# Patient Record
Sex: Male | Born: 1935 | Race: White | Hispanic: No | Marital: Married | Smoking: Former smoker
Health system: Southern US, Community
[De-identification: ages and names within clinical notes are randomized; demographics above are authoritative.]

## PROBLEM LIST (undated history)

## (undated) DIAGNOSIS — L930 Discoid lupus erythematosus: Secondary | ICD-10-CM

## (undated) DIAGNOSIS — E663 Overweight: Secondary | ICD-10-CM

## (undated) DIAGNOSIS — G5710 Meralgia paresthetica, unspecified lower limb: Secondary | ICD-10-CM

## (undated) DIAGNOSIS — N200 Calculus of kidney: Secondary | ICD-10-CM

## (undated) DIAGNOSIS — H9190 Unspecified hearing loss, unspecified ear: Secondary | ICD-10-CM

## (undated) DIAGNOSIS — I2510 Atherosclerotic heart disease of native coronary artery without angina pectoris: Secondary | ICD-10-CM

## (undated) DIAGNOSIS — R2689 Other abnormalities of gait and mobility: Secondary | ICD-10-CM

## (undated) DIAGNOSIS — I10 Essential (primary) hypertension: Secondary | ICD-10-CM

## (undated) DIAGNOSIS — E782 Mixed hyperlipidemia: Secondary | ICD-10-CM

## (undated) DIAGNOSIS — N400 Enlarged prostate without lower urinary tract symptoms: Secondary | ICD-10-CM

## (undated) DIAGNOSIS — H269 Unspecified cataract: Secondary | ICD-10-CM

## (undated) DIAGNOSIS — I350 Nonrheumatic aortic (valve) stenosis: Secondary | ICD-10-CM

## (undated) DIAGNOSIS — R42 Dizziness and giddiness: Secondary | ICD-10-CM

## (undated) DIAGNOSIS — E119 Type 2 diabetes mellitus without complications: Secondary | ICD-10-CM

## (undated) DIAGNOSIS — I639 Cerebral infarction, unspecified: Secondary | ICD-10-CM

## (undated) DIAGNOSIS — E1142 Type 2 diabetes mellitus with diabetic polyneuropathy: Secondary | ICD-10-CM

## (undated) HISTORY — PX: TONSILLECTOMY: SUR1361

## (undated) HISTORY — PX: BACK SURGERY: SHX140

## (undated) HISTORY — PX: TOTAL KNEE ARTHROPLASTY: SHX125

## (undated) HISTORY — DX: Overweight: E66.3

## (undated) HISTORY — DX: Benign prostatic hyperplasia without lower urinary tract symptoms: N40.0

## (undated) HISTORY — DX: Mixed hyperlipidemia: E78.2

## (undated) HISTORY — DX: Meralgia paresthetica, unspecified lower limb: G57.10

## (undated) HISTORY — DX: Other abnormalities of gait and mobility: R26.89

## (undated) HISTORY — DX: Unspecified hearing loss, unspecified ear: H91.90

## (undated) HISTORY — DX: Unspecified cataract: H26.9

## (undated) HISTORY — PX: LESION REMOVAL: SHX5196

## (undated) HISTORY — DX: Type 2 diabetes mellitus with diabetic polyneuropathy: E11.42

## (undated) HISTORY — DX: Cerebral infarction, unspecified: I63.9

## (undated) HISTORY — PX: JOINT REPLACEMENT: SHX530

## (undated) HISTORY — PX: LITHOTRIPSY: SUR834

## (undated) HISTORY — DX: Discoid lupus erythematosus: L93.0

## (undated) HISTORY — DX: Essential (primary) hypertension: I10

## (undated) HISTORY — DX: Dizziness and giddiness: R42

## (undated) HISTORY — PX: CORONARY ANGIOPLASTY WITH STENT PLACEMENT: SHX49

## (undated) HISTORY — DX: Nonrheumatic aortic (valve) stenosis: I35.0

## (undated) HISTORY — DX: Atherosclerotic heart disease of native coronary artery without angina pectoris: I25.10

## (undated) HISTORY — DX: Calculus of kidney: N20.0

---

## 1995-10-18 DIAGNOSIS — G5710 Meralgia paresthetica, unspecified lower limb: Secondary | ICD-10-CM

## 1995-10-18 HISTORY — DX: Meralgia paresthetica, unspecified lower limb: G57.10

## 1995-10-18 HISTORY — PX: CORONARY ARTERY BYPASS GRAFT: SHX141

## 1995-10-18 HISTORY — PX: INGUINAL HERNIA REPAIR: SUR1180

## 2003-07-04 ENCOUNTER — Ambulatory Visit (HOSPITAL_COMMUNITY)
Admission: RE | Admit: 2003-07-04 | Discharge: 2003-07-04 | Payer: Self-pay | Admitting: Physical Medicine and Rehabilitation

## 2004-07-09 ENCOUNTER — Encounter: Admission: RE | Admit: 2004-07-09 | Discharge: 2004-07-09 | Payer: Self-pay | Admitting: Rheumatology

## 2004-08-31 ENCOUNTER — Inpatient Hospital Stay (HOSPITAL_BASED_OUTPATIENT_CLINIC_OR_DEPARTMENT_OTHER): Admission: RE | Admit: 2004-08-31 | Discharge: 2004-08-31 | Payer: Self-pay | Admitting: Interventional Cardiology

## 2004-10-17 HISTORY — PX: LUMBAR LAMINECTOMY: SHX95

## 2004-11-15 ENCOUNTER — Encounter: Admission: RE | Admit: 2004-11-15 | Discharge: 2004-11-15 | Payer: Self-pay | Admitting: Neurological Surgery

## 2004-12-15 ENCOUNTER — Inpatient Hospital Stay (HOSPITAL_COMMUNITY): Admission: RE | Admit: 2004-12-15 | Discharge: 2004-12-16 | Payer: Self-pay | Admitting: Neurological Surgery

## 2005-03-07 ENCOUNTER — Encounter: Admission: RE | Admit: 2005-03-07 | Discharge: 2005-03-07 | Payer: Self-pay | Admitting: Neurological Surgery

## 2005-06-30 ENCOUNTER — Encounter: Admission: RE | Admit: 2005-06-30 | Discharge: 2005-06-30 | Payer: Self-pay | Admitting: Internal Medicine

## 2005-11-02 ENCOUNTER — Encounter: Admission: RE | Admit: 2005-11-02 | Discharge: 2005-11-02 | Payer: Self-pay | Admitting: Orthopedic Surgery

## 2005-11-04 ENCOUNTER — Inpatient Hospital Stay (HOSPITAL_COMMUNITY): Admission: RE | Admit: 2005-11-04 | Discharge: 2005-11-08 | Payer: Self-pay | Admitting: Orthopedic Surgery

## 2005-12-08 ENCOUNTER — Ambulatory Visit (HOSPITAL_COMMUNITY): Admission: RE | Admit: 2005-12-08 | Discharge: 2005-12-08 | Payer: Self-pay | Admitting: Rheumatology

## 2007-10-30 ENCOUNTER — Ambulatory Visit: Payer: Self-pay | Admitting: Hematology and Oncology

## 2007-11-09 LAB — CBC WITH DIFFERENTIAL/PLATELET
BASO%: 0.4 % (ref 0.0–2.0)
Basophils Absolute: 0 10*3/uL (ref 0.0–0.1)
EOS%: 3.4 % (ref 0.0–7.0)
HCT: 36.5 % — ABNORMAL LOW (ref 38.7–49.9)
HGB: 12.6 g/dL — ABNORMAL LOW (ref 13.0–17.1)
LYMPH%: 18.8 % (ref 14.0–48.0)
MCH: 34.4 pg — ABNORMAL HIGH (ref 28.0–33.4)
MCHC: 34.5 g/dL (ref 32.0–35.9)
MCV: 99.7 fL — ABNORMAL HIGH (ref 81.6–98.0)
MONO%: 8 % (ref 0.0–13.0)
NEUT%: 69.4 % (ref 40.0–75.0)

## 2007-11-14 LAB — COMPREHENSIVE METABOLIC PANEL
ALT: 12 U/L (ref 0–53)
AST: 15 U/L (ref 0–37)
Albumin: 4.2 g/dL (ref 3.5–5.2)
Alkaline Phosphatase: 36 U/L — ABNORMAL LOW (ref 39–117)
Calcium: 9.1 mg/dL (ref 8.4–10.5)
Chloride: 102 mEq/L (ref 96–112)
Creatinine, Ser: 0.93 mg/dL (ref 0.40–1.50)
Potassium: 4.6 mEq/L (ref 3.5–5.3)

## 2007-11-14 LAB — PROTEIN ELECTROPHORESIS, SERUM
Albumin ELP: 63.6 % (ref 55.8–66.1)
Alpha-2-Globulin: 9.3 % (ref 7.1–11.8)
Beta 2: 4.7 % (ref 3.2–6.5)
Beta Globulin: 5.7 % (ref 4.7–7.2)

## 2007-11-14 LAB — D-DIMER, QUANTITATIVE: D-Dimer, Quant: 0.5 ug/mL-FEU — ABNORMAL HIGH (ref 0.00–0.48)

## 2007-11-26 ENCOUNTER — Ambulatory Visit (HOSPITAL_COMMUNITY): Admission: RE | Admit: 2007-11-26 | Discharge: 2007-11-26 | Payer: Self-pay | Admitting: Hematology and Oncology

## 2007-11-30 LAB — CBC WITH DIFFERENTIAL/PLATELET
BASO%: 0.3 % (ref 0.0–2.0)
Basophils Absolute: 0 10*3/uL (ref 0.0–0.1)
HCT: 37.3 % — ABNORMAL LOW (ref 38.7–49.9)
HGB: 12.9 g/dL — ABNORMAL LOW (ref 13.0–17.1)
LYMPH%: 23.7 % (ref 14.0–48.0)
MCH: 34.5 pg — ABNORMAL HIGH (ref 28.0–33.4)
MCHC: 34.5 g/dL (ref 32.0–35.9)
MONO#: 0.5 10*3/uL (ref 0.1–0.9)
NEUT%: 63.2 % (ref 40.0–75.0)
Platelets: 129 10*3/uL — ABNORMAL LOW (ref 145–400)
WBC: 5.7 10*3/uL (ref 4.0–10.0)
lymph#: 1.4 10*3/uL (ref 0.9–3.3)

## 2008-03-26 ENCOUNTER — Encounter: Admission: RE | Admit: 2008-03-26 | Discharge: 2008-03-26 | Payer: Self-pay | Admitting: Rheumatology

## 2008-04-22 ENCOUNTER — Ambulatory Visit: Payer: Self-pay | Admitting: Hematology and Oncology

## 2008-04-25 LAB — CBC WITH DIFFERENTIAL/PLATELET
BASO%: 0.4 % (ref 0.0–2.0)
Eosinophils Absolute: 0.2 10*3/uL (ref 0.0–0.5)
HCT: 35.7 % — ABNORMAL LOW (ref 38.7–49.9)
LYMPH%: 23.1 % (ref 14.0–48.0)
MONO#: 0.5 10*3/uL (ref 0.1–0.9)
NEUT#: 3.1 10*3/uL (ref 1.5–6.5)
NEUT%: 62.5 % (ref 40.0–75.0)
Platelets: 122 10*3/uL — ABNORMAL LOW (ref 145–400)
WBC: 5 10*3/uL (ref 4.0–10.0)
lymph#: 1.2 10*3/uL (ref 0.9–3.3)

## 2008-10-22 ENCOUNTER — Ambulatory Visit: Payer: Self-pay | Admitting: Hematology and Oncology

## 2008-10-24 LAB — CBC WITH DIFFERENTIAL/PLATELET
BASO%: 0.3 % (ref 0.0–2.0)
EOS%: 3.6 % (ref 0.0–7.0)
Eosinophils Absolute: 0.2 10*3/uL (ref 0.0–0.5)
MCHC: 34.3 g/dL (ref 32.0–35.9)
MCV: 100.7 fL — ABNORMAL HIGH (ref 81.6–98.0)
MONO%: 9.1 % (ref 0.0–13.0)
NEUT#: 3.1 10*3/uL (ref 1.5–6.5)
RBC: 3.59 10*6/uL — ABNORMAL LOW (ref 4.20–5.71)
RDW: 12.6 % (ref 11.2–14.6)

## 2008-10-24 LAB — PROTHROMBIN TIME: INR: 1 (ref 0.0–1.5)

## 2008-10-24 LAB — COMPREHENSIVE METABOLIC PANEL
AST: 15 U/L (ref 0–37)
Alkaline Phosphatase: 35 U/L — ABNORMAL LOW (ref 39–117)
BUN: 20 mg/dL (ref 6–23)
Creatinine, Ser: 0.96 mg/dL (ref 0.40–1.50)
Glucose, Bld: 96 mg/dL (ref 70–99)
Total Bilirubin: 0.7 mg/dL (ref 0.3–1.2)

## 2008-10-24 LAB — IVY BLEEDING TIME: Bleeding Time: 5.5 Minutes (ref 2.0–8.0)

## 2009-01-05 ENCOUNTER — Ambulatory Visit (HOSPITAL_COMMUNITY): Admission: RE | Admit: 2009-01-05 | Discharge: 2009-01-05 | Payer: Self-pay | Admitting: Urology

## 2009-04-22 ENCOUNTER — Ambulatory Visit: Payer: Self-pay | Admitting: Hematology and Oncology

## 2009-04-24 LAB — COMPREHENSIVE METABOLIC PANEL
ALT: 10 U/L (ref 0–53)
Albumin: 4.1 g/dL (ref 3.5–5.2)
Alkaline Phosphatase: 33 U/L — ABNORMAL LOW (ref 39–117)
CO2: 28 mEq/L (ref 19–32)
Glucose, Bld: 142 mg/dL — ABNORMAL HIGH (ref 70–99)
Potassium: 5 mEq/L (ref 3.5–5.3)
Sodium: 142 mEq/L (ref 135–145)
Total Protein: 6.4 g/dL (ref 6.0–8.3)

## 2009-04-24 LAB — CBC WITH DIFFERENTIAL/PLATELET
Eosinophils Absolute: 0.2 10*3/uL (ref 0.0–0.5)
MONO#: 0.5 10*3/uL (ref 0.1–0.9)
MONO%: 8.6 % (ref 0.0–14.0)
NEUT#: 3.7 10*3/uL (ref 1.5–6.5)
RBC: 3.62 10*6/uL — ABNORMAL LOW (ref 4.20–5.82)
RDW: 13.2 % (ref 11.0–14.6)
WBC: 5.4 10*3/uL (ref 4.0–10.3)

## 2009-11-23 ENCOUNTER — Ambulatory Visit: Payer: Self-pay | Admitting: Hematology and Oncology

## 2009-11-25 LAB — BASIC METABOLIC PANEL
BUN: 23 mg/dL (ref 6–23)
Chloride: 105 mEq/L (ref 96–112)
Glucose, Bld: 81 mg/dL (ref 70–99)
Potassium: 4.5 mEq/L (ref 3.5–5.3)

## 2009-11-25 LAB — CBC WITH DIFFERENTIAL/PLATELET
Basophils Absolute: 0 10*3/uL (ref 0.0–0.1)
Eosinophils Absolute: 0.3 10*3/uL (ref 0.0–0.5)
HCT: 36.6 % — ABNORMAL LOW (ref 38.4–49.9)
HGB: 12.6 g/dL — ABNORMAL LOW (ref 13.0–17.1)
MCV: 102.1 fL — ABNORMAL HIGH (ref 79.3–98.0)
MONO%: 8.4 % (ref 0.0–14.0)
NEUT#: 4.5 10*3/uL (ref 1.5–6.5)
Platelets: 125 10*3/uL — ABNORMAL LOW (ref 140–400)
RDW: 13.2 % (ref 11.0–14.6)

## 2010-08-16 ENCOUNTER — Ambulatory Visit: Payer: Self-pay | Admitting: Hematology and Oncology

## 2010-08-18 LAB — CBC WITH DIFFERENTIAL/PLATELET
BASO%: 0.1 % (ref 0.0–2.0)
EOS%: 3.6 % (ref 0.0–7.0)
LYMPH%: 16.1 % (ref 14.0–49.0)
MCHC: 34.7 g/dL (ref 32.0–36.0)
MCV: 101.1 fL — ABNORMAL HIGH (ref 79.3–98.0)
MONO%: 8.3 % (ref 0.0–14.0)
Platelets: 149 10*3/uL (ref 140–400)
RBC: 3.65 10*6/uL — ABNORMAL LOW (ref 4.20–5.82)
WBC: 7 10*3/uL (ref 4.0–10.3)

## 2010-08-18 LAB — BASIC METABOLIC PANEL
Calcium: 9.4 mg/dL (ref 8.4–10.5)
Sodium: 140 mEq/L (ref 135–145)

## 2011-01-16 DIAGNOSIS — I639 Cerebral infarction, unspecified: Secondary | ICD-10-CM

## 2011-01-16 HISTORY — DX: Cerebral infarction, unspecified: I63.9

## 2011-01-27 LAB — GLUCOSE, CAPILLARY: Glucose-Capillary: 129 mg/dL — ABNORMAL HIGH (ref 70–99)

## 2011-03-04 NOTE — Cardiovascular Report (Signed)
NAMEEMAAD, NANNA NO.:  [id number]   MEDICAL RECORD NO.:  [id number]          PATIENT TYPE:  OIB   LOCATION:  6501                         FACILITY:  [facility]   PHYSICIAN:  Lyn Records III, M.D.DATE OF BIRTH:  1921/01/18   DATE OF PROCEDURE:  08/31/2004  DATE OF DISCHARGE:                              CARDIAC CATHETERIZATION   INDICATIONS FOR PROCEDURE:  The patient has history of coronary artery  bypass grafting in 1997 where he received a sequential saphenous vein graft  to the first and second diagonal, sequential saphenous vein graft to the  first and second obtuse marginals, saphenous vein graft to the PDA, and LIMA  to the LAD.  The LIMA graft was placed distally.  Since September of 2004,  the patient has had recurring episodes of angina.  A stress last September  was negative for evidence of ischemia.  This past September on several  occasions while walking between destinations of [place], [place] he  developed tightness in his chest that was relieved with rest.  He has had no  rest pain.  This study is being done to define graft status and to rule out  progression of native disease.   OPERATION/PROCEDURE:  1.  Left heart catheterization.  2.  Selective coronary angiography.  3.  Left ventriculography.  4.  Saphenous vein graft angiography.  5.  Left internal mammary artery graft angiography.   DESCRIPTION OF PROCEDURE:  After informed consent, a 4-French sheath was  placed in the right femoral artery using to modified Seldinger technique.  A  4-French B2 multipurpose catheter was used to hemodynamic recordings, left  ventriculography by hand injection, and selective native right coronary  angiography as well as saphenous vein graft angiography.  We used a 4-French  left coronary catheter for left coronary angiography and a #4 internal  mammary artery catheter for left internal mammary artery angiography as well  as saphenous vein graft  angiography.  The patient tolerated the procedure  without complications.   RESULTS:  1.  Hemodynamic data.      1.  Aortic pressure 132/68.      2.  Left ventricular pressure 132/13 mmHg.   1.  Left ventriculography:  Left ventricular is normal in size and      demonstrates normal overall contractility.  Ejection fraction is greater      than 60%.   1.  Coronary angiography.      1.  Left main coronary:  The left main is widely patent.  There is no          evidence of stenosis.      2.  Left anterior descending coronary artery:  The LAD is occluded in          the mid vessel beyond the first diagonal and the second septal          perforator.  The first diagonal is also totally occluded.  The          diagonals fill by saphenous vein graft and LAD fills distally by  LIMA graft.      3.  Circumflex artery:  Circumflex is basically occluded in the mid          vessel.  No significant obtuse marginal branches seen to arise          distally.      4.  Right coronary artery:  The right coronary contains a 50-70% mid          vessel stenosis, 80-85% distal narrowing before the PDA.          Comparative flow was noted with the saphenous vein graft.   1.  Bypass graft angiography.      1.  Saphenous vein graft sequential to the first and second diagonals          widely patent.      2.  Saphenous vein graft to the first and second obtuse marginals widely          patent.      3.  Saphenous vein graft to the PDA widely patent.   1.  LIMA to the LAD.  Described as widely patent to the mid to distal LAD.   CONCLUSIONS:  1.  The patient has significant native vessel coronary disease with total      occlusion on the mid circumflex, left to left collaterals to a small      obtuse marginal branch that is occluded.  The LAD is totally occluded in      the mid vessel.  First diagonal vessel is totally occluded and the      distal right coronary contains a 70-90% stenosis before the  PDA.  There      is also moderate mid RCA disease.   1.  Normal left ventricular function.   1.  Widely patent saphenous vein graft as outlined above.   1.  Widely patent left internal mammary artery.   PLAN/COMMENTS:  The patient may be having angina perhaps from the small  obtuse marginal supplied by collaterals.  There is certainly no evidence of  bypass graft occlusive disease.  Have had no sizable regions of myocardium  at risk.  Continue risk factor modification and medical therapy,  nitroglycerin if needed for chest discomfort.  The patient is to call with  clinical problems.  Follow up to check groin in one to two weeks.       HWS/MEDQ  D:  08/31/2004  T:  08/31/2004  Job:  [id number]

## 2011-03-04 NOTE — Discharge Summary (Signed)
[name], Barry NO.:  [id number]   MEDICAL RECORD NO.:  [id number]          PATIENT TYPE:  INP   LOCATION:  5015                         FACILITY:  [facility]   PHYSICIAN:  Dyke Brackett, M.D.    DATE OF BIRTH:  1921-02-28   DATE OF ADMISSION:  11/04/2005  DATE OF DISCHARGE:  11/08/2005                                 DISCHARGE SUMMARY   ADMISSION DIAGNOSES:  1.  Bilateral knee osteoarthritis, left knee pain greater than right.  2.  Hypertension.  3.  Coronary artery disease status post coronary artery bypass grafting.  4.  Hyperlipidemia.  5.  Diabetes mellitus, type 2.  6.  Benign prostatic hypertrophy.  7.  Degenerative disk disease.   DISCHARGE DIAGNOSES:  1.  Status post left total knee angioplasty.  2.  Right knee osteoarthritis.  3.  Hypertension.  4.  Coronary artery disease status post coronary artery bypass grafting.  5.  Hyperlipidemia.  6.  Diabetes mellitus, type 2.  7.  Benign prostatic hypertrophy.  8.  Degenerative disk disease.  9.  Acute blood loss anemia secondary to surgery requiring 2 units packed      red blood cells.  10. Postoperative nausea, resolved.  11. Erythema about the left knee incision, placed on empiric antibiotics.   HISTORY OF PRESENT ILLNESS:  Gregory Barry is a pleasant 77 year old white male  with bilateral knee pain, currently left knee pain worse than right.  The  patient states he has had left knee pain for the past 3 to 5 years.  No  known injury.  The patient only endures pain when walking or standing.  The  pain is described as a dull pain with occasional sharp pain that radiates up  and down the leg.  No assistive devices being used.  The patient does have  latent pain.  Mechanical symptoms positive for catching and giving away.  The patient has failed conservative treatment which includes injections.  The patient was admitted on November 04, 2005 and underwent a left total knee  angioplasty.   ALLERGIES:   SPORNAX causes rash.   SURGICAL PROCEDURE:  The patient was taken to the operating room on November 04, 2005, by Dr. Lacretia Nicks. Dava Najjar and Legrand Pitts. Duffy, P.A.-C.  The patient was  placed under general anesthesia and received supplemental femoral nerve  block and then underwent a left total knee angioplasty.  The following  components were used, a large femoral component, large patella, three-peg,  size 4 tibia with a 12.5 mm bearing.  The patient tolerated the procedure  well and returned to recovery in good and stable condition.   CONSULTATIONS:  The following consults were obtained while patient was  hospitalized, PT, OT, case management.   HOSPITAL COURSE:  On postop day #1, the patient's T-max of 100.2, otherwise  vital signs stable.  Hemoglobin and hematocrit 9.4, and 27.3, respectively.  Patient asymptomatic in regards to knee.  No shortness of breath, chest  pain, calf pain.  Tolerating diet well.  Pain under control.  Later that day  after morning rounds, the patient became hypotensive and very dizzy with  standing, sitting in chair with physical therapy.  Later that evening, the  patient was able to ambulate 15 feet with rolling walker, still with some  dizziness.   On postop day #2, patient with no dizziness, no chest pain, no shortness of  breath, calf pain.  Tolerating diet. Some nausea that seemed to be  resolving. T-max was 99.3.  The patient was tachycardic at 111, respiratory  rate 20, blood pressure 118/58.  O2 saturation 96 to 98% on room air.  Hemoglobin 8.2, hematocrit 22.9.  The patient was transfused with 2 units of  packed red blood cells as seemed to be symptomatic from acute blood loss  anemia secondary to surgery.   On postop day #3, patient still with some nausea, no vomiting.  Denies chest  pain, shortness of breath, dizziness. No bowel movement.  Pain well  controlled.  Patient voiced concerns over being discharged home due to his  nausea and recent  dizziness.  He also had concerns about equipment needs  being met.  T-max was 100.8.  Blood pressure 112/58, heart rate 108,  respiratory rate 20, 94% on room air.  Hemoglobin 9.9, hematocrit 28.6.  The  patient was started on iron due to acute blood loss anemia.  The patient  later that afternoon progressed well with physical therapy, walked 200 feet  with rolling walker self, able to take 3 steps with minimal assistance.   On postop 3#4, the patient was with no complains. Pain under really good  control.  Positive bowel movement. Nausea resolved.  T-max 100.7, blood  pressure 123/64, heart rate 91, respiratory rate 20.  No new labs.  The  patient with some erythema about the incision; therefore, empirically was  placed on Keflex 500 mg 1 p.o. 4 times a day x7 days.  Otherwise, patient  doing well, progressed well with physical therapy, and was ready for  discharge to home.   LABORATORY DATA:  Routine labs on admission: CBC dated October 31, 2005,  white count 5600, hemoglobin 13.3, hematocrit 38.2, platelets 135.  Coags on  admission: All values within normal limits. Routine chemistries on  admission: All values within normal limits.  Hepatic enzymes on admission:  All values within normal limits. Hemoglobin A1c on November 05, 2005, was  5.9.  Urinalysis was negative.  Urine culture negative on admission.   EKG dated November 04, 2005, showed sinus bradycardia with heart rate 57  beats per minute, PR interval 174 msec, __________ 674.  Unconfirmed  inferior infarct, age indeterminate.   X-rays: Two-view portable knee dated November 04, 2005, showed no evidence of  perioperative complications involving left total knee replacement.   Head CT dated November 02, 2005, showed the area of question on chest x-ray  corresponds to somewhat elongated left ventricle with no significant  abnormality. No mass or adenopathy seen.  Single 4 mm non-calcified nodule in the right middle lobe with  calcified granuloma in the left lobe.  The  nodule on the right middle lobe is most likely benign, but if the patient is  high risk, followup CT in one year may be warranted.  Prior CABG.   Two-view chest x-ray dated October 31, 2005, showed double density noted in  the region of the left heart border, question scar related to patient's  prior CABG.  Would recommend chest CT to completely exclude mass.   DISCHARGE INSTRUCTIONS:   DISCHARGE MEDICATIONS:  The  patient may resume preop medications except for  no aspirin while on Arixtra.  The patient is not to take hydrocodone while  taking Percocet.  The following medications were added.  1.  Arixtra 2.5 mg subcutaneously at 8 p.m., last dose on November 10, 2005.      The patient is to resume aspirin on November 11, 2005.  2.  Percocet 5/325 one to two tablets every 4 to 6 hours for pain, #50 with      no refills.  3.  Robaxin 500 mg 1 tablet every 6 to 8 hours for spasm, #30 with no      refills.  4.  Iron 325 mg 1 tablet daily x28 days.  5.  Keflex 500 mg 1 p.o. 4 times a day x7 days.   DIET:  No restrictions.   ACTIVITY:  The patient is 50% weightbearing on the left leg with crutches.   WOUND CARE:  The patient is to keep wound clean and dry.  Change dressing  daily.  Call office if there is a temperature greater than 101.5, foul-  smelling drainage, or pain not well controlled.   FOLLOW UP:  The patient needs followup with Dr. Madelon Lips in the office  approximately 10 days from discharge. The patient is to call the office at  [phone] to make appointment.   SPECIAL INSTRUCTIONS:  CPM 0 to 98 degrees 6 to 8 hours a day; increase by  10 degrees daily.  Home health PT per [facility].   CONDITION ON DISCHARGE:  The patient was discharged to home in good and  stable condition.      Richardean Canal, Arnetha Courser, M.D.  Electronically Signed    GC/MEDQ  D:  11/08/2005  T:  11/08/2005  Job:  [id number]   cc:   Demetria Pore.  Coral Spikes, M.D.  Fax: [phone]

## 2011-03-04 NOTE — Op Note (Signed)
NAMETRAY, KLAYMAN NO.:  [id number]   MEDICAL RECORD NO.:  [id number]          PATIENT TYPE:  INP   LOCATION:  2899                         FACILITY:  [facility]   PHYSICIAN:  Dyke Brackett, M.D.    DATE OF BIRTH:  [birth year]   DATE OF PROCEDURE:  11/04/2005  DATE OF DISCHARGE:                                 OPERATIVE REPORT   PREOPERATIVE DIAGNOSIS:  Osteoarthritis with varus deformity and flexion  contracture of the left knee.   POSTOPERATIVE DIAGNOSIS:  Osteoarthritis with varus deformity and flexion  contracture of the left knee.   OPERATION:  Left LCS total knee replacement (cemented large femur, large  patella, size 4 tibia with 12.5 mm bearing).   SURGEON:  Dyke Brackett, M.D.   ASSISTANT:  Legrand Pitts. Duffy, P.A.-C.   TOURNIQUET TIME:  1 hour 40 minutes.   DESCRIPTION OF PROCEDURE:  Straight skin incision after exsanguination of  the leg, inflation of the tourniquet to 350.  Medial parapatellar approach  to the knee.  Cutting of the distal femur with the tibial cutting jig about  2 mm below the most diseased medial compartment.  Anterior posterior femoral  cut was made for the flexion gap to eventually equal the extension gap at  12.5 mm with a 4 degree valgus cut.  The distal femoral cut on 2-3 occasions  to get rid of the flexion contracture which was severe as well as complete  release of the PCL and the soft tissues in the posterior aspect of the knee.  Anterior, posterior femoral chamfer cuts were made followed by the keel  holes for the femoral prosthesis.  Attention was turned to the femur, excess  meniscus were removed from the knee.  The size 4 tibia seen to fill the  tibial surface well.  The keel hole was cut or the tibia followed by a  trial.  Initially, there was a mismatch somewhat with a lack of full  extension but with good stability of 12.5 mm.  For this reason, revision  cuts were made on the femur to make the flexion extension gap  equal at 12.5  mm with excellent stability obtained in flexion and extension.  With the  trial the patella is cut leaving about 14-15 mm patella for a three peg  patella.  The final components were inserted with the cement in the doughy  state.  Tibia followed by femur and patella.  Excess cement was allowed to  harden and was removed.  Final stability was checked, range of motion was  excellent.  No tendency for bearing spin out and range of motion 0  to about  120 degrees.  Closure was effected with interrupted Ethibond, 2-0 Vicryl,  and skin clips.  Marcaine with epinephrine in the skin.  Lightly compressive  sterile dressing applied.      Dyke Brackett, M.D.  Electronically Signed     WDC/MEDQ  D:  11/04/2005  T:  11/04/2005  Job:  [id number]

## 2011-03-04 NOTE — H&P (Signed)
NAMEFREMON, ZACHARIA NO.:  [id number]   MEDICAL RECORD NO.:  [id number]          PATIENT TYPE:  INP   LOCATION:  NA                           FACILITY:  [facility]   PHYSICIAN:  Dyke Brackett, M.D.    DATE OF BIRTH:  1920-12-04   DATE OF ADMISSION:  DATE OF DISCHARGE:                                HISTORY & PHYSICAL   CHIEF COMPLAINT:  Left knee pain.   HISTORY OF PRESENT ILLNESS:  Mr. Raygoza is a pleasant 77 year old male with  bilateral knee pain, currently left knee pain worse than right.  He has had  left knee pain for the past three to five years.  No known injury.  Patient  only endures pain with walking or standing.  Pain is described as a dull  pain with occasional sharp pains that radiate up and down the left leg.  He  uses no assistive devices to ambulate.  He does have waking pain.  Mechanical symptoms positive for catching and giving way.  Patient has  failed conservative treatment which include Hyalgan injections.  Patient is  scheduled for a left total knee arthroplasty on November 04, 2005 by Dr.  Madelon Lips at [facility].   ALLERGIES:  [facility] causes rash.   MEDICATIONS:  1.  Altace 10 mg one p.o. q.a.m.  2.  Glimepiride 4 mg one p.o. q.a.m.  3.  Omega 3 1200 one q.a.m.  4.  Multivitamin one p.o. q.a.m.  5.  Glucosamine chondroitin 1000/1200 two tablets p.o. at lunch.  6.  Toprol XL 50 mg one p.o. at supper.  7.  Glimepiride 4 mg one p.o. at supper.  8.  Actoplus 15/500 one p.o. at supper.  9.  Omega 3 1200 one p.o. at supper.  10. Doxazosin 4 mg one p.o. q.h.s.  11. Zocor 20 mg one p.o. q.h.s.  12. Enteric-coated aspirin 500 mg one p.o. q.h.s.  Patient is to stop on      October 30, 2005.  13. Colace q.h.s.  14. Citrucel q.h.s.  15. Nicotine mist one in the a.m. so it should go up in the a.m.  16. Januvia 100 mg one p.o. q.a.m.   PAST MEDICAL HISTORY:  1.  Hypertension.  2.  Coronary artery disease status post CABG.  3.   Hyperlipidemia.  4.  Diabetes mellitus type 2.  5.  BPH.  6.  Degenerative disk disease of the lumbar spine.   PAST SURGICAL HISTORY:  1.  Cardiac bypass, Dr. Tyrone Sage 1997.  2.  Inguinal hernia repair, Dr. Derrell Lolling 1997.  3.  Low back surgery, Dr. Marikay Alar in 2006.   Patient denies any complications with the above procedures and has had no  blood transfusions to his knowledge.   SOCIAL HISTORY:  Patient denies any tobacco use.  He uses alcohol  occasionally.  He is married.  Has two grown children.  Lives in a one-story  home with three steps to the usual entrance.  He is a retired Doctor, general practice.  Primary care physician is Dr. Lennox Pippins (phone number  [phone]  [phone]).   FAMILY HISTORY:  Patient's mother deceased age 76 due to stroke.  She had a  history of hypertension.  Father deceased age 12 with heart failure.  Has  one deceased sister who died as an infant and a brother who died of  pneumonia at age 31.   REVIEW OF SYSTEMS:  Patient denies any recent cold, cough, fever, flu-like  symptoms.  Denies any chest pain.  He does have shortness of breath with  exertion after three to four blocks, but no PND or orthopnea.  Wears glasses  at all times.  Has decreased hearing right ear.  Suffers from chronic  constipation, otherwise no GI symptoms.  Has BPH and nocturia x1.  Has a  living will and a power of attorney, Earle Gell Polimeni.  Otherwise, review of  systems negative or noncontributory.   PHYSICAL EXAMINATION:  GENERAL:  Patient is a well-developed, well-nourished  male in no acute distress.  Patient does have obvious varus deformity of  both knees.  He walks without any assistance of a cane or walker.  Patient's  mood and affect appropriate.  Talks easily with examiner.  VITAL SIGNS:  Patient's temperature is 96.8 degrees Fahrenheit, blood  pressure 110/58, pulse is 66, respiratory rate 16.  CARDIAC:  Regular rate and rhythm.  No murmurs, rubs, or gallops noted.   CHEST:  No wheezing, rhonchi, or rales noted on auscultation.  Patient does  have a well healed surgical incision midline from previous CABG.  ABDOMEN:  Soft, nontender.  Bowel sounds throughout.  No hepatomegaly or  splenomegaly noted.  BREASTS:  Deferred at this time.  GENITOURINARY:  Deferred at this time.  RECTAL:  Deferred at this time.  BACK:  Patient has no tenderness with palpation over thoracic and lumbar  spine.  NECK:  Trachea is midline.  No lymphadenopathy.  Carotids are 2+ without  bruits.  Patient has no tenderness with palpation along the cervical spine  and has full range of motion of cervical spine.  NEUROLOGIC:  Patient is alert and oriented x3.  Cranial nerves II-XII  grossly intact.  Deep tendon reflexes 1+ at the knees and ankles  bilaterally.  Patient has 5/5 strength throughout the lower extremities  except with the left leg extension against resistance reveals weakness 4/5  strength.  HEENT:  Head is normocephalic, atraumatic without frontal or maxillary sinus  tenderness to palpation.  Conjunctiva is pink.  Sclera is non-icteric.  PERRLA.  EOMs are intact.  No visible external ear deformities.  TM on the  left is obscured due to heavy cerumen.  The right TM shows some scarring.  Nose and nasal septum midline.  Nasal mucosa is pink, moist, without polyps.  Buccal mucosa is pink and moist.  Patient has good dentition.  Pharynx  without exudate or erythema.  MUSCULOSKELETAL:  Upper extremities:  Patient has full range of motion of  the upper extremities without pain.  Upper extremities are equal and  symmetric in size and shape.  Radial pulses are 2+ bilaterally.  Lower  extremities:  Patient has full range of motion of both hips without pain,  flexion of both hips to 90 degrees causes no discomfort.  Right knee 0-105  degrees of flexion, approximately 5-6 degree varus deformity.  No effusion. no edema is noted.  He has no joint line tenderness.  Anterior drawer  is  negative.  Valgus and varus stressing reveals no laxity.  Left knee 0-105  degrees of  flex, approximately 5 degree varus deformity.  No effusion.  No  edema.  He has no tenderness with palpation along the medial and lateral  joint line.  Valgus and varus stressing reveals no laxity.  Anterior drawer  is negative.  Lower extremities are non-edematous.  Dorsal and pedal pulses  are 2+ bilaterally and patient has good sensation to light touch in the toes  throughout.   X-RAYS:  X-rays of the right knee dated August 09, 2005 shows near bone-on-  bone in medial compartment with tricompartmental changes, calcification of  the popliteal artery noted.  Left knee also dated August 09, 2005:  Bone-on-  bone medial compartment, tricompartmental changes, calcification of the  popliteal artery.   IMPRESSION:  1.  Bilateral knee osteoarthritis, left knee pain greater than right.  2.  Hypertension.  3.  Coronary artery disease status post coronary artery bypass graft.  4.  Hyperlipidemia.  5.  Diabetes mellitus type 2.  6.  Benign prostatic hypertrophy.  7.  Degenerative disk disease lumbar spine.   PLAN:  Patient is to undergo all preoperative laboratories and testing prior  to undergoing a left total knee arthroplasty by Dr. Madelon Lips on November 04, 2005.  Patient did get preoperative clearance from Dr. Coral Spikes prior to  surgery.  From Dr. Katina Degree note Dr. [name] saw the patient and no further  cardiac evaluation prior to his upcoming surgery was deemed necessary.  Patient did undergo a stress test in September 2006 which was negative for  ischemia.  Good functional evaluation.  If patient had not developed any  chest pain prior to surgery then it was deemed he was clear from a cardiac  standpoint.  Patient will speak with Dr. Talmage Nap prior to surgery to see if  there are any recommendations on his diabetes mellitus prior to surgery.      Richardean Canal, Arnetha Courser,  M.D.  Electronically Signed    GC/MEDQ  D:  10/26/2005  T:  10/26/2005  Job:  [id number]

## 2011-03-08 NOTE — Op Note (Signed)
Gregory, Barry NO.:  [id number]   MEDICAL RECORD NO.:  [id number]          PATIENT TYPE:  INP   LOCATION:                               FACILITY:  [facility]   PHYSICIAN:  Tia Alert, MD     DATE OF BIRTH:  May 14, 1921   DATE OF PROCEDURE:  12/15/2004  DATE OF DISCHARGE:                                 OPERATIVE REPORT   PREOPERATIVE DIAGNOSIS:  Severe spinal stenosis L3-4 and L4-5 with grade 1  spondylolisthesis L3-4 and L4-5 with degenerative disease and neurogenic  claudication.   PREOPERATIVE DIAGNOSIS:  Severe spinal stenosis L3-4 and L4-5 with grade 1  spondylolisthesis L3-4 and L4-5 with degenerative disease and neurogenic  claudication.   PROCEDURE:  1.  Decompressive lumbar hemilaminectomy, medial facetectomy, and      foraminotomy L3-4 and L4-5 on the left with sublaminar decompression.  2.  Posterolateral fusion L3-L5 utilizing locally harvested morselized      autologous bone graft mixed with Actifuse.   SURGEON:  Dr. Marikay Alar.   ASSISTANT:  Dr. Karene Fry.   ANESTHESIA:  General endotracheal.   COMPLICATIONS:  None apparent.   INDICATIONS FOR PROCEDURE:  [name] is an 77 year old white male with  multiple medical problems who is referred with neurogenic claudication. He  had done well with an epidural steroid injection, but had progressive pain  in his legs with ambulation. He had an MRI which showed severe spinal  stenosis at L3-4 and L4-5 with a grade 1 spondylolisthesis at each level. He  had very minimal back pain, but did have neurogenic claudication. Because of  his advanced age, multiple medical problems and lack of back pain. I did not  want to perform an instrumented fusion on him, but I was worried that a  decompression may worsen his spondylolisthesis; therefore, I recommended a  lumbar decompression from one side with a sublaminar decompression followed  by noninstrumented fusion. He understood the risks, benefits, and  expected  outcome and wished to proceed with the procedure.   DESCRIPTION OF PROCEDURE:  The patient was taken to the operating room and  after induction of adequate generalized endotracheal anesthesia he was  rolled into prone position on the Wilson frame and all pressure points were  padded. His lumbar region was prepped with DuraPrep and then draped in the  usual sterile fashion. 8 cc of local anesthesia was injected and then a  dorsal midline incision was made and carried down to the lumbosacral fascia.  The fascia was opened in the paraspinous musculature was taken down in a  subperiosteal fashion to expose the L3-4, L4-5 interspaces bilaterally. He  had quite a bit of facet overgrowth.  Intraoperative x-ray confirmed my  level; and then used the Kerrison punch and high speed drill, a  hemilaminectomy, medial facetectomy, and foraminotomy was performed at L4-5  on the left side. The yellow ligament was identified, opened, and removed to  expose the underlying dura. The L5 nerve root was identified and I dissected  out to the medial pedicle wall and performed  a generous foraminotomy.   Once this done, I used the drill to drill up under the spinous process and  then performed a sublaminar decompression using the Kerrison punch to  decompress into the right lateral recess and identify the right pedicle and  L5 nerve root.  Once that decompression was completed, I irrigated with  saline solution, lined the dura with Gelfoam and then turned my attention to  the L3-4 level.  I used the Kerrison punch; again, and the drill to perform  a hemilaminectomy, medial facetectomy, and foraminotomy at L3-4 on the left  side; and then followed by sublaminar decompression at L3-4.  Both lateral  recesses were decompressed, both pedicles were identified as well as the L4  nerve root.   Once the decompression was complete I irrigated with saline solution,  allowed the dura with Gelfoam.  I then used a  high-speed drill to drill the  lamina complex of L3, L4, and L5 and placed a mixture of local autograft and  Actifuse out over this for a noninstrumented posterolateral fusion.  I then  removed the retractor, closed the fascia with interrupted #1 Vicryl, closed  the subcutaneous and subcuticular tissues with 2-0 and 3-0 Vicryl, and  closed the skin with Benzoin Steri-Strips. The drapes were removed. A  sterile dressing was applied. The patient was awakened from general  anesthesia and transported to the recovery room in stable condition.  At the  end of the procedure all sponge, needle, and sponge counts were correct.      DSJ/MEDQ  D:  12/15/2004  T:  12/15/2004  Job:  [id number]

## 2011-07-25 ENCOUNTER — Encounter (HOSPITAL_COMMUNITY)
Admission: RE | Admit: 2011-07-25 | Discharge: 2011-07-25 | Disposition: A | Discharge status: Home / Self Care | Payer: Medicare Other | Source: Ambulatory Visit | Attending: Orthopedic Surgery | Admitting: Orthopedic Surgery

## 2011-07-25 ENCOUNTER — Other Ambulatory Visit (HOSPITAL_COMMUNITY): Payer: Self-pay | Admitting: Orthopedic Surgery

## 2011-07-25 DIAGNOSIS — I10 Essential (primary) hypertension: Secondary | ICD-10-CM

## 2011-07-25 LAB — URINALYSIS, ROUTINE W REFLEX MICROSCOPIC
Glucose, UA: NEGATIVE mg/dL
Ketones, ur: NEGATIVE mg/dL
Leukocytes, UA: NEGATIVE
Protein, ur: NEGATIVE mg/dL

## 2011-07-25 LAB — CBC
Hemoglobin: 12.5 g/dL — ABNORMAL LOW (ref 13.0–17.0)
MCH: 33.6 pg (ref 26.0–34.0)
MCHC: 33.9 g/dL (ref 30.0–36.0)
Platelets: 128 10*3/uL — ABNORMAL LOW (ref 150–400)
RDW: 12.5 % (ref 11.5–15.5)

## 2011-07-25 LAB — DIFFERENTIAL
Basophils Relative: 0 % (ref 0–1)
Eosinophils Absolute: 0.2 10*3/uL (ref 0.0–0.7)
Eosinophils Relative: 3 % (ref 0–5)
Monocytes Absolute: 0.7 10*3/uL (ref 0.1–1.0)
Monocytes Relative: 10 % (ref 3–12)

## 2011-07-25 LAB — COMPREHENSIVE METABOLIC PANEL
BUN: 25 mg/dL — ABNORMAL HIGH (ref 6–23)
CO2: 32 mEq/L (ref 19–32)
Calcium: 10.4 mg/dL (ref 8.4–10.5)
Chloride: 103 mEq/L (ref 96–112)
Creatinine, Ser: 0.93 mg/dL (ref 0.50–1.35)
GFR calc Af Amer: 84 mL/min — ABNORMAL LOW (ref >90)
GFR calc non Af Amer: 73 mL/min — ABNORMAL LOW (ref >90)
Total Bilirubin: 0.6 mg/dL (ref 0.3–1.2)

## 2011-07-25 LAB — SURGICAL PCR SCREEN
MRSA, PCR: NEGATIVE
Staphylococcus aureus: POSITIVE — AB

## 2011-07-25 LAB — TYPE AND SCREEN: Antibody Screen: NEGATIVE

## 2011-07-25 LAB — PROTIME-INR
INR: 1.02 (ref 0.00–1.49)
Prothrombin Time: 13.6 seconds (ref 11.6–15.2)

## 2011-07-26 LAB — URINE CULTURE

## 2011-07-29 ENCOUNTER — Inpatient Hospital Stay (HOSPITAL_COMMUNITY): Payer: Medicare Other

## 2011-07-29 ENCOUNTER — Inpatient Hospital Stay (HOSPITAL_COMMUNITY)
Admission: RE | Admit: 2011-07-29 | Discharge: 2011-08-01 | DRG: 470 | Disposition: A | Discharge status: Home Health | Payer: Medicare Other | Source: Ambulatory Visit | Attending: Orthopedic Surgery | Admitting: Orthopedic Surgery

## 2011-07-29 DIAGNOSIS — I2510 Atherosclerotic heart disease of native coronary artery without angina pectoris: Secondary | ICD-10-CM | POA: Diagnosis present

## 2011-07-29 DIAGNOSIS — Z951 Presence of aortocoronary bypass graft: Secondary | ICD-10-CM

## 2011-07-29 DIAGNOSIS — E119 Type 2 diabetes mellitus without complications: Secondary | ICD-10-CM | POA: Diagnosis present

## 2011-07-29 DIAGNOSIS — D62 Acute posthemorrhagic anemia: Secondary | ICD-10-CM | POA: Diagnosis not present

## 2011-07-29 DIAGNOSIS — E785 Hyperlipidemia, unspecified: Secondary | ICD-10-CM | POA: Diagnosis present

## 2011-07-29 DIAGNOSIS — Z7982 Long term (current) use of aspirin: Secondary | ICD-10-CM

## 2011-07-29 DIAGNOSIS — Z01812 Encounter for preprocedural laboratory examination: Secondary | ICD-10-CM

## 2011-07-29 DIAGNOSIS — N400 Enlarged prostate without lower urinary tract symptoms: Secondary | ICD-10-CM | POA: Diagnosis present

## 2011-07-29 DIAGNOSIS — Z79899 Other long term (current) drug therapy: Secondary | ICD-10-CM

## 2011-07-29 DIAGNOSIS — Z96659 Presence of unspecified artificial knee joint: Secondary | ICD-10-CM

## 2011-07-29 DIAGNOSIS — Z87891 Personal history of nicotine dependence: Secondary | ICD-10-CM

## 2011-07-29 DIAGNOSIS — Z01818 Encounter for other preprocedural examination: Secondary | ICD-10-CM

## 2011-07-29 DIAGNOSIS — I10 Essential (primary) hypertension: Secondary | ICD-10-CM | POA: Diagnosis present

## 2011-07-29 DIAGNOSIS — M1710 Unilateral primary osteoarthritis, unspecified knee: Principal | ICD-10-CM | POA: Diagnosis present

## 2011-07-29 HISTORY — PX: TOTAL KNEE ARTHROPLASTY: SHX125

## 2011-07-29 LAB — GLUCOSE, CAPILLARY
Glucose-Capillary: 146 mg/dL — ABNORMAL HIGH (ref 70–99)
Glucose-Capillary: 148 mg/dL — ABNORMAL HIGH (ref 70–99)

## 2011-07-30 LAB — BASIC METABOLIC PANEL
Chloride: 105 mEq/L (ref 96–112)
Creatinine, Ser: 0.8 mg/dL (ref 0.50–1.35)
GFR calc Af Amer: 90 mL/min — ABNORMAL LOW (ref >90)
GFR calc non Af Amer: 77 mL/min — ABNORMAL LOW (ref >90)
Potassium: 4 mEq/L (ref 3.5–5.1)

## 2011-07-30 LAB — GLUCOSE, CAPILLARY
Glucose-Capillary: 163 mg/dL — ABNORMAL HIGH (ref 70–99)
Glucose-Capillary: 180 mg/dL — ABNORMAL HIGH (ref 70–99)
Glucose-Capillary: 146 mg/dL — ABNORMAL HIGH (ref 70–99)

## 2011-07-30 LAB — CBC
MCHC: 33.7 g/dL (ref 30.0–36.0)
Platelets: 107 10*3/uL — ABNORMAL LOW (ref 150–400)
RDW: 12.6 % (ref 11.5–15.5)
WBC: 7.8 10*3/uL (ref 4.0–10.5)

## 2011-07-31 LAB — BASIC METABOLIC PANEL
BUN: 11 mg/dL (ref 6–23)
Chloride: 105 mEq/L (ref 96–112)
Creatinine, Ser: 0.76 mg/dL (ref 0.50–1.35)
GFR calc Af Amer: >90 mL/min (ref >90)
Glucose, Bld: 167 mg/dL — ABNORMAL HIGH (ref 70–99)
Potassium: 4.3 mEq/L (ref 3.5–5.1)

## 2011-07-31 LAB — GLUCOSE, CAPILLARY
Glucose-Capillary: 159 mg/dL — ABNORMAL HIGH (ref 70–99)
Glucose-Capillary: 176 mg/dL — ABNORMAL HIGH (ref 70–99)

## 2011-07-31 LAB — CBC
HCT: 24.9 % — ABNORMAL LOW (ref 39.0–52.0)
Hemoglobin: 8.5 g/dL — ABNORMAL LOW (ref 13.0–17.0)
MCV: 100 fL (ref 78.0–100.0)
RDW: 12.7 % (ref 11.5–15.5)
WBC: 9.1 10*3/uL (ref 4.0–10.5)

## 2011-08-01 LAB — CBC
MCH: 33.7 pg (ref 26.0–34.0)
MCHC: 34 g/dL (ref 30.0–36.0)
MCV: 99.2 fL (ref 78.0–100.0)
Platelets: 112 10*3/uL — ABNORMAL LOW (ref 150–400)
RBC: 2.43 MIL/uL — ABNORMAL LOW (ref 4.22–5.81)
RDW: 12.6 % (ref 11.5–15.5)

## 2011-08-01 LAB — GLUCOSE, CAPILLARY
Glucose-Capillary: 168 mg/dL — ABNORMAL HIGH (ref 70–99)
Glucose-Capillary: 129 mg/dL — ABNORMAL HIGH (ref 70–99)

## 2011-08-01 LAB — BASIC METABOLIC PANEL
CO2: 28 mEq/L (ref 19–32)
Calcium: 8.7 mg/dL (ref 8.4–10.5)
Creatinine, Ser: 0.81 mg/dL (ref 0.50–1.35)
GFR calc non Af Amer: 77 mL/min — ABNORMAL LOW (ref >90)
Sodium: 139 mEq/L (ref 135–145)

## 2011-08-02 NOTE — Op Note (Signed)
Gregory Barry, Gregory Barry NO.:  [id number]  MEDICAL RECORD NO.:  [id number]  LOCATION:  2550                         FACILITY:  [facility]  PHYSICIAN:  Dyke Brackett, M.D.    DATE OF BIRTH:  05-29-1921  DATE OF PROCEDURE:  07/29/2011 DATE OF DISCHARGE:                              OPERATIVE REPORT   INDICATIONS:  This is an 77 year old male with severe knee arthritis, incapacitating pain, not responding to conservative, felt to be amenable to inpatient hospitalization, right total knee replacement.  PREOPERATIVE DIAGNOSIS:  Osteoarthritis, right knee with varus deformity and flexion contracture.  POSTOPERATIVE DIAGNOSIS:  Osteoarthritis, right knee with varus deformity and flexion contracture.  OPERATION:  Right total knee replacement (sigma size 4 femur, tibia 10- mm bearing, 38-mm patella).  SURGEON:  Dyke Brackett, MD  FIRST ASSISTANT:  Jones Broom, PA  SECOND ASSISTANT:  Laural Benes. Su Hilt, PA  TOURNIQUET TIME:  1 hour and 15 minutes.  ANESTHESIA:  General anesthetic with local supplementation.  DESCRIPTION OF PROCEDURE:  She was prepped in the supine position with a tourniquet.  Straight skin incision made.  Parapatellar approach to the knee made.  We noted a varus deformity in flexion contracture, stripped the medial side aggressively to relax the collateral ligament medially. I resected 11-mm of bone on the distal femur due to flexion contracture. I then cut 10 mm below the most diseased lateral compartment on the tibial side.  We balanced the extension gap at 10 mm with symmetry between the medial and lateral sides.  Attention was next directed at the 5 and 1 cutting block.  We placed the guide after sizing the femur to be a size 4.  We then placed the guide with the appropriate jig, set the rotation using anterior referencing of the femur and then cut this, flushed with the metaphyseal bone of the tibia.  External rotation was set by the jig and  a 10-mm spacer was used.  The 5 and 1cutting block was next inserted over these pins with anterior and posterior chamfer cuts utilized, we stripped off soft tissue and some bone from the posterior aspect of the knee and measured the flexion gap symmetric to the extension at 10 mm.  Tibia was prepared with a tibial keel punch and reamer, placed a trial tibial block all over the keel mechanism and then we placed the finishing guide for the box cut on the femur.  The box cut was made for the femur followed by trial being placed on the femur tibia with a 10-mm bearing.  Patella was cut leaving 17 cm of native patella with replacement of 38-mm 3-peg all- poly patellar trial.  The trial components all were inserted with range of motion being in acceptable 0 to about 120.  Full extension, no tendency for varus and valgus instability, no flexion instability, and no tendency for bearing spin out was noted.  The trials were removed.  The bony surfaces were irrigated.  Then we inserted the final prosthesis with the exception of the bearing, Cement was allowed to harden.  Before releasing the tourniquet, we checked for any excess cement in the posterior aspect  of the knee and found none. Tourniquet was released, no excessive bleeding was noted in the posterior aspect of the knee.  The final bearing was placed and again parameters checked.  As mentioned above, everything was acceptable. Hemovac drain was placed exiting superolaterally and we closed the capsule after infiltrating with Marcaine with interrupted Ethibond and 2- 0 Vicryl and skin clips.  Taken to the recovery room in stable condition.     Dyke Brackett, M.D.     WDC/MEDQ  D:  07/29/2011  T:  07/29/2011  Job:  [id number]  Electronically Signed by W. Jhamal Plucinski M.D. on 08/02/2011 11:05:20 AM

## 2011-09-01 NOTE — Discharge Summary (Signed)
[name], [name] NO.:  [id number]  MEDICAL RECORD NO.:  [id number]  LOCATION:  5030                         FACILITY:  [facility]  PHYSICIAN:  Dyke Brackett, M.D.    DATE OF BIRTH:  [birth year]  DATE OF ADMISSION:  07/29/2011 DATE OF DISCHARGE:  08/01/2011                              DISCHARGE SUMMARY   DIAGNOSIS:  End-stage osteoarthritis, right knee.  SECONDARY DIAGNOSES UPON ADMISSION: 1. Diabetes. 2. Coronary artery disease. 3. Hypertension. 4. Hyperlipidemia. 5. Benign prostatic hypertrophy.  HISTORY OF PRESENT ILLNESS:  Gregory Barry is an 77 year old male with a several-year history of bilateral knee pain secondary to osteoarthritis. He underwent successful left total knee arthroplasty in 2007.  Pain in the right knee is now increasing, preventing him from sleeping, trouble with ADLs, trouble with safe ambulation, and transfers with assist.  He failed conservative treatment with viscosupplementation, NSAIDs, pain medications, rest, PT, OA unloader brace, gait aid.  Risks and benefits of right knee total knee replacement were discussed and the patient wished to proceed with all preoperative labs and medical clearance within acceptable limits.  On the date of hospitalization, the patient was taken to the operating room and he underwent a right total knee replacement using DePuy knee.  The patient was placed on perioperative antibiotics, postoperative Lovenox for DVT prophylaxis.  He tolerated the procedure well, was taken to the recovery room in stable condition, then later transferred up to the nursing unit.  On postoperative day #1, he was up with physical therapy, also worked with occupational therapy, allowed weightbearing as tolerated with the right lower extremity.  His Foley catheter was discharged.  Postop day #2, mild-to-moderate pain was controlled with p.o. pain medication.  His dressing was changed.  His Hemovac drain was pulled.  Had some mild  blood loss anemia, mobility, and strength as well as range of motion, doing well with physical therapy.  Postoperative day #3, the patient's pain was still controlled with p.o. pain medications.  His dressing was changed again.  Wound was clean, dry, and intact.  Staples in place.  He was ambulating with the walker with physical therapy.  He was using a CPM machine up to 6 hours a day.  He is urinating without difficulty and had positive flatus.  He was discharged home with home health physical therapy.  He is to continue to use a CPM machine at home.  He will follow up with Dr. Candise Bowens office in 10-14 days.  Shower on postoperative day #5, using soap and water over the incision, and then covered with the clean, dry dressing.  He will continue his Lovenox injections b.i.d. for DVT prophylaxis for 2 weeks postop.  He was told of signs of infection or DVT to watch for and to contact to our office with any problem.  DISCHARGE MEDICATIONS:  He was instructed to continue home meds, also gave him a prescriptions for Percocet and Robaxin with his Lovenox.  DIAGNOSES AT DISCHARGE:  End-stage osteoarthritis, right knee.  SECONDARY DIAGNOSES: 1. Diabetes. 2. Coronary artery disease. 3. Hypertension. 4. Hyperlipidemia. 5. Benign prostatic hypertrophy. 6. Acute blood loss anemia.    ______________________________ Ivin Booty  Xiadani Damman, PA-C   ______________________________ Dyke Brackett, M.D.    JC/MEDQ  D:  08/31/2011  T:  08/31/2011  Job:  [id number]

## 2012-01-11 ENCOUNTER — Ambulatory Visit
Admission: RE | Admit: 2012-01-11 | Discharge: 2012-01-11 | Disposition: A | Discharge status: Home / Self Care | Payer: Medicare Other | Source: Ambulatory Visit | Attending: Internal Medicine | Admitting: Internal Medicine

## 2012-01-11 ENCOUNTER — Other Ambulatory Visit: Payer: Self-pay | Admitting: Internal Medicine

## 2012-01-11 DIAGNOSIS — R269 Unspecified abnormalities of gait and mobility: Secondary | ICD-10-CM

## 2012-09-27 IMAGING — CR DG CHEST 2V
2 series · 2 of 2 positions shown · non-contrast
Comparison: 08/31/2006

CLINICAL DATA: Preop for right knee replacement.  Hypertensive.

CHEST - 2 VIEW

[view not recorded (1 of 2)]
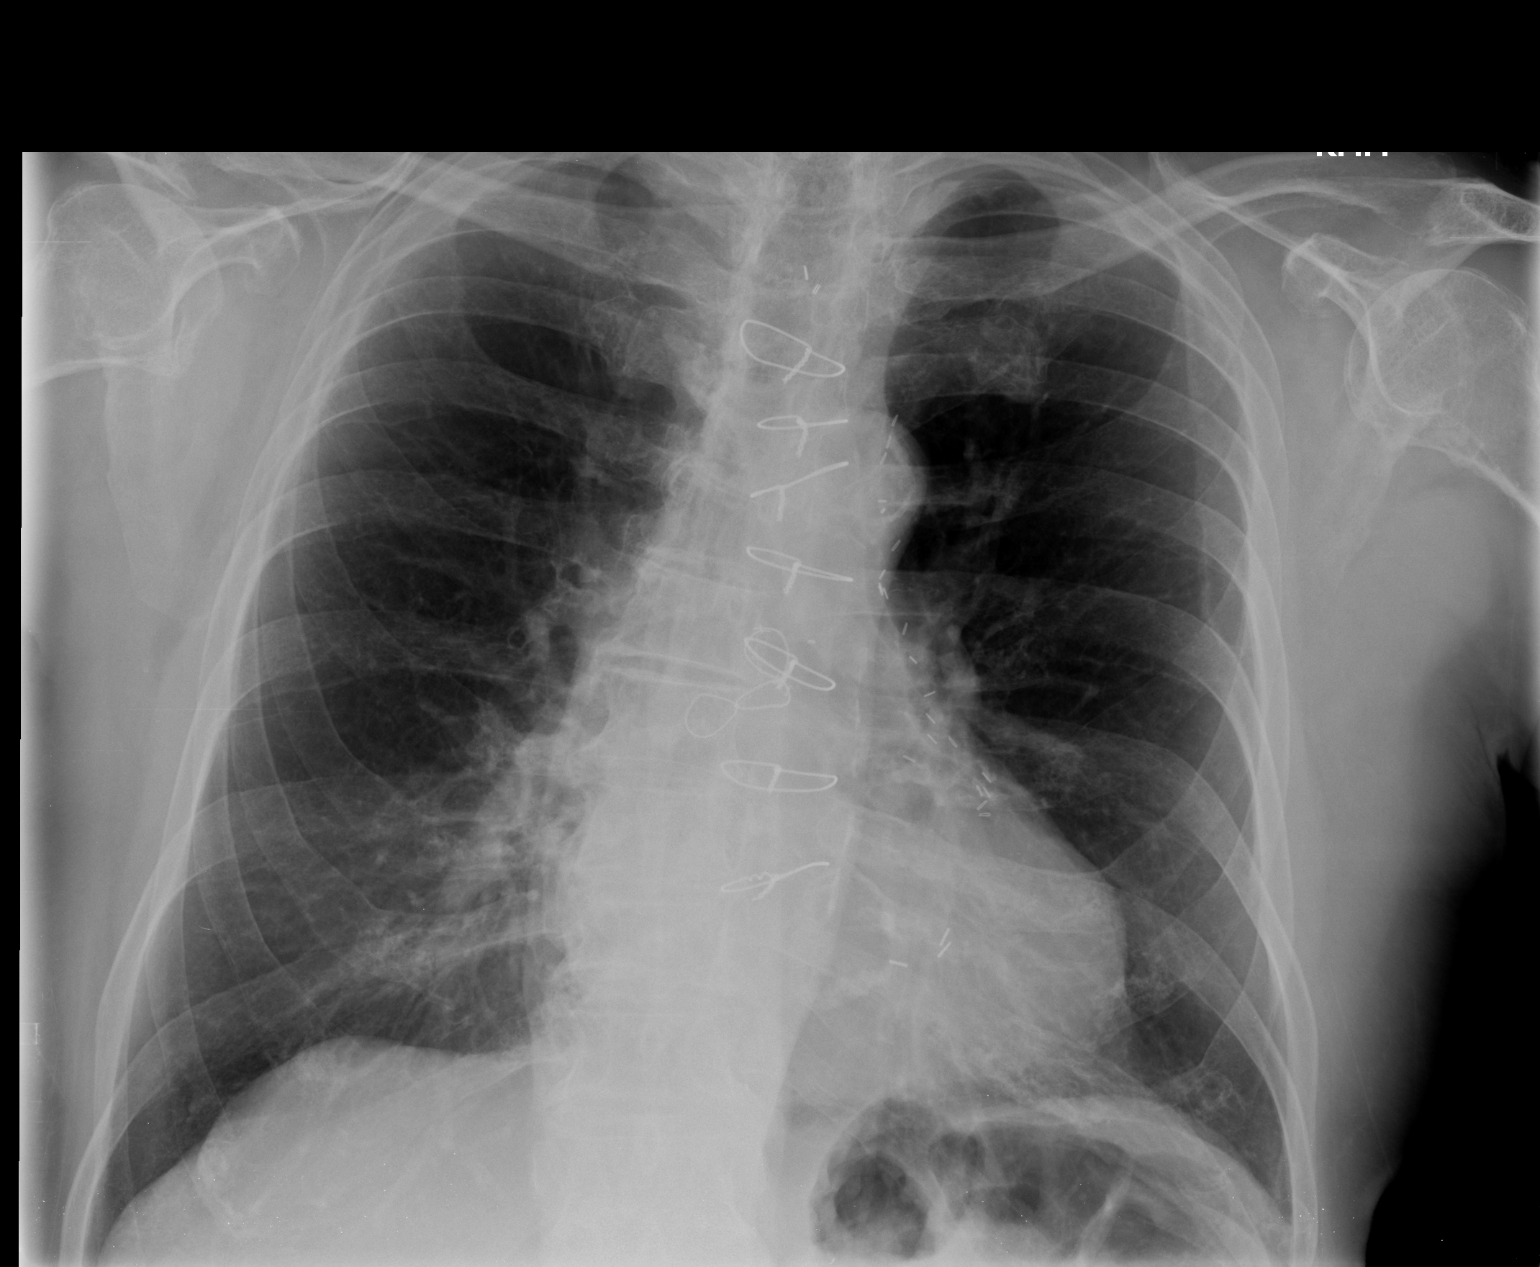

[view not recorded (2 of 2)]
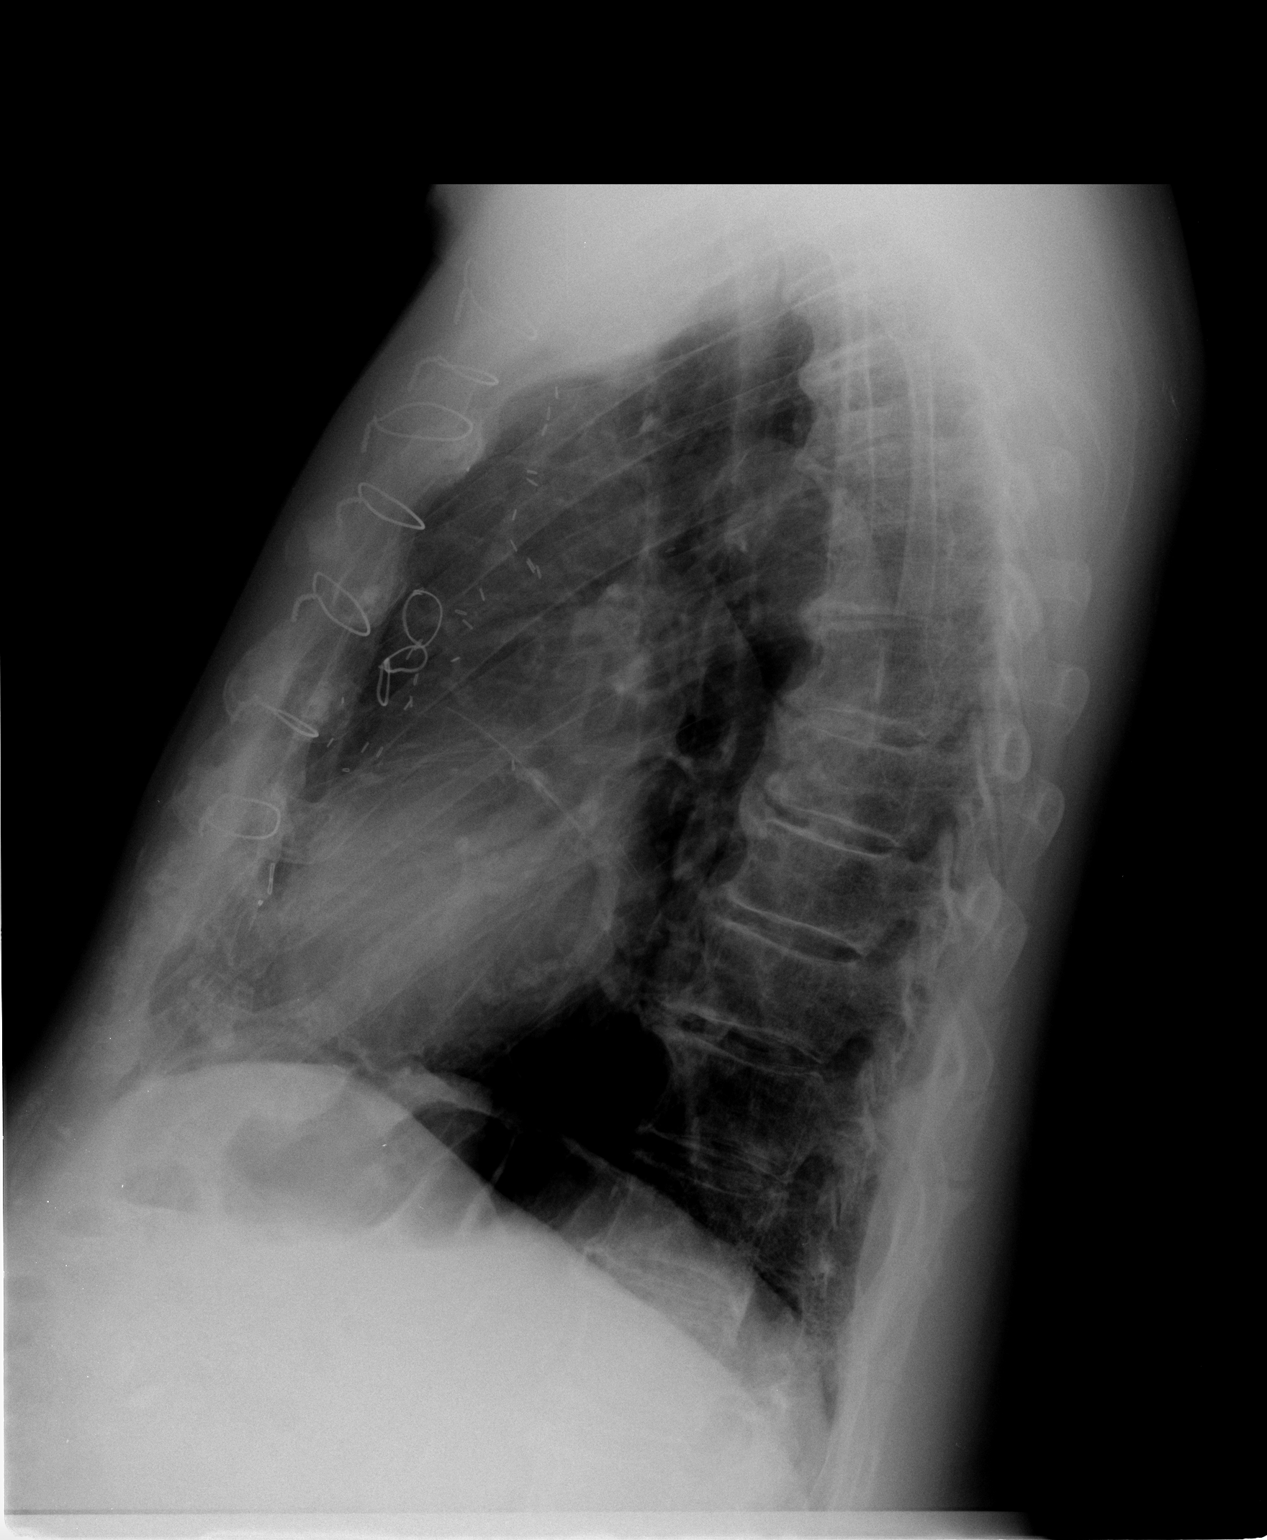

[2 of 2 positions shown; findings below may reference images not displayed]

FINDINGS: Moderate thoracic spondylosis. Prior median sternotomy.
Midline trachea.  Borderline cardiomegaly.  Aortic atherosclerosis.
No pleural effusion or pneumothorax.  No congestive failure.

Clear lungs.
IMPRESSION: No acute cardiopulmonary disease.

## 2012-10-01 IMAGING — CR DG KNEE 1-2V PORT*R*
2 series · 2 of 2 positions shown · non-contrast
Comparison: None.

CLINICAL DATA: Postop right knee replacement.

PORTABLE RIGHT KNEE - 1-2 VIEW

[view not recorded (1 of 2)]
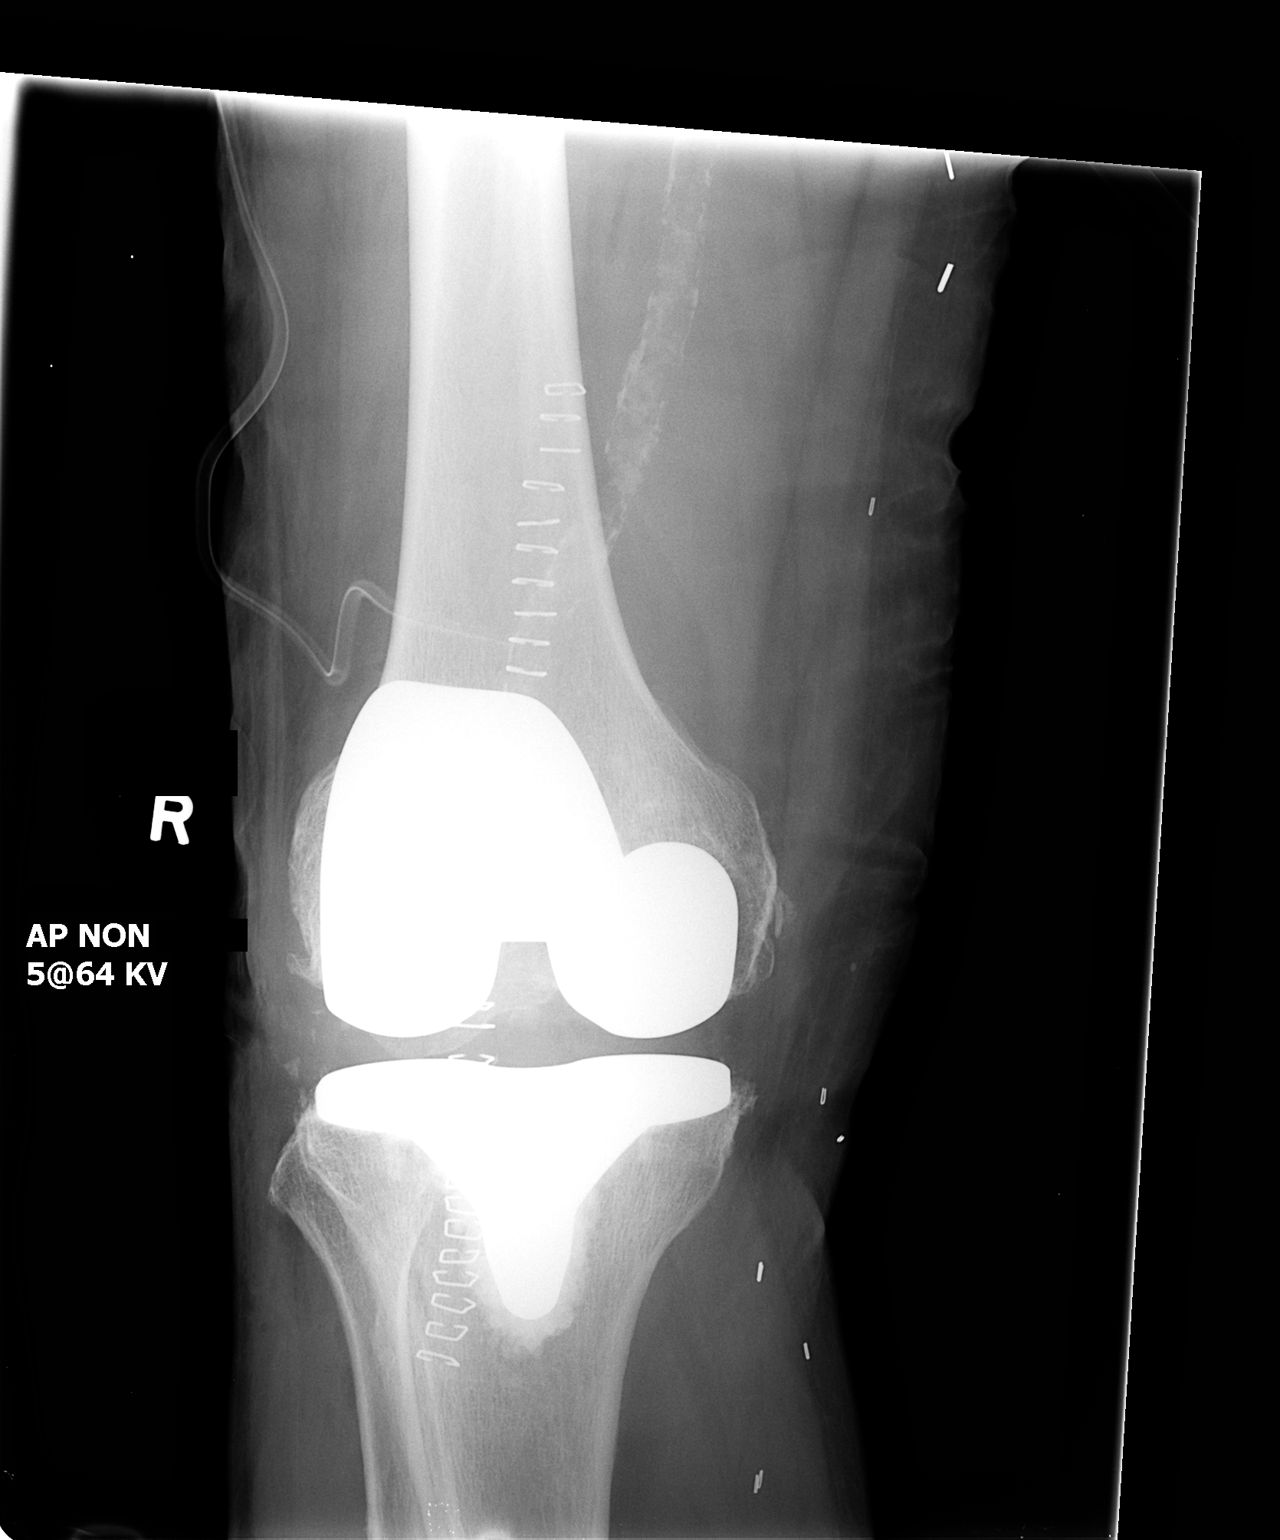

[view not recorded (2 of 2)]
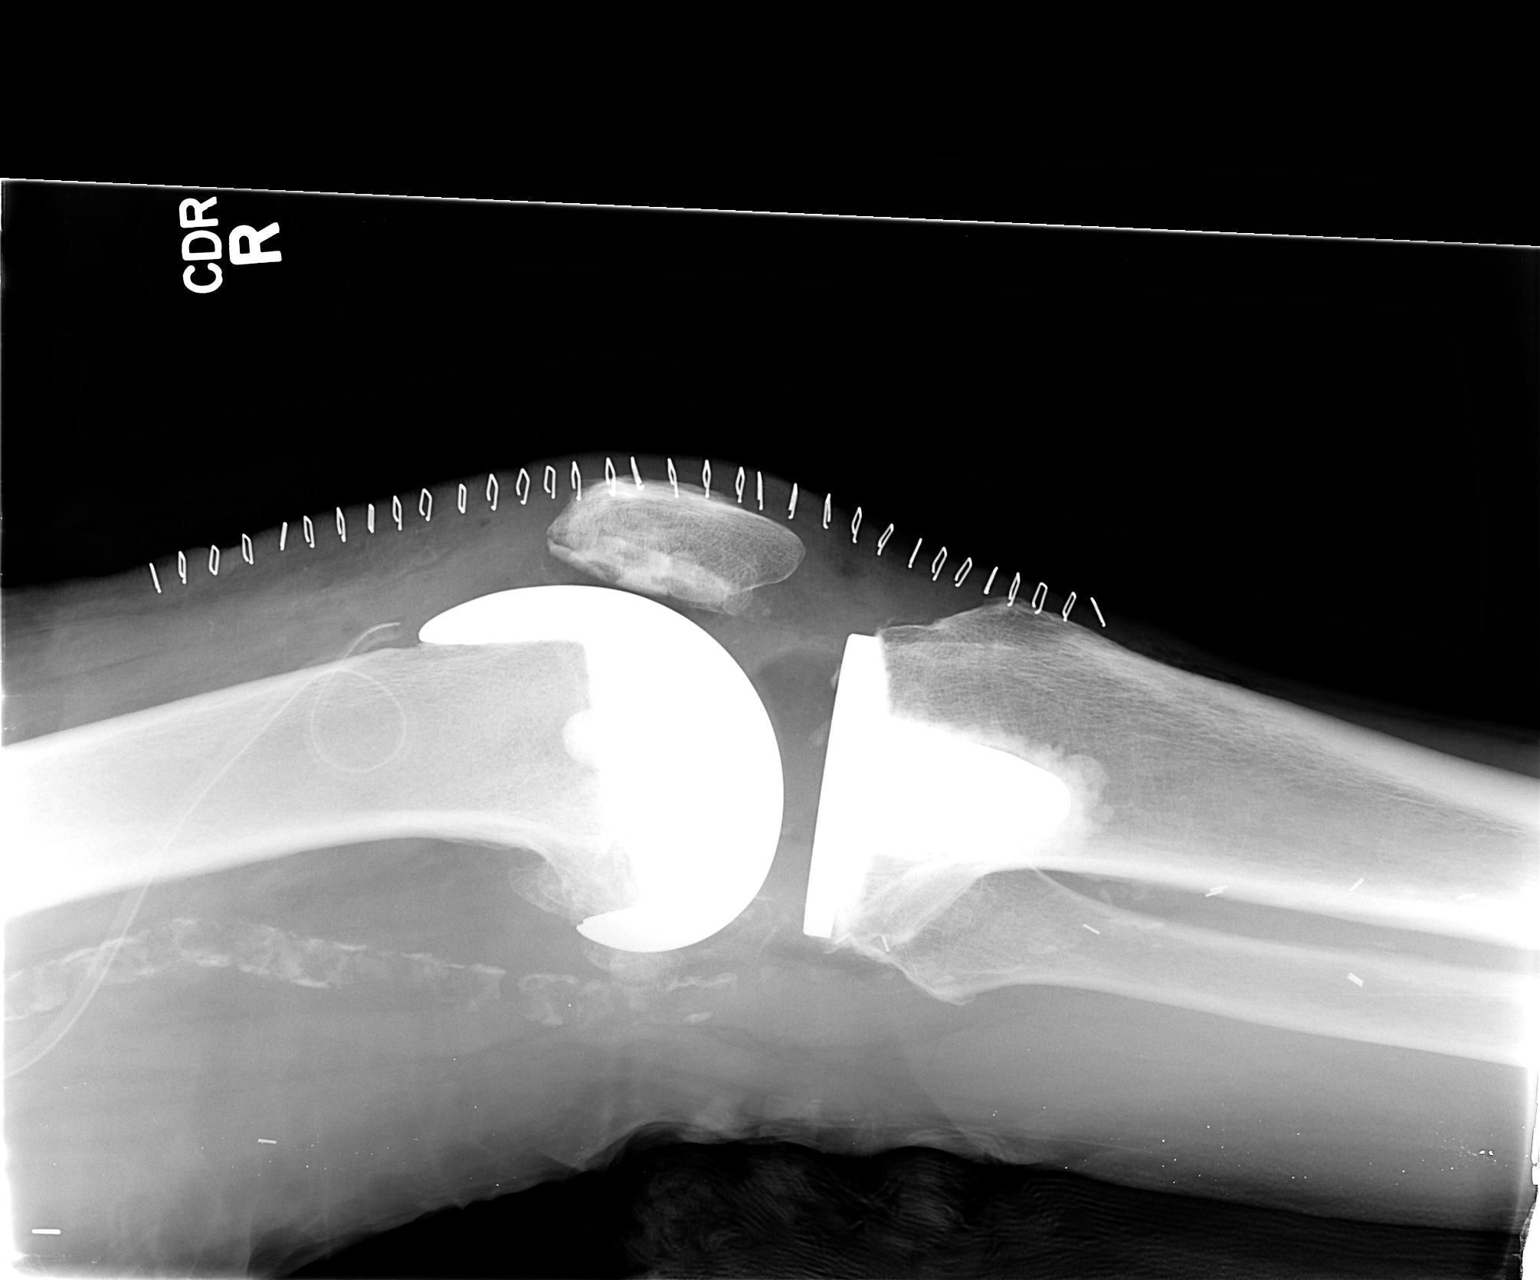

[2 of 2 positions shown; findings below may reference images not displayed]

FINDINGS: The patient is status post right knee replacement.  Soft
tissue drain in place.  No hardware or bony complicating feature.
Dense vascular calcifications posterior to the distal femur and
knee.
IMPRESSION: Right and placement.  No complicating feature.

## 2013-12-10 ENCOUNTER — Ambulatory Visit: Payer: Medicare Other | Admitting: Interventional Cardiology

## 2014-01-02 ENCOUNTER — Ambulatory Visit (INDEPENDENT_AMBULATORY_CARE_PROVIDER_SITE_OTHER): Payer: Medicare Other | Admitting: Interventional Cardiology

## 2014-01-02 ENCOUNTER — Encounter: Payer: Self-pay | Admitting: Interventional Cardiology

## 2014-01-02 ENCOUNTER — Other Ambulatory Visit: Payer: Self-pay | Admitting: Interventional Cardiology

## 2014-01-02 VITALS — BP 155/67 | HR 71 | Ht 67.25 in | Wt 184.4 lb

## 2014-01-02 DIAGNOSIS — I10 Essential (primary) hypertension: Secondary | ICD-10-CM | POA: Insufficient documentation

## 2014-01-02 DIAGNOSIS — I2510 Atherosclerotic heart disease of native coronary artery without angina pectoris: Secondary | ICD-10-CM

## 2014-01-02 DIAGNOSIS — I5032 Chronic diastolic (congestive) heart failure: Secondary | ICD-10-CM | POA: Insufficient documentation

## 2014-01-02 DIAGNOSIS — I25810 Atherosclerosis of coronary artery bypass graft(s) without angina pectoris: Secondary | ICD-10-CM | POA: Insufficient documentation

## 2014-01-02 DIAGNOSIS — E785 Hyperlipidemia, unspecified: Secondary | ICD-10-CM | POA: Insufficient documentation

## 2014-01-02 DIAGNOSIS — E119 Type 2 diabetes mellitus without complications: Secondary | ICD-10-CM | POA: Insufficient documentation

## 2014-01-02 MED ORDER — HYDROCHLOROTHIAZIDE 12.5 MG PO CAPS: ORAL_CAPSULE | ORAL | Status: DC

## 2014-01-02 NOTE — Patient Instructions (Addendum)
Your physician has recommended you make the following change in your medication:  1) START HCTZ 12.5mg  three times a week. Mon, Wed, Fri. An Rx has been sent to your pharmacy  Take all other medication as prescribed   Your physician wants you to follow-up in: 1 year You will receive a reminder letter in the mail two months in advance. If you don't receive a letter, please call our office to schedule the follow-up appointment.

## 2014-01-02 NOTE — Progress Notes (Signed)
Patient ID: Gregory Barry, male   DOB: 11/19/1920, 80 y.o.   MRN: [id number]    [address] [place], [place]  [id number] Phone: [phone]) [phone] Fax:  [phone]  Date:  01/02/2014   ID:  Gregory Barry, DOB 02/16/1921, MRN [id number]  PCP:  Lillia MountainGRIFFIN,Nabeel JOSEPH, MD   ASSESSMENT:  1. Coronary artery disease, stable with exertional dyspnea perhaps as an anginal equivalent.  2. Hyperlipidemia 3. Hypertension, borderline control 4. Exertional dyspnea, suspected to be related to diastolic heart failure 4. Diabetes mellitus   PLAN:  1. Change in current medical regimen to add low-dose diuretic therapy. Will decrease lisinopril from 40 mg daily to lisinopril/HCTZ 20/12.5 mg daily 2. return in 4-6 weeks for followup of dyspnea and blood pressure 3. All return he should have a basic metabolic panel 4. Call if clinical symptoms   SUBJECTIVE: Gregory Barry is a 80 y.o. male who is doing relatively well. He denies angina. He is getting older and feels that his exertional tolerance is waning. His major complaint is dyspnea on exertion. He has no orthopnea. There is trace lower extremity edema. He has had no syncope or prolonged palpitations.   Wt Readings from Last 3 Encounters:  01/02/14 184 lb 6.4 oz (83.643 kg)     Past Medical History  Diagnosis Date  . CAD (coronary artery disease)   . Essential hypertension, benign   . Mixed hyperlipidemia   . Low back pain   . Osteoarthrosis, unspecified whether generalized or localized, lower leg   . Insomnia, unspecified   . Discoid lupus   . Allergic rhinitis   . BPH (benign prostatic hyperplasia)   . Constipated   . Calculus of kidney   . Hearing loss   . Amnesia, global, transient     Episode was very brief & not associated with other neurlogical findings.  . Type II or unspecified type diabetes mellitus without mention of complication, not stated as uncontrolled   . Overweight   . Dizziness     With standing but could not  document orthostasis today  . Type II or unspecified type diabetes mellitus with neurological manifestations, not stated as uncontrolled   . Polyneuropathy in diabetes(357.2)   . Other and unspecified hyperlipidemia   . Coronary atherosclerosis     With sequential SVG to D#1 and #2, sequential SVG to OM #1 and #2, SVG to PDA, and LIMA to LAD. Patent grafts 2205 and no ischemia by Nuc 09/2009 [name](Payslie Mccaig)  . Triggering of finger     Left thumb and right fourth finger  . Periarthritis 2001    Left shoulder  . Fungal infection of toenail   . Cataract   . Meralgia paresthetica 1997  . Stroke 01/2011    R BG CVA on MRI, MRA occluded R vertebral artery  . Balance problem     Current Outpatient Prescriptions  Medication Sig Dispense Refill  . aspirin EC 81 MG tablet Take 162 mg by mouth daily.      . cholecalciferol (VITAMIN D) 1000 UNITS tablet Take 1,000 Units by mouth daily.      . Coenzyme Q10 (CO Q 10) 10 MG CAPS Take 10 mg by mouth daily.      . hydroxypropyl methylcellulose (ISOPTO TEARS) 2.5 % ophthalmic solution 1 drop 3 (three) times daily.      . isosorbide mononitrate (IMDUR) 60 MG 24 hr tablet Take 60 mg by mouth daily.      .Marland Kitchen  Melatonin 5 MG TABS Take 1 tablet by mouth as needed.      . metFORMIN (GLUCOPHAGE) 500 MG tablet Take 500 mg by mouth 2 (two) times daily with a meal.      . metoprolol succinate (TOPROL-XL) 50 MG 24 hr tablet Take 50 mg by mouth daily. Take with or immediately following a meal.      . nitroGLYCERIN (NITROSTAT) 0.4 MG SL tablet Place 0.4 mg under the tongue every 5 (five) minutes as needed for chest pain.      . Omega-3 Fatty Acids (FISH OIL) 1200 MG CAPS Take 1 capsule by mouth 2 (two) times daily.      . pioglitazone (ACTOS) 15 MG tablet Take 15 mg by mouth 2 (two) times daily.      . polyethylene glycol (MIRALAX / GLYCOLAX) packet Take 17 g by mouth at bedtime as needed.      . pravastatin (PRAVACHOL) 20 MG tablet Take 20 mg by mouth daily.      . ramipril  (ALTACE) 2.5 MG capsule Take 2.5 mg by mouth daily.      . tamsulosin (FLOMAX) 0.4 MG CAPS capsule Take 1 capsule 30 minutes after the same meal each day       No current facility-administered medications for this visit.    Allergies:    Allergies  Allergen Reactions  . Simvastatin Other (See Comments)    Leg cramps  . Sporanos [Itraconazole] Rash    Social History:  The patient  reports that he has quit smoking. He does not have any smokeless tobacco history on file. He reports that he drinks alcohol. He reports that he does not use illicit drugs.   ROS:  Please see the history of present illness.   Appetite is stable.   All other systems reviewed and negative.   OBJECTIVE: VS:  BP 155/67  Pulse 71  Wt 184 lb 6.4 oz (83.643 kg) Well nourished, well developed, in no acute distress, appears stated age HEENT: normal Neck: JVD flat. Carotid bruit absent  Cardiac:  normal S1, S2; RRR; no murmur Lungs:  clear to auscultation bilaterally, no wheezing, rhonchi or rales Abd: soft, nontender, no hepatomegaly Ext: Edema bilateral trace edema. Pulses 2+ Skin: warm and dry Neuro:  CNs 2-12 intact, no focal abnormalities noted  EKG:  Normal sinus rhythm. Inferoposterior myocardial infarction.       Signed, Darci Needle III, MD 01/02/2014 2:56 PM  Past Medical History  Coronary atherosclerotic heart disease with sequential SVG to D#1 and #2, sequential SVG to OM #1 and #2, SVG to PDA, and LIMA to LAD. Patent grafts 2005 and no ischemia by Nuc 09/2009, Jaclyne Haverstick   Type 2 diabetes mellitus, Sharl Ma   Hypertension   Hyperlipidemia   Osteoarthritis right knee and left knee (total knee replacement 2007)   BPH, [name]   thrombocytopenia 2007 Dr. Dalene Carrow rule out early myelodysplastic syndrome versus ITP   Hemorrhoid   Triggering left thumb 2001   Status post leg cramps   Periarthritis left shoulder 2001   Fungal infection toenails   Cataract   Constipation   Meralgia  paresthetica 1997   Status post rash buttocks and glans penis cream dermatology is better   Status post triggering right fourth finger   Allergic rhinitis   status post prostatitis versus UTI 1993E coli   infected sebaceous cyst despite the Keflex 1995   Discoid lupus biopsy lesion head with residual scar   Decreased hearing  Status post carpal tunnel syndrome   decreased sleep (Tylenol PM)   Trifocal glasses   nephrolithiasis (treated with lithotripsy), [name]   R BG CVA on mri 4/12, MRA occluded R vertebral artery   balance problems, ENT at [facility] diagnosed 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

## 2014-01-31 ENCOUNTER — Encounter: Payer: Self-pay | Admitting: Interventional Cardiology

## 2014-02-10 ENCOUNTER — Other Ambulatory Visit: Payer: Self-pay | Admitting: Interventional Cardiology

## 2014-02-27 ENCOUNTER — Other Ambulatory Visit: Payer: Self-pay | Admitting: Family Medicine

## 2014-02-27 ENCOUNTER — Ambulatory Visit
Admission: RE | Admit: 2014-02-27 | Discharge: 2014-02-27 | Disposition: A | Discharge status: Home / Self Care | Payer: Medicare Other | Source: Ambulatory Visit | Attending: Family Medicine | Admitting: Family Medicine

## 2014-02-27 DIAGNOSIS — M549 Dorsalgia, unspecified: Secondary | ICD-10-CM

## 2014-09-09 ENCOUNTER — Other Ambulatory Visit: Payer: Self-pay | Admitting: Interventional Cardiology

## 2015-01-23 ENCOUNTER — Ambulatory Visit (INDEPENDENT_AMBULATORY_CARE_PROVIDER_SITE_OTHER): Payer: Medicare Other | Admitting: Interventional Cardiology

## 2015-01-23 ENCOUNTER — Encounter: Payer: Self-pay | Admitting: Interventional Cardiology

## 2015-01-23 VITALS — BP 160/76 | HR 71 | Ht 67.0 in | Wt 178.8 lb

## 2015-01-23 DIAGNOSIS — I5032 Chronic diastolic (congestive) heart failure: Secondary | ICD-10-CM | POA: Diagnosis not present

## 2015-01-23 DIAGNOSIS — I25709 Atherosclerosis of coronary artery bypass graft(s), unspecified, with unspecified angina pectoris: Secondary | ICD-10-CM | POA: Diagnosis not present

## 2015-01-23 DIAGNOSIS — I10 Essential (primary) hypertension: Secondary | ICD-10-CM | POA: Diagnosis not present

## 2015-01-23 DIAGNOSIS — I25810 Atherosclerosis of coronary artery bypass graft(s) without angina pectoris: Secondary | ICD-10-CM

## 2015-01-23 DIAGNOSIS — E785 Hyperlipidemia, unspecified: Secondary | ICD-10-CM | POA: Diagnosis not present

## 2015-01-23 NOTE — Progress Notes (Signed)
Cardiology Office Note   Date:  01/23/2015   ID:  Gregory Barry, DOB April 07, 1921, MRN [id number]  PCP:  Gregory Mountain, MD  Cardiologist:   Gregory Noe, MD   No chief complaint on file.     History of Present Illness: Gregory Barry is a 81 y.o. male who presents for coronary artery disease. He has had a prior inferolateral infarct. He has not had angina now in several years. He is status post coronary artery bypass grafting.  His major current limitations related to lumbar stenosis that causes leg pain and difficulty with ambulation. He is contemplating surgery if a surgical option exists. He Brenner Gregory Barry.  His antihypertensive regimen has been decreased with ram approval and hydrochlorothiazide being discontinued because of chronically low blood pressures. Gregory Barry is in charge of this. The patient is brought in along with his blood pressure recordings.    Past Medical History  Diagnosis Date  . CAD (coronary artery disease)   . Essential hypertension, benign   . Mixed hyperlipidemia   . Low back pain   . Osteoarthrosis, unspecified whether generalized or localized, lower leg   . Insomnia, unspecified   . Discoid lupus   . Allergic rhinitis   . BPH (benign prostatic hyperplasia)   . Constipated   . Calculus of kidney   . Hearing loss   . Amnesia, global, transient     Episode was very brief & not associated with other neurlogical findings.  . Type II or unspecified type diabetes mellitus without mention of complication, not stated as uncontrolled   . Overweight(278.02)   . Dizziness     With standing but could not document orthostasis today  . Type II or unspecified type diabetes mellitus with neurological manifestations, not stated as uncontrolled   . Polyneuropathy in diabetes(357.2)   . Other and unspecified hyperlipidemia   . Coronary atherosclerosis     With sequential SVG to D#1 and #2, sequential SVG to OM #1 and #2, SVG to PDA, and LIMA  to LAD. Patent grafts 2205 and no ischemia by Nuc 09/2009 Gregory Barry)  . Triggering of finger     Left thumb and right fourth finger  . Periarthritis 2001    Left shoulder  . Fungal infection of toenail   . Cataract   . Meralgia paresthetica 1997  . Stroke 01/2011    R BG CVA on MRI, MRA occluded R vertebral artery  . Balance problem     Past Surgical History  Procedure Laterality Date  . Coronary artery bypass graft  1997  . Back surgery      Lentigo back  . Spine surgery  2006    spinal stenosis surgery  . Lesion removal      Benign lesions  . Tonsillectomy    . Inguinal hernia repair Right 1997  . Total knee arthroplasty Left about 2005    Gregory Barry  . Total knee arthroplasty Right 07/29/11  . Coronary angioplasty with stent placement      With sequential SVG to D#1 and #2, sequential SVG to OM #1 and #2, SVG to PDA, and LIMA to LAD. Patent grafts 2205 and no ischemia by Nuc 09/2009 Gregory Barry)  . Lithotripsy      Gregory Barry     Current Outpatient Prescriptions  Medication Sig Dispense Refill  . aspirin EC 81 MG tablet Take 162 mg by mouth daily.    . cholecalciferol (VITAMIN D) 1000 UNITS tablet Take 1,000  Units by mouth daily.    . Coenzyme Q10 (CO Q 10) 10 MG CAPS Take 10 mg by mouth daily.    . hydroxypropyl methylcellulose (ISOPTO TEARS) 2.5 % ophthalmic solution 1 drop 3 (three) times daily.    . isosorbide mononitrate (IMDUR) 60 MG 24 hr tablet TAKE 1 TABLET BY MOUTH ONCE DAILY 90 tablet 2  . Melatonin 5 MG TABS Take 1 tablet by mouth as needed.    . metFORMIN (GLUCOPHAGE) 500 MG tablet Take 500 mg by mouth 2 (two) times daily with a meal.    . metoprolol succinate (TOPROL-XL) 50 MG 24 hr tablet Take 50 mg by mouth daily. Take with or immediately following a meal.    . nitroGLYCERIN (NITROSTAT) 0.4 MG SL tablet Place 0.4 mg under the tongue every 5 (five) minutes as needed for chest pain.    . Omega-3 Fatty Acids (FISH OIL) 1200 MG CAPS Take 1 capsule by mouth 2 (two)  times daily.    . pioglitazone (ACTOS) 15 MG tablet Take 15 mg by mouth 2 (two) times daily.    . polyethylene glycol (MIRALAX / GLYCOLAX) packet Take 17 g by mouth at bedtime as needed.    . pravastatin (PRAVACHOL) 20 MG tablet Take 20 mg by mouth daily.    . tamsulosin (FLOMAX) 0.4 MG CAPS capsule Take 1 capsule 30 minutes after the same meal each day    . VESICARE 10 MG tablet Take 10 mg by mouth daily.  5   No current facility-administered medications for this visit.    Allergies:   Simvastatin and Sporanos    Social History:  The patient  reports that he has quit smoking. He does not have any smokeless tobacco history on file. He reports that he drinks alcohol. He reports that he does not use illicit drugs.   Family History:  The patient's family history includes Cancer in his maternal grandmother; Congestive Heart Failure in his father and mother; Down syndrome in his brother; Heart failure in his father and other; Hypertension in his mother; Pneumonia in his brother; Stroke in his other; Thrombosis in his mother.    ROS:  Please see the history of present illness.   Otherwise, review of systems are positive for significant back and leg discomfort with activity. Difficulty ambulating. Water aerobics is all that he can do at this point. He had been a regular walker Y. .   All other systems are reviewed and negative.    PHYSICAL EXAM: VS:  BP 160/76 mmHg  Pulse 71  Ht 5\' 7"  (1.702 m)  Wt 178 lb 12.8 oz (81.103 kg)  BMI 28.00 kg/m2 , BMI Body mass index is 28 kg/(m^2). GEN: Well nourished, well developed, in no acute distress HEENT: normal Neck: no JVD, carotid bruits, or masses Cardiac: RRR;. He has a grade 2 to 3/6 systolic murmur at the right upper sternal border and at the left lower sternal border.  No rubs, or gallops,no edema  Respiratory:  clear to auscultation bilaterally, normal work of breathing GI: soft, nontender, nondistended, + BS MS: no deformity or atrophy Skin:  warm and dry, no rash Neuro:  Strength and sensation are intact Psych: euthymic mood, full affect   EKG:  EKG is ordered today. The ekg ordered today demonstrates normal sinus rhythm with small inferior Q waves, tall R in V1, and leftward axis. Otherwise normal. No change when compared to one year ago.   Recent Labs: No results found for requested labs  within last 365 days.    Lipid Panel No results found for: CHOL, TRIG, HDL, CHOLHDL, VLDL, LDLCALC, LDLDIRECT    Wt Readings from Last 3 Encounters:  01/23/15 178 lb 12.8 oz (81.103 kg)  01/02/14 184 lb 6.4 oz (83.643 kg)      Other studies Reviewed: Additional studies/ records that were reviewed today includblood pressure records provided by the patient. Review of the above records demonsblood pressures of been well controlled at home.  ASSESSMENT AND PLAN:  Coronary artery disease involving coronary bypass graft of native heart: No recent angina  Essential hypertension - low blood pressures have now resulted in higher blood pressure readings. Today's blood pressures are not acceptable but may be related to environment. He will continue to monitor his pressures at home. He will calibrate his cuff against an office machine at the next possible opportunity.  Preoperative Cardiovascular exam: She had the patient undergo surgery for lumbar stenosis, he will be an intermediate risk for cardiovascular complications. Before clearing him he would need to have an echocardiogram to exclude the possibility that the systolic murmur represents significant aortic stenosis.  Chronic diastolic heart failure - no evidence of volume overload  Systolic murmur of aortic stenosis that is clinically not felt to be significant.    Current medicines are reviewed at length with the patient today.  The patient does not have concerns regarding medicines.  The following changes have been made:  no change: I discussed with the patient and his wife about  cardiac clearance for spinal surgery. We discussed the levels of risk involved with both the operation and the type of surgery being performed. They will see Gregory Barry later this year and if surgery is indicated, they will let me know. At that time an echocardiogram will be done for final clearance.  Labs/ tests ordered today include:  No orders of the defined types were placed in this encounter.     Disposition:   FU with H.Nilan Iddings with in 1 year   Signed, Gregory Noe, MD  01/23/2015 11:30 AM    [facility] [address] [place], [place], [place]  [address] Phone: [phone]; Fax: [phone]

## 2015-01-23 NOTE — Patient Instructions (Signed)
Your physician recommends that you continue on your current medications as directed. Please refer to the Current Medication list given to you today.  Your physician wants you to follow-up in: 1 year with Dr.Smith You will receive a reminder letter in the mail two months in advance. If you don't receive a letter, please call our office to schedule the follow-up appointment.  

## 2015-01-27 ENCOUNTER — Other Ambulatory Visit: Payer: Self-pay | Admitting: Neurological Surgery

## 2015-02-06 ENCOUNTER — Encounter (HOSPITAL_COMMUNITY): Payer: Self-pay

## 2015-02-06 ENCOUNTER — Encounter (HOSPITAL_COMMUNITY)
Admission: RE | Admit: 2015-02-06 | Discharge: 2015-02-06 | Disposition: A | Discharge status: Home / Self Care | Payer: Medicare Other | Source: Ambulatory Visit | Attending: Neurological Surgery | Admitting: Neurological Surgery

## 2015-02-06 ENCOUNTER — Other Ambulatory Visit: Payer: Self-pay

## 2015-02-06 DIAGNOSIS — R011 Cardiac murmur, unspecified: Secondary | ICD-10-CM

## 2015-02-06 DIAGNOSIS — M48061 Spinal stenosis, lumbar region without neurogenic claudication: Secondary | ICD-10-CM

## 2015-02-06 LAB — CBC WITH DIFFERENTIAL/PLATELET
Basophils Absolute: 0 10*3/uL (ref 0.0–0.1)
Basophils Relative: 0 % (ref 0–1)
EOS ABS: 0.4 10*3/uL (ref 0.0–0.7)
EOS PCT: 6 % — AB (ref 0–5)
HEMATOCRIT: 36.5 % — AB (ref 39.0–52.0)
Hemoglobin: 12 g/dL — ABNORMAL LOW (ref 13.0–17.0)
LYMPHS ABS: 1.1 10*3/uL (ref 0.7–4.0)
Lymphocytes Relative: 16 % (ref 12–46)
MCH: 33.2 pg (ref 26.0–34.0)
MCHC: 32.9 g/dL (ref 30.0–36.0)
MCV: 101.1 fL — AB (ref 78.0–100.0)
MONO ABS: 0.5 10*3/uL (ref 0.1–1.0)
MONOS PCT: 7 % (ref 3–12)
Neutro Abs: 4.8 10*3/uL (ref 1.7–7.7)
Neutrophils Relative %: 71 % (ref 43–77)
PLATELETS: 169 10*3/uL (ref 150–400)
RBC: 3.61 MIL/uL — AB (ref 4.22–5.81)
RDW: 12.9 % (ref 11.5–15.5)
WBC: 6.7 10*3/uL (ref 4.0–10.5)

## 2015-02-06 LAB — BASIC METABOLIC PANEL
Anion gap: 9 (ref 5–15)
BUN: 21 mg/dL (ref 6–23)
CO2: 28 mmol/L (ref 19–32)
Calcium: 9.3 mg/dL (ref 8.4–10.5)
Chloride: 103 mmol/L (ref 96–112)
Creatinine, Ser: 0.96 mg/dL (ref 0.50–1.35)
GFR, EST AFRICAN AMERICAN: 81 mL/min — AB (ref >90)
GFR, EST NON AFRICAN AMERICAN: 70 mL/min — AB (ref >90)
Glucose, Bld: 145 mg/dL — ABNORMAL HIGH (ref 70–99)
Potassium: 4.4 mmol/L (ref 3.5–5.1)
SODIUM: 140 mmol/L (ref 135–145)

## 2015-02-06 LAB — PROTIME-INR
INR: 1 (ref 0.00–1.49)
PROTHROMBIN TIME: 13.3 s (ref 11.6–15.2)

## 2015-02-06 LAB — SURGICAL PCR SCREEN
MRSA, PCR: NEGATIVE
Staphylococcus aureus: POSITIVE — AB

## 2015-02-06 NOTE — Pre-Procedure Instructions (Signed)
Gregory LeepJohn H Barry  02/06/2015   Your procedure is scheduled on:  02/12/2015  [facility] Ad[facility]   ENTRANCE   A   at  10:15 AM.  Call this number if you have problems the morning of surgery: [phone]   Remember:   Do not eat food or drink liquids after midnight. On Wednesday   Take these medicines the morning of surgery with A SIP OF WATER: none   Do not wear jewelry   Do not wear lotions, powders, or perfumes. You may wear deodorant.    Men may shave face and neck.   Do not bring valuables to the hospital.  [facility] is not responsible                  for any belongings or valuables.               Contacts, dentures or bridgework may not be worn into surgery.   Leave suitcase in the car. After surgery it may be brought to your room.   For patients admitted to the hospital, discharge time is determined by your                treatment team.               Patients discharged the day of surgery will not be allowed to drive  home.  Name and phone number of your driver: /with wife  Special Instructions: Special Instructions: [facility] - Preparing for Surgery  Before surgery, you can play an important role.  Because skin is not sterile, your skin needs to be as free of germs as possible.  You can reduce the number of germs on you skin by washing with CHG (chlorahexidine gluconate) soap before surgery.  CHG is an antiseptic cleaner which kills germs and bonds with the skin to continue killing germs even after washing.  Please DO NOT use if you have an allergy to CHG or antibacterial soaps.  If your skin becomes reddened/irritated stop using the CHG and inform your nurse when you arrive at Short Stay.  Do not shave (including legs and underarms) for at least 48 hours prior to the first CHG shower.  You may shave your face.  Please follow these instructions carefully:   1.  Shower with CHG Soap the night before surgery and the  morning of Surgery.  2.  If you  choose to wash your hair, wash your hair first as usual with your  normal shampoo.  3.  After you shampoo, rinse your hair and body thoroughly to remove the  Shampoo.  4.  Use CHG as you would any other liquid soap.  You can apply chg directly to the skin and wash gently with scrungie or a clean washcloth.  5.  Apply the CHG Soap to your body ONLY FROM THE NECK DOWN.    Do not use on open wounds or open sores.  Avoid contact with your eyes, ears, mouth and genitals (private parts).  Wash genitals (private parts)   with your normal soap.  6.  Wash thoroughly, paying special attention to the area where your surgery will be performed.  7.  Thoroughly rinse your body with warm water from the neck down.  8.  DO NOT shower/wash with your normal soap after using and rinsing off   the CHG Soap.  9.  Pat yourself dry with a clean towel.  10.  Wear clean pajamas.            11.  Place clean sheets on your bed the night of your first shower and do not sleep with pets.  Day of Surgery  Do not apply any lotions/deodorants the morning of surgery.  Please wear clean clothes to the hospital/surgery center.   Please read over the following fact sheets that you were given: Pain Booklet, Coughing and Deep Breathing, MRSA Information and Surgical Site Infection Prevention

## 2015-02-06 NOTE — Progress Notes (Signed)
I [place] a [place]iro[place] ointment [place] [facility] [place] [place].

## 2015-02-06 NOTE — Progress Notes (Signed)
Appt. Arranged by Dr. Michaelle CopasSmith's office for cardiac testing ;  & pt. & wife are aware, Monday- 02/09/2015 at 1600

## 2015-02-06 NOTE — Progress Notes (Signed)
Call to pharm. Tech. For med. Reconciliation  

## 2015-02-06 NOTE — Progress Notes (Signed)
Pt. Chart reflects that he needs cardiac testing before Dr. Katrinka BlazingSmith will clear him for surgery.  Pt. & spouse agree.  [facility]  Sp[facility] with Cala [facility]Kimberly at [facility], she will call & schedule the necessary test & will contact ht ept. & family to let them know of the time & date.

## 2015-02-09 ENCOUNTER — Ambulatory Visit (HOSPITAL_COMMUNITY): Payer: Medicare Other | Attending: Cardiology | Admitting: Radiology

## 2015-02-09 DIAGNOSIS — R011 Cardiac murmur, unspecified: Secondary | ICD-10-CM | POA: Diagnosis not present

## 2015-02-09 NOTE — Progress Notes (Addendum)
Anesthesia Chart Review:  Pt is 81 year old male scheduled for L1-2, L2-3 laminectomy on 02/12/2015 with Dr. Yetta BarreJones.   Cardiologist is Dr. Katrinka BlazingSmith.   PMH includes: CAD (s/p CABG with sequential SVG to D#1 and #2, sequential SVG to OM #1 and #2, SVG to PDA, and LIMA to LAD), stroke (2012), moderate aortic stenosis, DM, HTN, lupus, hyperlipidemia. Former smoker. BMI 28.   Preoperative labs reviewed.    Chest x-ray reviewed. No active cardiopulmonary disease.   EKG 01/23/2015:  NSR. LAD. Inferior infarct, age undetermined. Unchanged when compared to tracing from 2015 per Dr. Michaelle CopasSmith's interpretation.   By notes, last stress test was 2010 and did not demonstrate any ischemia.   Echo 02/09/2015: - Left ventricle: The cavity size was normal. Systolic function was normal. The estimated ejection fraction was in the range of 55% to 60%. Wall motion was normal; there were no regional wall motion abnormalities. There was an increased relative contribution of atrial contraction to ventricular filling. Doppler parameters are consistent with abnormal left ventricular relaxation (grade 1 diastolic dysfunction). - Aortic valve: Moderate thickening and calcification, consistent with sclerosis. Valve mobility was moderately restricted. There was moderate stenosis. Mean gradient (S): 23 mm Hg. Peak gradient (S): 43 mm Hg. Valve area (VTI): 1.23 cm^2. Valve area (Vmax): 1.1 cm^2. Valve area (Vmean): 1.2 cm^2. - Mitral valve: Moderate mitral annular calcification of the posterior mitral valve annulus. Valve area by pressure half-time: 2.27 cm^2. Valve area by continuity equation (using LVOT flow): 2.31 cm^2. - Pulmonic valve: There was trivial regurgitation.  Pt has cardiac clearance from Dr. Katrinka BlazingSmith for upcoming surgery.   If no changes, I anticipate pt can proceed with surgery as scheduled.   Rica Mastngela Torryn Fiske, FNP-BC [facility]iology Phone: [phone] 02/10/2015 1:13 PM

## 2015-02-09 NOTE — Progress Notes (Signed)
Echocardiogram performed.  

## 2015-02-10 ENCOUNTER — Other Ambulatory Visit (HOSPITAL_COMMUNITY): Payer: Medicare Other

## 2015-02-10 ENCOUNTER — Encounter: Payer: Self-pay | Admitting: Interventional Cardiology

## 2015-02-10 ENCOUNTER — Telehealth: Payer: Self-pay

## 2015-02-10 NOTE — Telephone Encounter (Signed)
-----   Message from Lyn RecordsHenry W Smith, MD sent at 02/10/2015  7:25 AM EDT ----- Normal vale function with moderate stenosis which should not prohibit upcoming surgery. Therefore, cleared for upcoming surgery.

## 2015-02-11 MED ORDER — CEFAZOLIN SODIUM-DEXTROSE 2-3 GM-% IV SOLR
2.0000 g | INTRAVENOUS | Status: AC
  Administered 2015-02-12: 2 g via INTRAVENOUS
  Filled 2015-02-11 (×2): qty 50

## 2015-02-11 MED ORDER — DEXAMETHASONE SODIUM PHOSPHATE 10 MG/ML IJ SOLN
10.0000 mg | INTRAMUSCULAR | Status: AC
  Administered 2015-02-12: 10 mg via INTRAVENOUS
  Filled 2015-02-11: qty 1

## 2015-02-12 ENCOUNTER — Inpatient Hospital Stay (HOSPITAL_COMMUNITY)
Admission: RE | Admit: 2015-02-12 | Discharge: 2015-02-13 | DRG: 520 | Disposition: A | Discharge status: Home / Self Care | Payer: Medicare Other | Source: Ambulatory Visit | Attending: Neurological Surgery | Admitting: Neurological Surgery

## 2015-02-12 ENCOUNTER — Inpatient Hospital Stay (HOSPITAL_COMMUNITY): Payer: Medicare Other

## 2015-02-12 ENCOUNTER — Inpatient Hospital Stay (HOSPITAL_COMMUNITY): Payer: Medicare Other | Admitting: Anesthesiology

## 2015-02-12 ENCOUNTER — Encounter (HOSPITAL_COMMUNITY)
Admission: RE | Disposition: A | Discharge status: Home / Self Care | Payer: Self-pay | Source: Ambulatory Visit | Attending: Neurological Surgery

## 2015-02-12 ENCOUNTER — Inpatient Hospital Stay (HOSPITAL_COMMUNITY): Payer: Medicare Other | Admitting: Emergency Medicine

## 2015-02-12 ENCOUNTER — Encounter (HOSPITAL_COMMUNITY): Payer: Self-pay | Admitting: Neurological Surgery

## 2015-02-12 DIAGNOSIS — G4700 Insomnia, unspecified: Secondary | ICD-10-CM | POA: Diagnosis present

## 2015-02-12 DIAGNOSIS — Z823 Family history of stroke: Secondary | ICD-10-CM

## 2015-02-12 DIAGNOSIS — Z87442 Personal history of urinary calculi: Secondary | ICD-10-CM | POA: Diagnosis not present

## 2015-02-12 DIAGNOSIS — Z96653 Presence of artificial knee joint, bilateral: Secondary | ICD-10-CM | POA: Diagnosis present

## 2015-02-12 DIAGNOSIS — Z7982 Long term (current) use of aspirin: Secondary | ICD-10-CM | POA: Diagnosis not present

## 2015-02-12 DIAGNOSIS — E1142 Type 2 diabetes mellitus with diabetic polyneuropathy: Secondary | ICD-10-CM | POA: Diagnosis present

## 2015-02-12 DIAGNOSIS — I2510 Atherosclerotic heart disease of native coronary artery without angina pectoris: Secondary | ICD-10-CM | POA: Diagnosis present

## 2015-02-12 DIAGNOSIS — Z87891 Personal history of nicotine dependence: Secondary | ICD-10-CM

## 2015-02-12 DIAGNOSIS — Z955 Presence of coronary angioplasty implant and graft: Secondary | ICD-10-CM | POA: Diagnosis not present

## 2015-02-12 DIAGNOSIS — Z419 Encounter for procedure for purposes other than remedying health state, unspecified: Secondary | ICD-10-CM

## 2015-02-12 DIAGNOSIS — Z951 Presence of aortocoronary bypass graft: Secondary | ICD-10-CM

## 2015-02-12 DIAGNOSIS — Z888 Allergy status to other drugs, medicaments and biological substances status: Secondary | ICD-10-CM | POA: Diagnosis not present

## 2015-02-12 DIAGNOSIS — Z9889 Other specified postprocedural states: Secondary | ICD-10-CM

## 2015-02-12 DIAGNOSIS — M4806 Spinal stenosis, lumbar region: Secondary | ICD-10-CM | POA: Diagnosis present

## 2015-02-12 DIAGNOSIS — Z8673 Personal history of transient ischemic attack (TIA), and cerebral infarction without residual deficits: Secondary | ICD-10-CM

## 2015-02-12 DIAGNOSIS — Z8249 Family history of ischemic heart disease and other diseases of the circulatory system: Secondary | ICD-10-CM

## 2015-02-12 DIAGNOSIS — M170 Bilateral primary osteoarthritis of knee: Secondary | ICD-10-CM | POA: Diagnosis present

## 2015-02-12 DIAGNOSIS — Z79899 Other long term (current) drug therapy: Secondary | ICD-10-CM

## 2015-02-12 DIAGNOSIS — I350 Nonrheumatic aortic (valve) stenosis: Secondary | ICD-10-CM | POA: Diagnosis present

## 2015-02-12 DIAGNOSIS — Z6827 Body mass index (BMI) 27.0-27.9, adult: Secondary | ICD-10-CM

## 2015-02-12 DIAGNOSIS — E663 Overweight: Secondary | ICD-10-CM | POA: Diagnosis present

## 2015-02-12 DIAGNOSIS — L930 Discoid lupus erythematosus: Secondary | ICD-10-CM | POA: Diagnosis present

## 2015-02-12 DIAGNOSIS — E1149 Type 2 diabetes mellitus with other diabetic neurological complication: Secondary | ICD-10-CM | POA: Diagnosis present

## 2015-02-12 DIAGNOSIS — E782 Mixed hyperlipidemia: Secondary | ICD-10-CM | POA: Diagnosis present

## 2015-02-12 DIAGNOSIS — H9190 Unspecified hearing loss, unspecified ear: Secondary | ICD-10-CM | POA: Diagnosis present

## 2015-02-12 DIAGNOSIS — N400 Enlarged prostate without lower urinary tract symptoms: Secondary | ICD-10-CM | POA: Diagnosis present

## 2015-02-12 DIAGNOSIS — J309 Allergic rhinitis, unspecified: Secondary | ICD-10-CM | POA: Diagnosis present

## 2015-02-12 HISTORY — DX: Type 2 diabetes mellitus without complications: E11.9

## 2015-02-12 HISTORY — PX: HEMILAMINOTOMY LUMBAR SPINE: SUR654

## 2015-02-12 HISTORY — PX: LUMBAR LAMINECTOMY/DECOMPRESSION MICRODISCECTOMY: SHX5026

## 2015-02-12 LAB — GLUCOSE, CAPILLARY
GLUCOSE-CAPILLARY: 149 mg/dL — AB (ref 70–99)
Glucose-Capillary: 153 mg/dL — ABNORMAL HIGH (ref 70–99)

## 2015-02-12 SURGERY — LUMBAR LAMINECTOMY/DECOMPRESSION MICRODISCECTOMY 2 LEVELS
Anesthesia: General | Site: Back

## 2015-02-12 MED ORDER — PROMETHAZINE HCL 25 MG/ML IJ SOLN: 6.2500 mg | INTRAMUSCULAR | Status: DC | PRN

## 2015-02-12 MED ORDER — SODIUM CHLORIDE 0.9 % IJ SOLN: 3.0000 mL | INTRAMUSCULAR | Status: DC | PRN

## 2015-02-12 MED ORDER — METOPROLOL SUCCINATE ER 25 MG PO TB24
50.0000 mg | ORAL_TABLET | Freq: Every day | ORAL | Status: DC
  Administered 2015-02-12: 50 mg via ORAL
  Filled 2015-02-12: qty 2

## 2015-02-12 MED ORDER — HYPROMELLOSE (GONIOSCOPIC) 2.5 % OP SOLN: 1.0000 [drp] | Freq: Three times a day (TID) | OPHTHALMIC | Status: DC

## 2015-02-12 MED ORDER — ONDANSETRON HCL 4 MG/2ML IJ SOLN
INTRAMUSCULAR | Status: DC | PRN
  Administered 2015-02-12: 4 mg via INTRAVENOUS

## 2015-02-12 MED ORDER — PROPOFOL 10 MG/ML IV BOLUS
INTRAVENOUS | Status: AC
  Filled 2015-02-12: qty 20

## 2015-02-12 MED ORDER — SODIUM CHLORIDE 0.9 % IV SOLN: 250.0000 mL | INTRAVENOUS | Status: DC

## 2015-02-12 MED ORDER — MORPHINE SULFATE 2 MG/ML IJ SOLN: 1.0000 mg | INTRAMUSCULAR | Status: DC | PRN

## 2015-02-12 MED ORDER — MENTHOL 3 MG MT LOZG
1.0000 | LOZENGE | OROMUCOSAL | Status: DC | PRN
Start: 2015-02-12 — End: 2015-02-13

## 2015-02-12 MED ORDER — FENTANYL CITRATE (PF) 100 MCG/2ML IJ SOLN
INTRAMUSCULAR | Status: DC | PRN
  Administered 2015-02-12: 50 ug via INTRAVENOUS
  Administered 2015-02-12 (×2): 100 ug via INTRAVENOUS

## 2015-02-12 MED ORDER — ONDANSETRON HCL 4 MG/2ML IJ SOLN
INTRAMUSCULAR | Status: AC
  Filled 2015-02-12: qty 2

## 2015-02-12 MED ORDER — ACETAMINOPHEN 650 MG RE SUPP: 650.0000 mg | RECTAL | Status: DC | PRN

## 2015-02-12 MED ORDER — THROMBIN 5000 UNITS EX SOLR
OROMUCOSAL | Status: DC | PRN
  Administered 2015-02-12: 11:00:00 via TOPICAL

## 2015-02-12 MED ORDER — ASPIRIN EC 81 MG PO TBEC
162.0000 mg | DELAYED_RELEASE_TABLET | Freq: Every day | ORAL | Status: DC
  Administered 2015-02-12: 162 mg via ORAL
  Filled 2015-02-12 (×2): qty 2

## 2015-02-12 MED ORDER — ACETAMINOPHEN 325 MG PO TABS: 650.0000 mg | ORAL_TABLET | ORAL | Status: DC | PRN

## 2015-02-12 MED ORDER — KETOROLAC TROMETHAMINE 30 MG/ML IJ SOLN: 30.0000 mg | Freq: Once | INTRAMUSCULAR | Status: DC | PRN

## 2015-02-12 MED ORDER — BACITRACIN ZINC 500 UNIT/GM EX OINT
TOPICAL_OINTMENT | CUTANEOUS | Status: DC | PRN
  Administered 2015-02-12: 1 via TOPICAL

## 2015-02-12 MED ORDER — LIDOCAINE HCL (CARDIAC) 20 MG/ML IV SOLN
INTRAVENOUS | Status: DC | PRN
  Administered 2015-02-12: 50 mg via INTRAVENOUS

## 2015-02-12 MED ORDER — THROMBIN 5000 UNITS EX SOLR
CUTANEOUS | Status: DC | PRN
  Administered 2015-02-12 (×2): 5000 [IU] via TOPICAL

## 2015-02-12 MED ORDER — HYDROMORPHONE HCL 1 MG/ML IJ SOLN: 0.2500 mg | INTRAMUSCULAR | Status: DC | PRN

## 2015-02-12 MED ORDER — 0.9 % SODIUM CHLORIDE (POUR BTL) OPTIME
TOPICAL | Status: DC | PRN
  Administered 2015-02-12: 1000 mL

## 2015-02-12 MED ORDER — PHENYLEPHRINE HCL 10 MG/ML IJ SOLN
INTRAMUSCULAR | Status: DC | PRN
  Administered 2015-02-12 (×3): 40 ug via INTRAVENOUS

## 2015-02-12 MED ORDER — POTASSIUM CHLORIDE IN NACL 20-0.9 MEQ/L-% IV SOLN
INTRAVENOUS | Status: DC
  Administered 2015-02-12: 19:00:00 via INTRAVENOUS
  Filled 2015-02-12 (×2): qty 1000

## 2015-02-12 MED ORDER — ISOSORBIDE MONONITRATE ER 30 MG PO TB24
60.0000 mg | ORAL_TABLET | Freq: Every day | ORAL | Status: DC
  Administered 2015-02-12: 60 mg via ORAL
  Filled 2015-02-12: qty 2

## 2015-02-12 MED ORDER — FENTANYL CITRATE (PF) 250 MCG/5ML IJ SOLN
INTRAMUSCULAR | Status: AC
  Filled 2015-02-12: qty 5

## 2015-02-12 MED ORDER — SODIUM CHLORIDE 0.9 % IJ SOLN: 3.0000 mL | Freq: Two times a day (BID) | INTRAMUSCULAR | Status: DC

## 2015-02-12 MED ORDER — LACTATED RINGERS IV SOLN
INTRAVENOUS | Status: DC
  Administered 2015-02-12 (×2): via INTRAVENOUS

## 2015-02-12 MED ORDER — VECURONIUM BROMIDE 10 MG IV SOLR
INTRAVENOUS | Status: DC | PRN
  Administered 2015-02-12: 4 mg via INTRAVENOUS

## 2015-02-12 MED ORDER — OXYCODONE-ACETAMINOPHEN 5-325 MG PO TABS
1.0000 | ORAL_TABLET | ORAL | Status: DC | PRN
  Administered 2015-02-12: 2 via ORAL

## 2015-02-12 MED ORDER — HEMOSTATIC AGENTS (NO CHARGE) OPTIME
TOPICAL | Status: DC | PRN
  Administered 2015-02-12: 1 via TOPICAL

## 2015-02-12 MED ORDER — GLYCOPYRROLATE 0.2 MG/ML IJ SOLN
INTRAMUSCULAR | Status: DC | PRN
  Administered 2015-02-12: 0.4 mg via INTRAVENOUS

## 2015-02-12 MED ORDER — MORPHINE SULFATE 4 MG/ML IJ SOLN
INTRAMUSCULAR | Status: AC
  Administered 2015-02-12: 4 mg
  Filled 2015-02-12: qty 1

## 2015-02-12 MED ORDER — MEPERIDINE HCL 25 MG/ML IJ SOLN: 6.2500 mg | INTRAMUSCULAR | Status: DC | PRN

## 2015-02-12 MED ORDER — PIOGLITAZONE HCL 15 MG PO TABS
15.0000 mg | ORAL_TABLET | Freq: Two times a day (BID) | ORAL | Status: DC
  Administered 2015-02-12: 15 mg via ORAL
  Filled 2015-02-12 (×3): qty 1

## 2015-02-12 MED ORDER — PROPOFOL 10 MG/ML IV BOLUS
INTRAVENOUS | Status: DC | PRN
  Administered 2015-02-12 (×2): 30 mg via INTRAVENOUS
  Administered 2015-02-12: 40 mg via INTRAVENOUS
  Administered 2015-02-12: 100 mg via INTRAVENOUS

## 2015-02-12 MED ORDER — TAMSULOSIN HCL 0.4 MG PO CAPS
0.4000 mg | ORAL_CAPSULE | Freq: Every day | ORAL | Status: DC
  Administered 2015-02-13: 0.4 mg via ORAL
  Filled 2015-02-12: qty 1

## 2015-02-12 MED ORDER — OXYCODONE-ACETAMINOPHEN 5-325 MG PO TABS
ORAL_TABLET | ORAL | Status: AC
  Administered 2015-02-12: 2 via ORAL
  Filled 2015-02-12: qty 2

## 2015-02-12 MED ORDER — PHENOL 1.4 % MT LIQD: 1.0000 | OROMUCOSAL | Status: DC | PRN

## 2015-02-12 MED ORDER — ONDANSETRON HCL 4 MG/2ML IJ SOLN: 4.0000 mg | INTRAMUSCULAR | Status: DC | PRN

## 2015-02-12 MED ORDER — BUPIVACAINE HCL (PF) 0.25 % IJ SOLN
INTRAMUSCULAR | Status: DC | PRN
  Administered 2015-02-12: 2 mL

## 2015-02-12 MED ORDER — NEOSTIGMINE METHYLSULFATE 10 MG/10ML IV SOLN
INTRAVENOUS | Status: DC | PRN
  Administered 2015-02-12: 3 mg via INTRAVENOUS

## 2015-02-12 MED ORDER — CEFAZOLIN SODIUM 1-5 GM-% IV SOLN
1.0000 g | Freq: Three times a day (TID) | INTRAVENOUS | Status: AC
  Administered 2015-02-12 – 2015-02-13 (×2): 1 g via INTRAVENOUS
  Filled 2015-02-12 (×3): qty 50

## 2015-02-12 MED ORDER — ACETAMINOPHEN 10 MG/ML IV SOLN
1000.0000 mg | Freq: Once | INTRAVENOUS | Status: AC
  Administered 2015-02-12: 1000 mg via INTRAVENOUS
  Filled 2015-02-12: qty 100

## 2015-02-12 MED ORDER — HYPROMELLOSE (GONIOSCOPIC) 2.5 % OP SOLN
1.0000 [drp] | Freq: Three times a day (TID) | OPHTHALMIC | Status: DC
Start: 2015-02-12 — End: 2015-02-13
  Administered 2015-02-12: 1 [drp] via OPHTHALMIC
  Filled 2015-02-12 (×2): qty 15

## 2015-02-12 MED ORDER — METFORMIN HCL 500 MG PO TABS
500.0000 mg | ORAL_TABLET | Freq: Two times a day (BID) | ORAL | Status: DC
Start: 2015-02-12 — End: 2015-02-13
  Administered 2015-02-12 – 2015-02-13 (×2): 500 mg via ORAL
  Filled 2015-02-12 (×2): qty 1

## 2015-02-12 MED ORDER — ACETAMINOPHEN 10 MG/ML IV SOLN
INTRAVENOUS | Status: AC
Start: 2015-02-12 — End: 2015-02-12
  Administered 2015-02-12: 1000 mg via INTRAVENOUS
  Filled 2015-02-12: qty 100

## 2015-02-12 MED ORDER — PHENYLEPHRINE HCL 10 MG/ML IJ SOLN
20.0000 mg | INTRAVENOUS | Status: DC | PRN
  Administered 2015-02-12: 20 ug via INTRAVENOUS

## 2015-02-12 MED ORDER — LIDOCAINE HCL (CARDIAC) 20 MG/ML IV SOLN
INTRAVENOUS | Status: AC
  Filled 2015-02-12: qty 5

## 2015-02-12 SURGICAL SUPPLY — 49 items
APL SKNCLS STERI-STRIP NONHPOA (GAUZE/BANDAGES/DRESSINGS) ×1
BAG DECANTER FOR FLEXI CONT (MISCELLANEOUS) ×2 IMPLANT
BENZOIN TINCTURE PRP APPL 2/3 (GAUZE/BANDAGES/DRESSINGS) ×2 IMPLANT
BUR MATCHSTICK NEURO 3.0 LAGG (BURR) ×2 IMPLANT
CANISTER SUCT 3000ML PPV (MISCELLANEOUS) ×2 IMPLANT
CONT SPEC 4OZ CLIKSEAL STRL BL (MISCELLANEOUS) ×2 IMPLANT
DRAPE LAPAROTOMY 100X72X124 (DRAPES) ×2 IMPLANT
DRAPE MICROSCOPE LEICA (MISCELLANEOUS) ×2 IMPLANT
DRAPE POUCH INSTRU U-SHP 10X18 (DRAPES) ×2 IMPLANT
DRAPE PROXIMA HALF (DRAPES) ×1 IMPLANT
DRAPE SURG 17X23 STRL (DRAPES) ×2 IMPLANT
DRSG OPSITE 4X5.5 SM (GAUZE/BANDAGES/DRESSINGS) ×2 IMPLANT
DRSG OPSITE POSTOP 4X6 (GAUZE/BANDAGES/DRESSINGS) ×1 IMPLANT
DRSG TELFA 3X8 NADH (GAUZE/BANDAGES/DRESSINGS) ×2 IMPLANT
DURAPREP 26ML APPLICATOR (WOUND CARE) ×2 IMPLANT
ELECT REM PT RETURN 9FT ADLT (ELECTROSURGICAL) ×2
ELECTRODE REM PT RTRN 9FT ADLT (ELECTROSURGICAL) ×1 IMPLANT
GAUZE SPONGE 4X4 16PLY XRAY LF (GAUZE/BANDAGES/DRESSINGS) IMPLANT
GLOVE BIO SURGEON STRL SZ8 (GLOVE) ×2 IMPLANT
GLOVE ECLIPSE 8.0 STRL XLNG CF (GLOVE) ×1 IMPLANT
GLOVE INDICATOR 7.5 STRL GRN (GLOVE) ×2 IMPLANT
GLOVE SURG SS PI 7.5 STRL IVOR (GLOVE) ×3 IMPLANT
GOWN STRL REUS W/ TWL LRG LVL3 (GOWN DISPOSABLE) IMPLANT
GOWN STRL REUS W/ TWL XL LVL3 (GOWN DISPOSABLE) ×1 IMPLANT
GOWN STRL REUS W/TWL 2XL LVL3 (GOWN DISPOSABLE) IMPLANT
GOWN STRL REUS W/TWL LRG LVL3 (GOWN DISPOSABLE)
GOWN STRL REUS W/TWL XL LVL3 (GOWN DISPOSABLE) ×2
HEMOSTAT POWDER KIT SURGIFOAM (HEMOSTASIS) IMPLANT
KIT BASIN OR (CUSTOM PROCEDURE TRAY) ×2 IMPLANT
KIT ROOM TURNOVER OR (KITS) ×2 IMPLANT
NDL HYPO 25X1 1.5 SAFETY (NEEDLE) ×1 IMPLANT
NDL SPNL 20GX3.5 QUINCKE YW (NEEDLE) IMPLANT
NEEDLE HYPO 25X1 1.5 SAFETY (NEEDLE) ×2 IMPLANT
NEEDLE SPNL 20GX3.5 QUINCKE YW (NEEDLE) IMPLANT
NS IRRIG 1000ML POUR BTL (IV SOLUTION) ×2 IMPLANT
PACK LAMINECTOMY NEURO (CUSTOM PROCEDURE TRAY) ×2 IMPLANT
PAD ARMBOARD 7.5X6 YLW CONV (MISCELLANEOUS) ×6 IMPLANT
PAD DRESSING TELFA 3X8 NADH (GAUZE/BANDAGES/DRESSINGS) ×1 IMPLANT
RUBBERBAND STERILE (MISCELLANEOUS) ×4 IMPLANT
SPONGE SURGIFOAM ABS GEL SZ50 (HEMOSTASIS) ×2 IMPLANT
STRIP CLOSURE SKIN 1/2X4 (GAUZE/BANDAGES/DRESSINGS) ×2 IMPLANT
SUT VIC AB 0 CT1 18XCR BRD8 (SUTURE) ×1 IMPLANT
SUT VIC AB 0 CT1 8-18 (SUTURE) ×2
SUT VIC AB 2-0 CP2 18 (SUTURE) ×2 IMPLANT
SUT VIC AB 3-0 SH 8-18 (SUTURE) ×2 IMPLANT
SYR 20ML ECCENTRIC (SYRINGE) ×2 IMPLANT
TOWEL OR 17X24 6PK STRL BLUE (TOWEL DISPOSABLE) ×2 IMPLANT
TOWEL OR 17X26 10 PK STRL BLUE (TOWEL DISPOSABLE) ×2 IMPLANT
WATER STERILE IRR 1000ML POUR (IV SOLUTION) ×2 IMPLANT

## 2015-02-12 NOTE — Anesthesia Procedure Notes (Signed)
Procedure Name: Intubation Date/Time: 02/12/2015 11:49 AM Performed by: Lovie CholOCK, Erastus Bartolomei K Pre-anesthesia Checklist: Patient identified, Emergency Drugs available, Suction available, Patient being monitored and Timeout performed Patient Re-evaluated:Patient Re-evaluated prior to inductionOxygen Delivery Method: Circle system utilized Preoxygenation: Pre-oxygenation with 100% oxygen Intubation Type: IV induction and Cricoid Pressure applied Ventilation: Mask ventilation without difficulty and Oral airway inserted - appropriate to patient size Laryngoscope Size: Miller and 2 Grade View: Grade III Tube type: Oral Tube size: 7.5 mm Number of attempts: 1 Airway Equipment and Method: Stylet Placement Confirmation: ETT inserted through vocal cords under direct vision,  positive ETCO2,  CO2 detector and breath sounds checked- equal and bilateral Secured at: 22 cm Tube secured with: Tape Dental Injury: Teeth and Oropharynx as per pre-operative assessment

## 2015-02-12 NOTE — H&P (Signed)
Subjective: Patient is a 81 y.o. male admitted for spinal stenosis. Onset of symptoms was several months ago, gradually worsening since that time.  The pain is rated severe, and is located at the across the lower back and radiates to legs. The pain is described as aching and occurs all day. The symptoms have been progressive. Symptoms are exacerbated by exercise. MRI or CT showed stenosis L1-2 L2-3.   Past Medical History  Diagnosis Date  . CAD (coronary artery disease)   . Mixed hyperlipidemia   . Low back pain   . Insomnia, unspecified   . Discoid lupus   . Allergic rhinitis   . BPH (benign prostatic hyperplasia)   . Constipated   . Calculus of kidney   . Hearing loss   . Amnesia, global, transient     Episode was very brief & not associated with other neurlogical findings.  . Type II or unspecified type diabetes mellitus without mention of complication, not stated as uncontrolled   . Overweight(278.02)   . Dizziness     With standing but could not document orthostasis today  . Type II or unspecified type diabetes mellitus with neurological manifestations, not stated as uncontrolled   . Polyneuropathy in diabetes(357.2)   . Other and unspecified hyperlipidemia   . Coronary atherosclerosis     With sequential SVG to D#1 and #2, sequential SVG to OM #1 and #2, SVG to PDA, and LIMA to LAD. Patent grafts 2205 and no ischemia by Nuc 09/2009 [name])  . Triggering of finger     Left thumb and right fourth finger  . Periarthritis 2001    Left shoulder  . Fungal infection of toenail   . Cataract   . Stroke 01/2011    R BG CVA on MRI, MRA occluded R vertebral artery  . Balance problem   . Essential hypertension, benign     followed by Dr. Mendel Ryder - last seen late March, 2016, he wishes for the pt. to have further testing prior to clearing for surgery   . Osteoarthrosis, unspecified whether generalized or localized, lower leg     knees, back   . Meralgia paresthetica 1997    Past  Surgical History  Procedure Laterality Date  . Coronary artery bypass graft  1997  . Back surgery      Lentigo back  . Spine surgery  2006    spinal stenosis surgery  . Lesion removal      Benign lesions  . Tonsillectomy    . Inguinal hernia repair Right 1997  . Total knee arthroplasty Left about 2005    [name]  . Total knee arthroplasty Right 07/29/11  . Coronary angioplasty with stent placement      With sequential SVG to D#1 and #2, sequential SVG to OM #1 and #2, SVG to PDA, and LIMA to LAD. Patent grafts 2205 and no ischemia by Nuc 09/2009 [name])  . Lithotripsy      [name]    Prior to Admission medications   Medication Sig Start Date End Date Taking? Authorizing Provider  aspirin EC 81 MG tablet Take 162 mg by mouth daily.   Yes Historical Provider, MD  cholecalciferol (VITAMIN D) 1000 UNITS tablet Take 1,000 Units by mouth daily.   Yes Historical Provider, MD  Coenzyme Q10 (CO Q 10) 10 MG CAPS Take 10 mg by mouth daily.   Yes Historical Provider, MD  fluocinonide cream (LIDEX) 0.05 % Apply 1 application topically as needed (contact dermatitis).  Yes Historical Provider, MD  hydroxypropyl methylcellulose (ISOPTO TEARS) 2.5 % ophthalmic solution 1 drop 3 (three) times daily.   Yes Historical Provider, MD  isosorbide mononitrate (IMDUR) 60 MG 24 hr tablet TAKE 1 TABLET BY MOUTH ONCE DAILY 09/12/14  Yes Lyn RecordsHenry W Smith, MD  Melatonin 5 MG TABS Take 1 tablet by mouth as needed.   Yes Historical Provider, MD  metFORMIN (GLUCOPHAGE) 500 MG tablet Take 500 mg by mouth 2 (two) times daily with a meal.   Yes Historical Provider, MD  metoprolol succinate (TOPROL-XL) 50 MG 24 hr tablet Take 50 mg by mouth daily after supper. Take with or immediately following a meal.   Yes Historical Provider, MD  nitroGLYCERIN (NITROSTAT) 0.4 MG SL tablet Place 0.4 mg under the tongue every 5 (five) minutes as needed for chest pain.   Yes Historical Provider, MD  Omega-3 Fatty Acids (FISH OIL) 1200 MG  CAPS Take 1 capsule by mouth 2 (two) times daily.   Yes Historical Provider, MD  pioglitazone (ACTOS) 15 MG tablet Take 15 mg by mouth 2 (two) times daily.   Yes Historical Provider, MD  polyethylene glycol (MIRALAX / GLYCOLAX) packet Take 17 g by mouth at bedtime as needed.   Yes Historical Provider, MD  pravastatin (PRAVACHOL) 20 MG tablet Take 20 mg by mouth daily.   Yes Historical Provider, MD  tamsulosin (FLOMAX) 0.4 MG CAPS capsule Take 1 capsule 30 minutes after the same meal each day   Yes Historical Provider, MD   Allergies  Allergen Reactions  . Simvastatin Other (See Comments)    Leg cramps  . Sporanos [Itraconazole] Rash    History  Substance Use Topics  . Smoking status: Former Games developermoker  . Smokeless tobacco: Former NeurosurgeonUser    Quit date: 02/06/1963  . Alcohol Use: Yes     Comment: Socially- one cocktail before dinner q night     Family History  Problem Relation Age of Onset  . Thrombosis Mother     coronary  . Hypertension Mother   . Congestive Heart Failure Mother   . Heart failure Father   . Congestive Heart Failure Father   . Down syndrome Brother   . Pneumonia Brother   . Cancer Maternal Grandmother     stomach  . Heart failure Other     Fam Hx of heart failure  . Stroke Other     Fam Hx of CVA     Review of Systems  Positive ROS: neg  All other systems have been reviewed and were otherwise negative with the exception of those mentioned in the HPI and as above.  Objective: Vital signs in last 24 hours:    General Appearance: Alert, cooperative, no distress, appears stated age Head: Normocephalic, without obvious abnormality, atraumatic Eyes: PERRL, conjunctiva/corneas clear, EOM's intact    Neck: Supple, symmetrical, trachea midline Back: Symmetric, no curvature, ROM normal, no CVA tenderness Lungs:  respirations unlabored Heart: Regular rate and rhythm Abdomen: Soft, non-tender Extremities: Extremities normal, atraumatic, no cyanosis or  edema Pulses: 2+ and symmetric all extremities Skin: Skin color, texture, turgor normal, no rashes or lesions  NEUROLOGIC:   Mental status: Alert and oriented x4,  no aphasia, good attention span, fund of knowledge, and memory Motor Exam - grossly normal Sensory Exam - grossly normal Reflexes: 1+ Coordination - grossly normal Gait - grossly normal Balance - grossly normal Cranial Nerves: I: smell Not tested  II: visual acuity  OS: nl    OD: nl  II: visual fields Full to confrontation  II: pupils Equal, round, reactive to light  III,VII: ptosis None  III,IV,VI: extraocular muscles  Full ROM  V: mastication Normal  V: facial light touch sensation  Normal  V,VII: corneal reflex  Present  VII: facial muscle function - upper  Normal  VII: facial muscle function - lower Normal  VIII: hearing Not tested  IX: soft palate elevation  Normal  IX,X: gag reflex Present  XI: trapezius strength  5/5  XI: sternocleidomastoid strength 5/5  XI: neck flexion strength  5/5  XII: tongue strength  Normal    Data Review Lab Results  Component Value Date   WBC 6.7 02/06/2015   HGB 12.0* 02/06/2015   HCT 36.5* 02/06/2015   MCV 101.1* 02/06/2015   PLT 169 02/06/2015   Lab Results  Component Value Date   NA 140 02/06/2015   K 4.4 02/06/2015   CL 103 02/06/2015   CO2 28 02/06/2015   BUN 21 02/06/2015   CREATININE 0.96 02/06/2015   GLUCOSE 145* 02/06/2015   Lab Results  Component Value Date   INR 1.00 02/06/2015    Assessment/Plan: Patient admitted for decompressive lum lam L1-2, L2-3. Patient has failed a reasonable attempt at conservative therapy.  I explained the condition and procedure to the patient and answered any questions.  Patient wishes to proceed with procedure as planned. Understands risks/ benefits and typical outcomes of procedure.   Sekou Zuckerman S 02/12/2015 10:02 AM

## 2015-02-12 NOTE — Progress Notes (Signed)
Rt radial aline removed intact.  Pressure held x 10 min. Pressure dsg applied.  Site u. Willmonitor.

## 2015-02-12 NOTE — Anesthesia Postprocedure Evaluation (Signed)
Anesthesia Post Note  Patient: Gregory LeepJohn H Barry  Procedure(s) Performed: Procedure(s) (LRB): Laminectomy - Lumbar one -two lumbar two -three (N/A)  Anesthesia type: General  Patient location: PACU  Post pain: Pain level controlled  Post assessment: Post-op Vital signs reviewed  Last Vitals: BP 146/61 mmHg  Pulse 76  Temp(Src) 36.6 C (Oral)  Resp 17  Ht 5\' 7"  (1.702 m)  Wt 177 lb 9.6 oz (80.559 kg)  BMI 27.81 kg/m2  SpO2 100%  Post vital signs: Reviewed  Level of consciousness: sedated  Complications: No apparent anesthesia complications

## 2015-02-12 NOTE — Anesthesia Preprocedure Evaluation (Addendum)
Anesthesia Evaluation  Patient identified by MRN, date of birth, ID band Patient awake    Reviewed: Allergy & Precautions, NPO status , Patient's Chart, lab work & pertinent test results, reviewed documented beta blocker date and time   History of Anesthesia Complications (+) history of anesthetic complications  Airway Mallampati: II  TM Distance: >3 FB Neck ROM: Limited    Dental no notable dental hx. (+) Teeth Intact, Dental Advisory Given   Pulmonary neg pulmonary ROS, former smoker,  breath sounds clear to auscultation  Pulmonary exam normal       Cardiovascular hypertension, Pt. on medications and Pt. on home beta blockers + CAD, + Cardiac Stents and + CABG Rhythm:Regular Rate:Normal  Echo 02/09/2015 - Left ventricle: The cavity size was normal. Systolic function was normal. The estimated ejection fraction was in the range of 55% to 60%. Wall motion was normal; there were no regional wall motion abnormalities. There was an increased relative contribution of atrial contraction to ventricular filling. Doppler parameters are consistent with abnormal left ventricular relaxation (grade 1 diastolic dysfunction). - Aortic valve: Moderate thickening and calcification, consistent with sclerosis. Valve mobility was moderately restricted. There was moderate stenosis. Mean gradient (S): 23 mm Hg. Peak gradient (S): 43 mm Hg. Valve area (VTI): 1.23 cm^2. Valve area (Vmax):1.1 cm^2. Valve area (Vmean): 1.2 cm^2. - Mitral valve: Moderate mitral annular calcification of the posterior mitral valve annulus. Valve area by pressure half-time: 2.27 cm^2. Valve area by continuity equation (using LVOT flow): 2.31 cm^2. - Pulmonic valve: There was trivial regurgitation.    Neuro/Psych TIAnegative psych ROS   GI/Hepatic negative GI ROS, Neg liver ROS,   Endo/Other  negative endocrine ROSdiabetes, Type 2  Renal/GU Renal diseasenegative Renal ROS   negative genitourinary   Musculoskeletal negative musculoskeletal ROS (+)   Abdominal   Peds negative pediatric ROS (+)  Hematology negative hematology ROS (+)   Anesthesia Other Findings   Reproductive/Obstetrics negative OB ROS                          Anesthesia Physical Anesthesia Plan  ASA: III  Anesthesia Plan: General   Post-op Pain Management:    Induction: Intravenous  Airway Management Planned: Oral ETT  Additional Equipment: Arterial line  Intra-op Plan:   Post-operative Plan: Extubation in OR  Informed Consent: I have reviewed the patients History and Physical, chart, labs and discussed the procedure including the risks, benefits and alternatives for the proposed anesthesia with the patient or authorized representative who has indicated his/her understanding and acceptance.   Dental advisory given  Plan Discussed with: CRNA  Anesthesia Plan Comments:        Anesthesia Quick Evaluation

## 2015-02-12 NOTE — Transfer of Care (Signed)
Immediate Anesthesia Transfer of Care Note  Patient: Gregory Barry  Procedure(s) Performed: Procedure(s): Laminectomy - Lumbar one -two lumbar two -three (N/A)  Patient Location: PACU  Anesthesia Type:General  Level of Consciousness: awake, oriented and patient cooperative  Airway & Oxygen Therapy: Patient Spontanous Breathing and Patient connected to nasal cannula oxygen  Post-op Assessment: Report given to RN and Post -op Vital signs reviewed and stable  Post vital signs: Reviewed  Last Vitals:  Filed Vitals:   02/12/15 1004  BP: 188/63  Pulse: 83  Temp: 36.3 C  Resp: 20    Complications: No apparent anesthesia complications

## 2015-02-12 NOTE — Op Note (Signed)
02/12/2015  1:26 PM  PATIENT:  Gregory LeepJohn H Barry  81 y.o. male  PRE-OPERATIVE DIAGNOSIS:  Lumbar spinal stenosis L1-2 and L2-3  POST-OPERATIVE DIAGNOSIS:  same  PROCEDURE:  Decompressive lumbar hemilaminectomy, medial facetectomy, foraminotomies at L1-2 and L2-3 on the left followed by sublaminar decompression  SURGEON:  Marikay Alaravid Corrine Tillis, MD  ASSISTANTS: Dr. Hali Marryritzer  ANESTHESIA:   General  EBL: 50 ml  Total I/O In: 700 [I.V.:700] Out: 50 [Blood:50]  BLOOD ADMINISTERED:none  DRAINS: none   SPECIMEN:  No Specimen  INDICATION FOR PROCEDURE: this patient presented with back and bilateral leg pain with ambulation consistent with claudication. MRI showed spinal stenosis at L1-2 and L2-3. He had previous lumbar surgery at L3-4 and L4-5 and had postlaminectomy spondylolisthesis without obvious significant stenosis. Medical management without relief. I recommended decompressive laminectomies at L1-2 and L2-3. Patient understood the risks, benefits, and alternatives and potential outcomes and wished to proceed.  PROCEDURE DETAILS: The patient was taken to the operating room and after induction of adequate generalized endotracheal anesthesia, the patient was rolled into the prone position on the Wilson frame and all pressure points were padded. The lumbar region was cleaned and then prepped with DuraPrep and draped in the usual sterile fashion. 5 cc of local anesthesia was injected and then a dorsal midline incision was made and carried down to the lumbo sacral fascia. The fascia was opened and the paraspinous musculature was taken down in a subperiosteal fashion to expose L1-2 and L2-3 on the left. Intraoperative x-ray confirmed my level, and then I used a combination of the high-speed drill and the Kerrison punches to perform a hemilaminectomy, medial facetectomy, and foraminotomy at L1-2 and L2-3 on the left. The underlying yellow ligament was opened and removed in a piecemeal fashion to expose the  underlying dura and exiting nerve root. There was significantly overgrown ligament causing severe stenosis at each level. I undercut the lateral recess and dissected down until I was medial to and distal to the pedicleon the left at each level. The nerve root was well decompressed. I drilled up under the spinous process at each level and then performed a sublaminar decompression until I identified the lateral recess and lateral edge of the dura on the opposite side as well as the opposite pedicle. I then palpated with a coronary dilator along the nerve root and into the foramen to assure adequate decompression. I felt no more compression of the nerve root. I irrigated with saline solution containing bacitracin. Achieved hemostasis with bipolar cautery, lined the dura with Gelfoam, and then closed the fascia with 0 Vicryl. I closed the subcutaneous tissues with 2-0 Vicryl and the subcuticular tissues with 3-0 Vicryl. The skin was then closed with benzoin and Steri-Strips. The drapes were removed, a sterile dressing was applied. The patient was awakened from general anesthesia and transferred to the recovery room in stable condition. At the end of the procedure all sponge, needle and instrument counts were correct.   PLAN OF CARE: Admit for overnight observation  PATIENT DISPOSITION:  PACU - hemodynamically stable.   Delay start of Pharmacological VTE agent (>24hrs) due to surgical blood loss or risk of bleeding:  yes

## 2015-02-13 ENCOUNTER — Encounter (HOSPITAL_COMMUNITY): Payer: Self-pay | Admitting: Neurological Surgery

## 2015-02-13 MED ORDER — OXYCODONE-ACETAMINOPHEN 5-325 MG PO TABS: 1.0000 | ORAL_TABLET | Freq: Four times a day (QID) | ORAL | Status: DC | PRN

## 2015-02-13 NOTE — Progress Notes (Signed)
Met with patient and spouse to review discharge instructions. Both verbalize understanding. One rx. given and follow up appt with Dr. Jones is scheduled. Rolling walker has been delivered to room. Escorted to lobby at 1245 and transportation home provided by spouse.    

## 2015-02-13 NOTE — Plan of Care (Signed)
Problem: Consults Goal: Diagnosis - Spinal Surgery Outcome: Completed/Met Date Met:  02/13/15 Lumbar Laminectomy (Complex)

## 2015-02-13 NOTE — Progress Notes (Signed)
Rolling walker order for discharge and to be delivered to room today prior to discharging home today; Alexis GoodellB Joaquim Tolen RN,BSN,MHA [phone]

## 2015-02-13 NOTE — Discharge Summary (Signed)
Physician Discharge Summary  Patient ID: Gregory Barry MRN: [id number] DOB/AGE: 02-19-1921 81 y.o.  Admit date: 02/12/2015 Discharge date: 02/13/2015  Admission Diagnoses: Lumbar spinal stenosis   Discharge Diagnoses: Same   Discharged Condition: good  Hospital Course: The patient was admitted on 02/12/2015 and taken to the operating room where the patient underwent decompressive lumbar laminectomy L1-L2 3. The patient tolerated the procedure well and was taken to the recovery room and then to the floor in stable condition. The hospital course was routine. There were no complications. The wound remained clean dry and intact. Pt had appropriate back soreness. No complaints of leg pain or new N/T/W. The patient remained afebrile with stable vital signs, and tolerated a regular diet. The patient continued to increase activities, and pain was well controlled with oral pain medications.   Consults: None  Significant Diagnostic Studies:  Results for orders placed or performed during the hospital encounter of 02/12/15  Glucose, capillary  Result Value Ref Range   Glucose-Capillary 149 (H) 70 - 99 mg/dL  Glucose, capillary  Result Value Ref Range   Glucose-Capillary 153 (H) 70 - 99 mg/dL   Comment 1 Notify RN    Comment 2 Document in Chart     Chest 2 View  02/06/2015   CLINICAL DATA:  Preop for lumbar surgery  EXAM: CHEST  2 VIEW  COMPARISON:  07/25/2011  FINDINGS: Cardiomediastinal silhouette is stable. Mild hyperinflation again noted. Status post median sternotomy. No acute infiltrate or pulmonary edema. Degenerative changes thoracic spine.  IMPRESSION: No active cardiopulmonary disease.   Electronically Signed   By: Natasha Mead M.D.   On: 02/06/2015 16:34   Dg Lumbar Spine 2-3 Views  02/12/2015   CLINICAL DATA:  Spinal stenosis.  Spondylolisthesis.  EXAM: LUMBAR SPINE - 2-3 VIEW  COMPARISON:  MRI dated 11/21/2014  FINDINGS: Radiograph 1 demonstrates a needle at the level of the pedicles of  L3. Radiograph  1. Demonstrates instruments at the L1-2 and L2-3 levels. Instruments at L1-2 and L2-3.  IMPRESSION: Negative.   Electronically Signed   By: Francene Boyers M.D.   On: 02/12/2015 13:48    Antibiotics:  Anti-infectives    Start     Dose/Rate Route Frequency Ordered Stop   02/12/15 1730  ceFAZolin (ANCEF) IVPB 1 g/50 mL premix     1 g 100 mL/hr over 30 Minutes Intravenous Every 8 hours 02/12/15 1729 02/13/15 0929   02/12/15 1130  ceFAZolin (ANCEF) IVPB 2 g/50 mL premix     2 g 100 mL/hr over 30 Minutes Intravenous To Neuro OR-Station #32 02/11/15 1415 02/12/15 1156      Discharge Exam: Blood pressure 143/60, pulse 69, temperature 98 F (36.7 C), temperature source Oral, resp. rate 19, height  (1.702 m), weight 177 lb 9.6 oz (80.559 kg), SpO2 99 %. Neurologic: Grossly normal Incision clean dry and intact  Discharge Medications:     Medication List    TAKE these medications        aspirin EC 81 MG tablet  Take 162 mg by mouth daily.     cholecalciferol 1000 UNITS tablet  Commonly known as:  VITAMIN D  Take 1,000 Units by mouth daily.     Co Q 10 10 MG Caps  Take 10 mg by mouth daily.     Fish Oil 1200 MG Caps  Take 1 capsule by mouth 2 (two) times daily.     fluocinonide cream 0.05 %  Commonly known as:  LIDEX  Apply 1 application topically as needed (contact dermatitis).     hydroxypropyl methylcellulose / hypromellose 2.5 % ophthalmic solution  Commonly known as:  ISOPTO TEARS / GONIOVISC  1 drop 3 (three) times daily.     isosorbide mononitrate 60 MG 24 hr tablet  Commonly known as:  IMDUR  TAKE 1 TABLET BY MOUTH ONCE DAILY     Melatonin 5 MG Tabs  Take 1 tablet by mouth as needed.     metFORMIN 500 MG tablet  Commonly known as:  GLUCOPHAGE  Take 500 mg by mouth 2 (two) times daily with a meal.     metoprolol succinate 50 MG 24 hr tablet  Commonly known as:  TOPROL-XL  Take 50 mg by mouth daily after supper. Take with or immediately  following a meal.     nitroGLYCERIN 0.4 MG SL tablet  Commonly known as:  NITROSTAT  Place 0.4 mg under the tongue every 5 (five) minutes as needed for chest pain.     oxyCODONE-acetaminophen 5-325 MG per tablet  Commonly known as:  PERCOCET/ROXICET  Take 1 tablet by mouth every 6 (six) hours as needed for moderate pain.     pioglitazone 15 MG tablet  Commonly known as:  ACTOS  Take 15 mg by mouth 2 (two) times daily.     polyethylene glycol packet  Commonly known as:  MIRALAX / GLYCOLAX  Take 17 g by mouth at bedtime as needed.     pravastatin 20 MG tablet  Commonly known as:  PRAVACHOL  Take 20 mg by mouth daily.     tamsulosin 0.4 MG Caps capsule  Commonly known as:  FLOMAX  Take 1 capsule 30 minutes after the same meal each day        Disposition: Home   Final Dx: Lumbar laminectomy L1-L2 3 for stenosis      Discharge Instructions     Remove dressing in 72 hours    Complete by:  As directed      Call MD for:  difficulty breathing, headache or visual disturbances    Complete by:  As directed      Call MD for:  persistant nausea and vomiting    Complete by:  As directed      Call MD for:  redness, tenderness, or signs of infection (pain, swelling, redness, odor or green/yellow discharge around incision site)    Complete by:  As directed      Call MD for:  severe uncontrolled pain    Complete by:  As directed      Call MD for:  temperature >100.4    Complete by:  As directed      Diet - low sodium heart healthy    Complete by:  As directed      Discharge instructions    Complete by:  As directed   No strenuous activity, no heavy lifting, no driving, may shower     Increase activity slowly    Complete by:  As directed            Follow-up Information    Follow up with Melroy Bougher S, MD. Schedule an appointment as soon as possible for a visit in 2 weeks.   Specialty:  Neurosurgery   Contact information:   [address] [place]  [id number] [phone]        Signed: Tia AlertJONES,Michaeljoseph Revolorio S 02/13/2015, 6:47 AM

## 2015-04-13 ENCOUNTER — Other Ambulatory Visit: Payer: Self-pay

## 2015-05-29 ENCOUNTER — Other Ambulatory Visit: Payer: Self-pay | Admitting: Interventional Cardiology

## 2015-08-25 ENCOUNTER — Encounter: Payer: Self-pay | Admitting: Cardiology

## 2015-08-25 ENCOUNTER — Ambulatory Visit (INDEPENDENT_AMBULATORY_CARE_PROVIDER_SITE_OTHER): Payer: Medicare Other | Admitting: Cardiology

## 2015-08-25 ENCOUNTER — Telehealth (HOSPITAL_COMMUNITY): Payer: Self-pay | Admitting: *Deleted

## 2015-08-25 VITALS — BP 128/58 | HR 72 | Ht 67.0 in | Wt 171.0 lb

## 2015-08-25 DIAGNOSIS — I25709 Atherosclerosis of coronary artery bypass graft(s), unspecified, with unspecified angina pectoris: Secondary | ICD-10-CM | POA: Diagnosis not present

## 2015-08-25 DIAGNOSIS — I350 Nonrheumatic aortic (valve) stenosis: Secondary | ICD-10-CM

## 2015-08-25 DIAGNOSIS — I10 Essential (primary) hypertension: Secondary | ICD-10-CM | POA: Diagnosis not present

## 2015-08-25 DIAGNOSIS — I950 Idiopathic hypotension: Secondary | ICD-10-CM

## 2015-08-25 DIAGNOSIS — R079 Chest pain, unspecified: Secondary | ICD-10-CM

## 2015-08-25 NOTE — Telephone Encounter (Signed)
Left message on voicemail in reference to upcoming appointment scheduled for 08/27/15. Phone number given for a call back so details instructions can be given. Gregory Barry W   

## 2015-08-25 NOTE — Patient Instructions (Signed)
Medication Instructions:  Your physician recommends that you continue on your current medications as directed. Please refer to the Current Medication list given to you today.   Labwork: None ordered  Testing/Procedures: Your physician has requested that you have a lexiscan myoview. For further information please visit [email or web address]. Please follow instruction sheet, as given.    Follow-Up: You have a follow up appointment scheduled on 09/15/15 @ 9am with Dr.Smith  Any Other Special Instructions Will Be Listed Below (If Applicable).     If you need a refill on your cardiac medications before your next appointment, please call your pharmacy.

## 2015-08-25 NOTE — Progress Notes (Signed)
Cardiology Office Note   Date:  08/25/2015   ID:  Gregory Barry, DOB 1921-04-15, MRN [id number]  PCP:  Lillia Mountain, MD  Cardiologist:  Dr. [name]    Chief Complaint  Patient presents with  . Follow-up    chest pain-has had pain and pressure; no shortness of breath, no edema; occassional cramping in legs, no pain in legs; no lightheadedness,; no dizziness      History of Present Illness: Gregory Barry is a 81 y.o. male who presents for chest pain.  He has had a prior inferolateral infarct. He had not had angina in several years. He is status post coronary artery bypass grafting in 1997.   Last cath was 2005 with patent vein grafts and LIMA.  Last echo 01/2015 with normal EF and moderate AS. See below.   He presents today with 3 episodes of angina.  The first 2 episodes were brief and at rest.  The 3rd episode was 2 weeks ago and was more significant.  Chest pressure with radiation to neck.  He took 1 NTG and had relief in 1-2 min.  He had no associated symptoms of SOB, nausea or diaphoresis.  He does not appear his stated age.  He is active, still drives but due to chronic back pain does not walk to the mailbox.  He does do water aerobics 3 X week.    Past Medical History  Diagnosis Date  . CAD (coronary artery disease)   . Mixed hyperlipidemia   . Insomnia, unspecified   . Discoid lupus   . Allergic rhinitis   . BPH (benign prostatic hyperplasia)   . Constipated   . Hearing loss   . Amnesia, global, transient     Episode was very brief & not associated with other neurlogical findings.  . Overweight(278.02)   . Dizziness     With standing but could not document orthostasis today  . Polyneuropathy in diabetes(357.2)   . Coronary atherosclerosis     With sequential SVG to D#1 and #2, sequential SVG to OM #1 and #2, SVG to PDA, and LIMA to LAD. Patent grafts 2205 and no ischemia by Nuc 09/2009 [name])  . Triggering of finger     Left thumb and right fourth finger  .  Periarthritis 2001    Left shoulder  . Fungal infection of toenail   . Cataract   . Balance problem   . Essential hypertension, benign     followed by Dr. Mendel Ryder - last seen late March, 2016, he wishes for the pt. to have further testing prior to clearing for surgery   . Meralgia paresthetica 1997  . Type II diabetes mellitus (HCC)   . Pneumonia     "when I was a child"  . Stroke (HCC) 01/2011    R BG CVA on MRI, MRA occluded R vertebral artery  . Osteoarthrosis, unspecified whether generalized or localized, lower leg     knees, back    . Chronic lower back pain   . Calculus of kidney     Past Surgical History  Procedure Laterality Date  . Coronary artery bypass graft  1997    "CABG X5"  . Lumbar laminectomy  2006    "[name]"  . Back surgery    . Lesion removal      Benign lesions  . Tonsillectomy    . Total knee arthroplasty Left ~ 2005    [name]  . Total knee arthroplasty Right 07/29/11  .  Coronary angioplasty with stent placement      With sequential SVG to D#1 and #2, sequential SVG to OM #1 and #2, SVG to PDA, and LIMA to LAD. Patent grafts 2205 and no ischemia by Nuc 09/2009 [name]  . Lithotripsy      [name]  . Hemilaminotomy lumbar spine  02/12/2015    Decompressive lumbar hemilaminectomy, medial facetectomy, foraminotomies at L1-2 and L2-3 on the left followed by sublaminar decompression  . Inguinal hernia repair Right 1997  . Joint replacement    . Lumbar laminectomy/decompression microdiscectomy N/A 02/12/2015    Procedure: Laminectomy - Lumbar one -two lumbar two -three;  Surgeon: Tia Alertavid S Jones, MD;  Location: [facility];  Service: Neurosurgery;  Laterality: N/A;     Current Outpatient Prescriptions  Medication Sig Dispense Refill  . aspirin EC 81 MG tablet Take 162 mg by mouth daily.    . cholecalciferol (VITAMIN D) 1000 UNITS tablet Take 1,000 Units by mouth daily.    . Coenzyme Q10 (CO Q 10) 10 MG CAPS Take 10 mg by mouth daily.    .  fluocinonide cream (LIDEX) 0.05 % Apply 1 application topically as needed (contact dermatitis).    . hydroxypropyl methylcellulose (ISOPTO TEARS) 2.5 % ophthalmic solution 1 drop 3 (three) times daily.    . isosorbide mononitrate (IMDUR) 60 MG 24 hr tablet TAKE 1 TABLET BY MOUTH DAILY 90 tablet 1  . Melatonin 5 MG TABS Take 1 tablet by mouth as needed.    . metFORMIN (GLUCOPHAGE) 500 MG tablet Take 500 mg by mouth 2 (two) times daily with a meal.    . metoprolol succinate (TOPROL-XL) 50 MG 24 hr tablet Take 50 mg by mouth daily after supper. Take with or immediately following a meal.    . nitroGLYCERIN (NITROSTAT) 0.4 MG SL tablet Place 0.4 mg under the tongue every 5 (five) minutes as needed for chest pain.    . Omega-3 Fatty Acids (FISH OIL) 1200 MG CAPS Take 1 capsule by mouth 2 (two) times daily.    . pioglitazone (ACTOS) 15 MG tablet Take 15 mg by mouth 2 (two) times daily.    . polyethylene glycol (MIRALAX / GLYCOLAX) packet Take 17 g by mouth at bedtime as needed.    . pravastatin (PRAVACHOL) 20 MG tablet Take 20 mg by mouth daily.    . tamsulosin (FLOMAX) 0.4 MG CAPS capsule Take 1 capsule 30 minutes after the same meal each day    . FREESTYLE LITE test strip 1 strip by Other route 4 (four) times daily. Use 1 strip to check glucose four times a day  0   No current facility-administered medications for this visit.    Allergies:   Simvastatin and Sporanos    Social History:  The patient  reports that he has quit smoking. His smoking use included Cigarettes. He has a 20 pack-year smoking history. He has never used smokeless tobacco. He reports that he drinks about 3.0 oz of alcohol per week. He reports that he does not use illicit drugs.   Family History:  The patient's family history includes Cancer in his maternal grandmother; Congestive Heart Failure in his father and mother; Down syndrome in his brother; Heart failure in his father and other; Hypertension in his mother; Pneumonia in  his brother; Stroke in his other; Thrombosis in his mother.    ROS:  General:no colds or fevers, no weight changes Skin:no rashes or ulcers HEENT:no blurred vision, no congestion CV:see HPI PUL:see HPI  GI:no diarrhea some constipation takes miralax no melena, no indigestion GU:no hematuria, no dysuria MS:no joint pain, no claudication, + chronic back pain Neuro:no syncope, no lightheadedness Endo:+ diabetes, no thyroid disease  Wt Readings from Last 3 Encounters:  08/25/15 171 lb (77.565 kg)  02/12/15 177 lb 9.6 oz (80.559 kg)  02/06/15 177 lb 9.6 oz (80.559 kg)     PHYSICAL EXAM: VS:  BP 128/58 mmHg  Pulse 72  Ht  (1.702 m)  Wt 171 lb (77.565 kg)  BMI 26.78 kg/m2  SpO2 98% , BMI Body mass index is 26.78 kg/(m^2). General:Pleasant affect, NAD Skin:Warm and dry, brisk capillary refill HEENT:normocephalic, sclera clear, mucus membranes moist Neck:supple, no JVD, + bruits possible murmur radiation  Heart:S1S2 RRR with 2-3/6 systolic murmur, no gallup, rub or click Lungs:clear without rales, rhonchi, or wheezes WUJ:WJXB, non tender, + BS, do not palpate liver spleen or masses Ext:no lower ext edema, 2+ pedal pulses, 2+ radial pulses Neuro:alert and oriented X 3, MAE, follows commands, + facial symmetry    EKG:  EKG is ordered today. The ekg ordered today demonstrates SR no acute changes.   Recent Labs: 02/06/2015: BUN 21; Creatinine, Ser 0.96; Hemoglobin 12.0*; Platelets 169; Potassium 4.4; Sodium 140    Lipid Panel No results found for: CHOL, TRIG, HDL, CHOLHDL, VLDL, LDLCALC, LDLDIRECT     Other studies Reviewed: Additional studies/ records that were reviewed today include:   ECHO: Study Conclusions  - Left ventricle: The cavity size was normal. Systolic function was normal. The estimated ejection fraction was in the range of 55% to 60%. Wall motion was normal; there were no regional wall motion abnormalities. There was an increased  relative contribution of atrial contraction to ventricular filling. Doppler parameters are consistent with abnormal left ventricular relaxation (grade 1 diastolic dysfunction). - Aortic valve: Moderate thickening and calcification, consistent with sclerosis. Valve mobility was moderately restricted. There was moderate stenosis. Mean gradient (S): 23 mm Hg. Peak gradient (S): 43 mm Hg. Valve area (VTI): 1.23 cm^2. Valve area (Vmax): 1.1 cm^2. Valve area (Vmean): 1.2 cm^2. - Mitral valve: Moderate mitral annular calcification of the posterior mitral valve annulus. Valve area by pressure half-time: 2.27 cm^2. Valve area by continuity equation (using LVOT flow): 2.31 cm^2. - Pulmonic valve: There was trivial regurgitation.   Cardiac cath 2005 1. Coronary angiography.  1. Left main coronary: The left main is widely patent. There is no  evidence of stenosis.  2. Left anterior descending coronary artery: The LAD is occluded in  the mid vessel beyond the first diagonal and the second septal  perforator. The first diagonal is also totally occluded. The  diagonals fill by saphenous vein graft and LAD fills distally by  LIMA graft.  3. Circumflex artery: Circumflex is basically occluded in the mid  vessel. No significant obtuse marginal branches seen to arise  distally.  4. Right coronary artery: The right coronary contains a 50-70% mid  vessel stenosis, 80-85% distal narrowing before the PDA.  Comparative flow was noted with the saphenous vein graft.  1. Bypass graft angiography.  1. Saphenous vein graft sequential to the first and second diagonals  widely patent.  2. Saphenous vein graft to the first and second obtuse marginals widely  patent.  3. Saphenous vein graft to the PDA widely patent.  1. LIMA to the LAD. Described as  widely patent to the mid to distal LAD.  CONCLUSIONS: 1. The patient has significant native vessel coronary disease with total  occlusion on  the mid circumflex, left to left collaterals to a small  obtuse marginal branch that is occluded. The LAD is totally occluded in  the mid vessel. First diagonal vessel is totally occluded and the  distal right coronary contains a 70-90% stenosis before the PDA. There  is also moderate mid RCA disease.  1. Normal left ventricular function.  1. Widely patent saphenous vein graft as outlined above.  1. Widely patent left internal mammary artery.   ASSESSMENT AND PLAN:  1.  Angina, rare episode but increasing in severity.  Will plan lexiscan myoview if + would need cath.  Unable to increase meds-per his home records his BP systolic i90s frequently.  Will notify Dr. [name] as well   2. CAD with hx CABG in 1997 with patent grafts at last cath 2005.  3. Hyperlipidemia, will ask for labs from PCP. On pravachol.  4. Hypotension stable today but at home his systolic reading in 90s he is asymptomatic  He will follow up with Dr. [name] n 2-3 weeks for results.     Current medicines are reviewed with the patient today.  The patient Has no concerns regarding medicines.  The following changes have been made:  See above Labs/ tests ordered today include:see above  Disposition:   FU:  see above  Signed, Leone Brand, NP  08/25/2015 1:38 PM    [facility] [address] [place], [place], [place]  [address]/ [address] [place], [place] Phone: [phone]; Fax: [phone]  [phone]

## 2015-08-26 ENCOUNTER — Telehealth (HOSPITAL_COMMUNITY): Payer: Self-pay | Admitting: *Deleted

## 2015-08-26 NOTE — Telephone Encounter (Signed)
Left message on voicemail in reference to upcoming appointment scheduled for 08/27/15. Phone number given for a call back so details instructions can be given. Antionette CharMary J Aceyn Kathol, RN

## 2015-08-27 ENCOUNTER — Ambulatory Visit (HOSPITAL_COMMUNITY): Payer: Medicare Other | Attending: Cardiology

## 2015-08-27 DIAGNOSIS — R079 Chest pain, unspecified: Secondary | ICD-10-CM | POA: Diagnosis not present

## 2015-08-27 DIAGNOSIS — E119 Type 2 diabetes mellitus without complications: Secondary | ICD-10-CM | POA: Insufficient documentation

## 2015-08-27 LAB — MYOCARDIAL PERFUSION IMAGING
CHL CUP NUCLEAR SDS: 2
CHL CUP NUCLEAR SRS: 1
CHL CUP RESTING HR STRESS: 66 {beats}/min
LV sys vol: 29 mL
LVDIAVOL: 84 mL
Peak HR: 96 {beats}/min
RATE: 0.29
SSS: 3
TID: 1.01

## 2015-08-27 MED ORDER — TECHNETIUM TC 99M SESTAMIBI GENERIC - CARDIOLITE
32.4000 | Freq: Once | INTRAVENOUS | Status: AC | PRN
  Administered 2015-08-27: 32 via INTRAVENOUS

## 2015-08-27 MED ORDER — REGADENOSON 0.4 MG/5ML IV SOLN
0.4000 mg | Freq: Once | INTRAVENOUS | Status: AC
  Administered 2015-08-27: 0.4 mg via INTRAVENOUS

## 2015-08-27 MED ORDER — TECHNETIUM TC 99M SESTAMIBI GENERIC - CARDIOLITE
11.0000 | Freq: Once | INTRAVENOUS | Status: AC | PRN
  Administered 2015-08-27: 11 via INTRAVENOUS

## 2015-09-01 ENCOUNTER — Telehealth: Payer: Self-pay

## 2015-09-01 NOTE — Telephone Encounter (Signed)
Pt aware of myoview results and Dr.Smith's recommendation. Sress test is low risk. Continue management with sublingual nitroglycerin as needed for chest pain. No further aggressive/invasive procedures at this time. Pt sts tha he would still like to see Dr.Smith, pt will keep his upcoming appt on 11/29

## 2015-09-01 NOTE — Telephone Encounter (Signed)
-----   Message from Lyn RecordsHenry W Smith, MD sent at 08/27/2015  5:54 PM EST ----- Distress test is low risk. Continue management with sublingual nitroglycerin as needed for chest pain. No further aggressive/invasive procedures at this time.

## 2015-09-14 NOTE — Progress Notes (Signed)
Cardiology Office Note   Date:  09/15/2015   ID:  Gregory Barry, DOB 04/25/1921, MRN [id number]  PCP:  Lillia MountainGRIFFIN,Gregory JOSEPH, MD  Cardiologist:  Gregory NoeSMITH III,HENRY W, MD   Chief Complaint  Patient presents with  . Chest Pain      History of Present Illness: Gregory Barry is a 81 y.o. male who presents for CAD, hyperlipidemia, previous bypass grafting, patent bypass grafts by cath 2005, low risk nuclear study 2016, low back discomfort, and hyperlipidemia  Gregory RuizJohn continues to slowly lose physical abilities. [facility] [employer] and does [facility] aerobics. He is concerned that his strength and endurance are decreasing. He was seen here several weeks ago because of 3 episodes of angina over a two-week time frame. One episode required multiple nitroglycerin to relieve. He then underwent evaluation that included a myocardial perfusion study. This study did not demonstrate any significant perfusion abnormality and was felt to be low risk. Since that time he has done well. He denies recurrent nitroglycerin use. He is slightly more dyspneic now than previously. There is no orthopnea or lower extremity swelling.    Past Medical History  Diagnosis Date  . CAD (coronary artery disease)   . Mixed hyperlipidemia   . Insomnia, unspecified   . Discoid lupus   . Allergic rhinitis   . BPH (benign prostatic hyperplasia)   . Constipated   . Hearing loss   . Amnesia, global, transient     Episode was very brief & not associated with other neurlogical findings.  . Overweight(278.02)   . Dizziness     With standing but could not document orthostasis today  . Polyneuropathy in diabetes(357.2)   . Coronary atherosclerosis     With sequential SVG to D#1 and #2, sequential SVG to OM #1 and #2, SVG to PDA, and LIMA to LAD. Patent grafts 2205 and no ischemia by Nuc 09/2009 [name]  . Triggering of finger     Left thumb and right fourth finger  . Periarthritis 2001    Left shoulder  . Fungal infection of  toenail   . Cataract   . Balance problem   . Essential hypertension, benign     followed by Dr. Mendel RyderH. Smith - last seen late March, 2016, he wishes for the pt. to have further testing prior to clearing for surgery   . Meralgia paresthetica 1997  . Type II diabetes mellitus (HCC)   . Pneumonia     "when I was a child"  . Stroke (HCC) 01/2011    R BG CVA on MRI, MRA occluded R vertebral artery  . Osteoarthrosis, unspecified whether generalized or localized, lower leg     knees, back    . Chronic lower back pain   . Calculus of kidney     Past Surgical History  Procedure Laterality Date  . Coronary artery bypass graft  1997    "CABG X5"  . Lumbar laminectomy  2006    "Dr. Yetta BarreJones"  . Back surgery    . Lesion removal      Benign lesions  . Tonsillectomy    . Total knee arthroplasty Left ~ 2005    [name]  . Total knee arthroplasty Right 07/29/11  . Coronary angioplasty with stent placement      With sequential SVG to D#1 and #2, sequential SVG to OM #1 and #2, SVG to PDA, and LIMA to LAD. Patent grafts 2205 and no ischemia by Nuc 09/2009 [name]  .  Lithotripsy      [name]  . Hemilaminotomy lumbar spine  02/12/2015    Decompressive lumbar hemilaminectomy, medial facetectomy, foraminotomies at L1-2 and L2-3 on the left followed by sublaminar decompression  . Inguinal hernia repair Right 1997  . Joint replacement    . Lumbar laminectomy/decompression microdiscectomy N/A 02/12/2015    Procedure: Laminectomy - Lumbar one -two lumbar two -three;  Surgeon: Tia Alert, MD;  Location: [facility];  Service: Neurosurgery;  Laterality: N/A;     Current Outpatient Prescriptions  Medication Sig Dispense Refill  . aspirin EC 81 MG tablet Take 162 mg by mouth daily.    . cholecalciferol (VITAMIN D) 1000 UNITS tablet Take 1,000 Units by mouth daily.    . fluocinonide cream (LIDEX) 0.05 % Apply 1 application topically as needed (contact dermatitis).    . FREESTYLE LITE test strip 1 strip  by Other route 4 (four) times daily. Use 1 strip to check glucose four times a day  0  . hydroxypropyl methylcellulose (ISOPTO TEARS) 2.5 % ophthalmic solution Place 1 drop into both eyes 4 (four) times daily.     . isosorbide mononitrate (IMDUR) 60 MG 24 hr tablet TAKE 1 TABLET BY MOUTH DAILY 90 tablet 1  . Melatonin 5 MG TABS Take 1 tablet by mouth at bedtime as needed (SLEEP).     . metFORMIN (GLUCOPHAGE) 500 MG tablet Take 500 mg by mouth 2 (two) times daily with a meal.    . metoprolol succinate (TOPROL-XL) 50 MG 24 hr tablet Take 50 mg by mouth daily after supper. Take with or immediately following a meal.    . mirabegron ER (MYRBETRIQ) 50 MG TB24 tablet Take 50 mg by mouth daily.    . nitroGLYCERIN (NITROSTAT) 0.4 MG SL tablet Place 0.4 mg under the tongue every 5 (five) minutes as needed for chest pain.    . Omega-3 Fatty Acids (FISH OIL) 1200 MG CAPS Take 1 capsule by mouth 2 (two) times daily.    . pioglitazone (ACTOS) 15 MG tablet Take 15 mg by mouth 2 (two) times daily.    . polyethylene glycol (MIRALAX / GLYCOLAX) packet Take 17 g by mouth at bedtime as needed for mild constipation.     . pravastatin (PRAVACHOL) 20 MG tablet Take 20 mg by mouth daily.    . tamsulosin (FLOMAX) 0.4 MG CAPS capsule Take 1 capsule 30 minutes after the same meal each day    . Ubiquinol 200 MG CAPS Take 200 mg by mouth daily.     No current facility-administered medications for this visit.    Allergies:   Simvastatin and Sporanos    Social History:  The patient  reports that he has quit smoking. His smoking use included Cigarettes. He has a 20 pack-year smoking history. He has never used smokeless tobacco. He reports that he drinks about 3.0 oz of alcohol per week. He reports that he does not use illicit drugs.   Family History:  The patient's family history includes Cancer in his maternal grandmother; Congestive Heart Failure in his father and mother; Down syndrome in his brother; Heart failure in his  father and other; Hypertension in his mother; Pneumonia in his brother; Stroke in his other; Thrombosis in his mother.    ROS:  Please see the history of present illness.   Otherwise, review of systems are positive for difficulty with ambulation due to his back and legs feeling weak.   All other systems are reviewed and negative.  PHYSICAL EXAM: VS:  BP 138/60 mmHg  Pulse 72  Ht  (1.702 m)  Wt 177 lb 3.2 oz (80.377 kg)  BMI 27.75 kg/m2  SpO2 99% , BMI Body mass index is 27.75 kg/(m^2). GEN: Well nourished, well developed, in no acute distress HEENT: normal Neck: no JVD, carotid bruits, or masses Cardiac: RRR.  There is no murmur, rub, or gallop. There is no edema. Respiratory:  clear to auscultation bilaterally, normal work of breathing. GI: soft, nontender, nondistended, + BS MS: no deformity or atrophy Skin: warm and dry, no rash Neuro:  Strength and sensation are intact Psych: euthymic mood, full affect   EKG:  EKG is not ordered today.    Recent Labs: 02/06/2015: BUN 21; Creatinine, Ser 0.96; Hemoglobin 12.0*; Platelets 169; Potassium 4.4; Sodium 140    Lipid Panel No results found for: CHOL, TRIG, HDL, CHOLHDL, VLDL, LDLCALC, LDLDIRECT    Wt Readings from Last 3 Encounters:  09/15/15 177 lb 3.2 oz (80.377 kg)  08/27/15 171 lb (77.565 kg)  08/25/15 171 lb (77.565 kg)      Other studies Reviewed: Additional studies/ records that were reviewed today include: Recent evaluation for chest pain. The findings include the workup and management included up titration of nitrate therapy, but hypotension prevented continuation of the higher dose. The nuclear study performed revealed the following:  Study Highlights     Nuclear stress EF: 65%.  There was no ST segment deviation noted during stress.  No T wave inversion was noted during stress.  The left ventricular ejection fraction is normal (55-65%).  The study is normal.  This is a low risk study.  Low  risk stress nuclear study with normal perfusion and normal left ventricular regional and global systolic function.      ASSESSMENT AND PLAN:  1. Coronary artery disease involving coronary bypass graft of native heart with unspecified angina pectoris Angina is currently stable. Suspect bypass graft failure. When a long discussion today in the presence of his wife. We decided against coronary angiography at this time. If angina recurs and certainly if the changes pattern and becomes more prevalent or prolonged, we've agreed to proceed with coronary angiography despite his advanced age.  2. Essential hypertension Quite a bit higher than what we have noted more recently and what he gets at home.  3. Aortic stenosis, moderate He had moderate aortic stenosis by echo in April. I don't believe that this is a significant issue with reference to current anginal complaints.  4. Hyperlipidemia On therapy  5. Chronic diastolic heart failure (HCC) Mild volume overload based on lower extremity edema. Neck veins are flat.    Current medicines are reviewed at length with the patient today.  The patient has the following concerns regarding medicines: None other than low blood pressure although pressure today if anything is mildly elevated..  The following changes/actions have been instituted:    Conservative approach given low risk myocardial perfusion study and resolution of angina. He's had no angina now in greater than 3 weeks.  Consider coronary angiography if angina recurs in any significant unstable way.  Consider adding Ranexa for antianginal effects since it won't lower his blood pressure  Caution to notify us if angina becomes recurrent.  Labs/ tests ordered today include:  No orders of the defined types were placed in this encounter.     Disposition:   FU with HS in 4 months  Signed, Gregory Noe, MD  09/15/2015 1:40 PM  [facility] [place], [place], [place]  [address] Phone: [phone]; Fax: [phone]

## 2015-09-15 ENCOUNTER — Ambulatory Visit (INDEPENDENT_AMBULATORY_CARE_PROVIDER_SITE_OTHER): Payer: Medicare Other | Admitting: Interventional Cardiology

## 2015-09-15 ENCOUNTER — Encounter: Payer: Self-pay | Admitting: Interventional Cardiology

## 2015-09-15 VITALS — BP 138/60 | HR 72 | Ht 67.0 in | Wt 177.2 lb

## 2015-09-15 DIAGNOSIS — E785 Hyperlipidemia, unspecified: Secondary | ICD-10-CM

## 2015-09-15 DIAGNOSIS — I10 Essential (primary) hypertension: Secondary | ICD-10-CM | POA: Diagnosis not present

## 2015-09-15 DIAGNOSIS — I350 Nonrheumatic aortic (valve) stenosis: Secondary | ICD-10-CM | POA: Diagnosis not present

## 2015-09-15 DIAGNOSIS — I25709 Atherosclerosis of coronary artery bypass graft(s), unspecified, with unspecified angina pectoris: Secondary | ICD-10-CM | POA: Diagnosis not present

## 2015-09-15 DIAGNOSIS — I5032 Chronic diastolic (congestive) heart failure: Secondary | ICD-10-CM

## 2015-09-15 NOTE — Patient Instructions (Signed)
Medication Instructions:  Your physician recommends that you continue on your current medications as directed. Please refer to the Current Medication list given to you today.   Labwork: None ordered  Testing/Procedures: None ordered  Follow-Up: Your physician recommends that you schedule a follow-up appointment in: 3-4 months   Any Other Special Instructions Will Be Listed Below (If Applicable).     If you need a refill on your cardiac medications before your next appointment, please call your pharmacy.   

## 2015-10-13 ENCOUNTER — Encounter: Payer: Self-pay | Admitting: Interventional Cardiology

## 2015-10-19 ENCOUNTER — Encounter: Payer: Self-pay | Admitting: Interventional Cardiology

## 2015-10-21 ENCOUNTER — Telehealth: Payer: Self-pay | Admitting: *Deleted

## 2015-10-21 MED ORDER — METOPROLOL SUCCINATE ER 25 MG PO TB24: 25.0000 mg | ORAL_TABLET | Freq: Every day | ORAL | Status: DC

## 2015-10-21 NOTE — Telephone Encounter (Signed)
Okay to write a prescription for metoprolol succinate 25 mg daily.  If no significant angina since the dose reduction instructions last week, ask him to completely stop isosorbide.  He should inform me if angina increases in intensity.

## 2015-10-21 NOTE — Telephone Encounter (Signed)
Left message with pt wife for pt to call back

## 2015-10-21 NOTE — Telephone Encounter (Signed)
Spoke with pt. Pt aware of Dr.Smith's response below. Pt sts that he has nor been having any episodes of Angina. Pt will d/c Isosorbide. Rx for Metoprolol 25mg  qd sent to pt pharmacy. Pt will call the office if any increase on angina. Pt agreeable with plan and verbalized understanding.

## 2015-11-26 DIAGNOSIS — W19XXXA Unspecified fall, initial encounter: Secondary | ICD-10-CM | POA: Diagnosis not present

## 2015-11-26 DIAGNOSIS — S0592XA Unspecified injury of left eye and orbit, initial encounter: Secondary | ICD-10-CM | POA: Diagnosis not present

## 2015-11-26 DIAGNOSIS — Z951 Presence of aortocoronary bypass graft: Secondary | ICD-10-CM | POA: Diagnosis not present

## 2015-11-26 DIAGNOSIS — N400 Enlarged prostate without lower urinary tract symptoms: Secondary | ICD-10-CM | POA: Diagnosis not present

## 2015-11-26 DIAGNOSIS — H1132 Conjunctival hemorrhage, left eye: Secondary | ICD-10-CM | POA: Diagnosis not present

## 2015-11-26 DIAGNOSIS — Z87891 Personal history of nicotine dependence: Secondary | ICD-10-CM | POA: Diagnosis not present

## 2015-11-26 DIAGNOSIS — S0502XA Injury of conjunctiva and corneal abrasion without foreign body, left eye, initial encounter: Secondary | ICD-10-CM | POA: Diagnosis not present

## 2015-11-26 DIAGNOSIS — Z87438 Personal history of other diseases of male genital organs: Secondary | ICD-10-CM | POA: Diagnosis not present

## 2015-11-26 DIAGNOSIS — H04129 Dry eye syndrome of unspecified lacrimal gland: Secondary | ICD-10-CM | POA: Diagnosis not present

## 2015-11-26 DIAGNOSIS — E1149 Type 2 diabetes mellitus with other diabetic neurological complication: Secondary | ICD-10-CM | POA: Diagnosis not present

## 2015-11-26 DIAGNOSIS — I10 Essential (primary) hypertension: Secondary | ICD-10-CM | POA: Diagnosis not present

## 2015-11-26 DIAGNOSIS — M1990 Unspecified osteoarthritis, unspecified site: Secondary | ICD-10-CM | POA: Diagnosis not present

## 2015-11-26 DIAGNOSIS — I2510 Atherosclerotic heart disease of native coronary artery without angina pectoris: Secondary | ICD-10-CM | POA: Diagnosis not present

## 2015-11-27 DIAGNOSIS — H52203 Unspecified astigmatism, bilateral: Secondary | ICD-10-CM | POA: Diagnosis not present

## 2015-11-27 DIAGNOSIS — H01001 Unspecified blepharitis right upper eyelid: Secondary | ICD-10-CM | POA: Diagnosis not present

## 2015-11-27 DIAGNOSIS — H02104 Unspecified ectropion of left upper eyelid: Secondary | ICD-10-CM | POA: Diagnosis not present

## 2015-11-27 DIAGNOSIS — H5213 Myopia, bilateral: Secondary | ICD-10-CM | POA: Diagnosis not present

## 2015-11-27 DIAGNOSIS — H01005 Unspecified blepharitis left lower eyelid: Secondary | ICD-10-CM | POA: Diagnosis not present

## 2015-11-27 DIAGNOSIS — H25813 Combined forms of age-related cataract, bilateral: Secondary | ICD-10-CM | POA: Diagnosis not present

## 2015-11-27 DIAGNOSIS — H01002 Unspecified blepharitis right lower eyelid: Secondary | ICD-10-CM | POA: Diagnosis not present

## 2015-11-27 DIAGNOSIS — H01004 Unspecified blepharitis left upper eyelid: Secondary | ICD-10-CM | POA: Diagnosis not present

## 2015-11-27 DIAGNOSIS — H1132 Conjunctival hemorrhage, left eye: Secondary | ICD-10-CM | POA: Diagnosis not present

## 2015-11-27 DIAGNOSIS — H04123 Dry eye syndrome of bilateral lacrimal glands: Secondary | ICD-10-CM | POA: Diagnosis not present

## 2015-12-03 DIAGNOSIS — N3941 Urge incontinence: Secondary | ICD-10-CM | POA: Diagnosis not present

## 2015-12-21 DIAGNOSIS — E119 Type 2 diabetes mellitus without complications: Secondary | ICD-10-CM | POA: Diagnosis not present

## 2015-12-22 DIAGNOSIS — N3941 Urge incontinence: Secondary | ICD-10-CM | POA: Diagnosis not present

## 2016-01-05 DIAGNOSIS — N3281 Overactive bladder: Secondary | ICD-10-CM | POA: Diagnosis not present

## 2016-01-05 DIAGNOSIS — N3941 Urge incontinence: Secondary | ICD-10-CM | POA: Diagnosis not present

## 2016-01-05 DIAGNOSIS — R3912 Poor urinary stream: Secondary | ICD-10-CM | POA: Diagnosis not present

## 2016-01-08 ENCOUNTER — Encounter: Payer: Self-pay | Admitting: Interventional Cardiology

## 2016-01-08 ENCOUNTER — Ambulatory Visit (INDEPENDENT_AMBULATORY_CARE_PROVIDER_SITE_OTHER): Payer: Medicare Other | Admitting: Interventional Cardiology

## 2016-01-08 VITALS — BP 122/64 | HR 68 | Ht 67.0 in | Wt 173.0 lb

## 2016-01-08 DIAGNOSIS — I350 Nonrheumatic aortic (valve) stenosis: Secondary | ICD-10-CM

## 2016-01-08 DIAGNOSIS — I10 Essential (primary) hypertension: Secondary | ICD-10-CM | POA: Diagnosis not present

## 2016-01-08 DIAGNOSIS — I5032 Chronic diastolic (congestive) heart failure: Secondary | ICD-10-CM

## 2016-01-08 DIAGNOSIS — I25709 Atherosclerosis of coronary artery bypass graft(s), unspecified, with unspecified angina pectoris: Secondary | ICD-10-CM

## 2016-01-08 DIAGNOSIS — I950 Idiopathic hypotension: Secondary | ICD-10-CM

## 2016-01-08 MED ORDER — METOPROLOL SUCCINATE ER 25 MG PO TB24: 12.5000 mg | ORAL_TABLET | Freq: Every day | ORAL | Status: DC

## 2016-01-08 NOTE — Progress Notes (Signed)
Cardiology Office Note   Date:  01/08/2016   ID:  Gregory Barry, DOB [birth year], MRN [id number]  PCP:  Gregory Mountain, MD  Cardiologist:  Gregory Noe, MD   Chief Complaint  Patient presents with  . Coronary Artery Disease      History of Present Illness: Gregory Barry is a 82 y.o. male who presents for Autonomic neuropathy with hypotension, coronary artery disease, previous history of hypertension, aortic stenosis, diastolic heart failure, diabetes mellitus with end-end organ involvement, increasing difficulty with imbalance and falls.  No angina despite significant reduction in antianginal coverage. Nitrates have been discontinued. Despite this, he has had falls related to balance. His been diagnosed with peripheral neuropathy. He has not needed to use nitroglycerin. He denies dyspnea and lower extremity swelling.   Past Medical History  Diagnosis Date  . CAD (coronary artery disease)   . Mixed hyperlipidemia   . Insomnia, unspecified   . Discoid lupus   . Allergic rhinitis   . BPH (benign prostatic hyperplasia)   . Constipated   . Hearing loss   . Amnesia, global, transient     Episode was very brief & not associated with other neurlogical findings.  . Overweight(278.02)   . Dizziness     With standing but could not document orthostasis today  . Polyneuropathy in diabetes(357.2)   . Coronary atherosclerosis     With sequential SVG to D#1 and #2, sequential SVG to OM #1 and #2, SVG to PDA, and LIMA to LAD. Patent grafts 2205 and no ischemia by Nuc 09/2009 Gregory Barry)  . Triggering of finger     Left thumb and right fourth finger  . Periarthritis 2001    Left shoulder  . Fungal infection of toenail   . Cataract   . Balance problem   . Essential hypertension, benign     followed by Dr. Mendel Barry - last seen late March, 2016, he wishes for the pt. to have further testing prior to clearing for surgery   . Meralgia paresthetica 1997  . Type II diabetes mellitus  (HCC)   . Pneumonia     "when I was a child"  . Stroke (HCC) 01/2011    R BG CVA on MRI, MRA occluded R vertebral artery  . Osteoarthrosis, unspecified whether generalized or localized, lower leg     knees, back    . Chronic lower back pain   . Calculus of kidney     Past Surgical History  Procedure Laterality Date  . Coronary artery bypass graft  1997    "CABG X5"  . Lumbar laminectomy  2006    "Dr. Yetta Barry"  . Back surgery    . Lesion removal      Benign lesions  . Tonsillectomy    . Total knee arthroplasty Left ~ 2005    Gregory Barry  . Total knee arthroplasty Right 07/29/11  . Coronary angioplasty with stent placement      With sequential SVG to D#1 and #2, sequential SVG to OM #1 and #2, SVG to PDA, and LIMA to LAD. Patent grafts 2205 and no ischemia by Nuc 09/2009 Gregory Barry)  . Lithotripsy      Gregory Barry  . Hemilaminotomy lumbar spine  02/12/2015    Decompressive lumbar hemilaminectomy, medial facetectomy, foraminotomies at L1-2 and L2-3 on the left followed by sublaminar decompression  . Inguinal hernia repair Right 1997  . Joint replacement    . Lumbar laminectomy/decompression microdiscectomy N/A 02/12/2015    Procedure:  Laminectomy - Lumbar one -two lumbar two -three;  Surgeon: Gregory Alert, MD;  Location: [facility];  Service: Neurosurgery;  Laterality: N/A;     Current Outpatient Prescriptions  Medication Sig Dispense Refill  . aspirin EC 81 MG tablet Take 162 mg by mouth daily.    . cholecalciferol (VITAMIN D) 1000 UNITS tablet Take 1,000 Units by mouth daily.    . fluocinonide cream (LIDEX) 0.05 % Apply 1 application topically as needed (contact dermatitis).    . FREESTYLE LITE test strip 1 strip by Other route 4 (four) times daily. Use 1 strip to check glucose four times a day  0  . hydroxypropyl methylcellulose (ISOPTO TEARS) 2.5 % ophthalmic solution Place 1 drop into both eyes 4 (four) times daily.     . metFORMIN (GLUCOPHAGE) 500 MG tablet Take 500 mg by mouth 2  (two) times daily with a meal.    . metoprolol succinate (TOPROL-XL) 25 MG 24 hr tablet Take 1 tablet (25 mg total) by mouth daily after supper. Take with or immediately following a meal. 30 tablet 11  . mirabegron ER (MYRBETRIQ) 50 MG TB24 tablet Take 50 mg by mouth daily.    . nitroGLYCERIN (NITROSTAT) 0.4 MG SL tablet Place 0.4 mg under the tongue every 5 (five) minutes as needed for chest pain.    . Omega-3 Fatty Acids (FISH OIL) 1200 MG CAPS Take 1 capsule by mouth 2 (two) times daily.    . pioglitazone (ACTOS) 15 MG tablet Take 15 mg by mouth 2 (two) times daily.    . polyethylene glycol (MIRALAX / GLYCOLAX) packet Take 17 g by mouth at bedtime as needed for mild constipation.     . pravastatin (PRAVACHOL) 20 MG tablet Take 20 mg by mouth daily.    . tamsulosin (FLOMAX) 0.4 MG CAPS capsule Take 1 capsule 30 minutes after the same meal each day    . Ubiquinol 200 MG CAPS Take 200 mg by mouth daily.     No current facility-administered medications for this visit.    Allergies:   Simvastatin and Sporanos    Social History:  The patient  reports that he has quit smoking. His smoking use included Cigarettes. He has a 20 pack-year smoking history. He has never used smokeless tobacco. He reports that he drinks about 3.0 oz of alcohol per week. He reports that he does not use illicit drugs.   Family History:  The patient's family history includes Cancer in his maternal grandmother; Congestive Heart Failure in his father and mother; Down syndrome in his brother; Heart failure in his father and other; Hypertension in his mother; Pneumonia in his brother; Stroke in his other; Thrombosis in his mother.    ROS:  Please see the history of present illness.   Otherwise, review of systems are positive for . Difficulty urinating, dizziness, backache, shortness of breath, hearing loss, leg pain, and excessive fatigue.    All other systems are reviewed and negative.    PHYSICAL EXAM: VS:  BP 122/64 mmHg   Pulse 68  Ht  (1.702 m)  Wt 173 lb (78.472 kg)  BMI 27.09 kg/m2  SpO2 98% , BMI Body mass index is 27.09 kg/(m^2). GEN: Well nourished, well developed, in no acute distress HEENT: normal Neck: no JVD, carotid bruits, or masses Cardiac: RRR.  There is 3/6 systolic murmur but no rub, or gallop. There is absent edema. Respiratory:  clear to auscultation bilaterally, normal work of breathing. GI: soft, nontender,  nondistended, + BS MS: no deformity or atrophy Skin: warm and dry, no rash Neuro:  Strength and sensation are intact Psych: euthymic mood, full affect   EKG:  EKG not performed ordered today.   Recent Labs: 02/06/2015: BUN 21; Creatinine, Ser 0.96; Hemoglobin 12.0*; Platelets 169; Potassium 4.4; Sodium 140    Lipid Panel No results found for: CHOL, TRIG, HDL, CHOLHDL, VLDL, LDLCALC, LDLDIRECT    Wt Readings from Last 3 Encounters:  01/08/16 173 lb (78.472 kg)  09/15/15 177 lb 3.2 oz (80.377 kg)  08/27/15 171 lb (77.565 kg)      Other studies Reviewed: Additional studies/ records that were reviewed today include: None. The findings include none. I did review a blood pressure long that he brought from home. There are rare blood pressures that are greater than 105 mmHg. Relatively frequent blood pressures in the 90s.    ASSESSMENT AND PLAN:  1. Coronary artery disease involving coronary bypass graft of native heart with unspecified angina pectoris No angina despite significant reduction in antianginal coverage  2. Essential hypertension Blood pressure has gone from being elevated to relatively low despite therapy  3. Aortic stenosis, moderate Moderate aortic stenosis, not enough to cause the current difficulty with low pressure  4. Chronic diastolic heart failure (HCC) No dyspnea or evidence of volume overload.  5. Idiopathic hypotension Blood pressure is still relatively low off isosorbide. No angina off isosorbide. This is likely related to autonomic  neuropathy from diabetes    Current medicines are reviewed at length with the patient today.  The patient has the following concerns regarding medicines: None except he wants to be off medications or could be impacting his blood pressure.  The following changes/actions have been instituted:    Decrease metoprolol succinate to 12.5 mg for 7-14 days then discontinue  Moderate tension knee-high support stockings  Liberal salt in diet  Call of dyspnea or chest pain  Labs/ tests ordered today include:  No orders of the defined types were placed in this encounter.     Disposition:   FU with HS in 9 years  Signed, Gregory NoeSMITH III,Whitten Andreoni W, MD  01/08/2016 10:40 AM    [facility] [address], [place], [place]  [address] Phone: [phone]; Fax: [phone]

## 2016-01-08 NOTE — Patient Instructions (Addendum)
Medication Instructions:   START TAKING METOPROLOL  1/2 TABLET OF 25MG  FOR 10 to 14  DAYS THEN STOP  If you need a refill on your cardiac medications before your next appointment, please call your pharmacy.  Labwork: NONE ORDER TODAY    Testing/Procedures: NONE ORDER TODAY   Follow-Up: IN 9 MONTHS WITH DR Katrinka BlazingSMITH    Any Other Special Instructions Will Be Listed Below (If Applicable).  CONTACT OUR OFFICE BACK IF EXPERIENCING ANY CHEST PAINS OR SHORTNESS OF BREATH   YOU HAVE BEEN RECCOMMENDED TO WEAR  TED HOSE SUPPORT STOCKINGS FOR YOUR HYPOTENSION

## 2016-01-12 DIAGNOSIS — N3281 Overactive bladder: Secondary | ICD-10-CM | POA: Diagnosis not present

## 2016-01-12 DIAGNOSIS — N320 Bladder-neck obstruction: Secondary | ICD-10-CM | POA: Diagnosis not present

## 2016-01-12 DIAGNOSIS — N3941 Urge incontinence: Secondary | ICD-10-CM | POA: Diagnosis not present

## 2016-01-19 ENCOUNTER — Telehealth: Payer: Self-pay | Admitting: *Deleted

## 2016-01-19 NOTE — Telephone Encounter (Signed)
SONIA HAMLIN PA  FROM [facility]  CALLED. PT  IS  SCHEDULED FOR  EYE  SURGERY   02-04-16 AND THEY  NEED  PT TO HOLD  ASA  FOR  1  WEEK .WILL FORWARD TO  DR  Katrinka BlazingSMITH  FOR  REVIEW . RESPONSE  TO  BE  FAXED  TO  [phone] ./CY

## 2016-01-20 NOTE — Telephone Encounter (Signed)
Okay to hold aspirin for one week in preparation for ophthalmologic surgery.

## 2016-01-21 ENCOUNTER — Encounter: Payer: Self-pay | Admitting: Interventional Cardiology

## 2016-01-21 NOTE — Telephone Encounter (Signed)
Clearance placed in MR nurse fax box [facility] attn: Allayne GitelmanSonia Hamlin,PA fax # [phone]

## 2016-01-26 ENCOUNTER — Encounter: Payer: Self-pay | Admitting: Interventional Cardiology

## 2016-01-27 MED ORDER — METOPROLOL SUCCINATE ER 25 MG PO TB24: 25.0000 mg | ORAL_TABLET | Freq: Every day | ORAL | Status: DC

## 2016-02-02 DIAGNOSIS — N3941 Urge incontinence: Secondary | ICD-10-CM | POA: Diagnosis not present

## 2016-02-04 DIAGNOSIS — E119 Type 2 diabetes mellitus without complications: Secondary | ICD-10-CM | POA: Diagnosis not present

## 2016-02-04 DIAGNOSIS — Z951 Presence of aortocoronary bypass graft: Secondary | ICD-10-CM | POA: Diagnosis not present

## 2016-02-04 DIAGNOSIS — H524 Presbyopia: Secondary | ICD-10-CM | POA: Diagnosis not present

## 2016-02-04 DIAGNOSIS — H5210 Myopia, unspecified eye: Secondary | ICD-10-CM | POA: Diagnosis not present

## 2016-02-04 DIAGNOSIS — H52209 Unspecified astigmatism, unspecified eye: Secondary | ICD-10-CM | POA: Diagnosis not present

## 2016-02-04 DIAGNOSIS — I2510 Atherosclerotic heart disease of native coronary artery without angina pectoris: Secondary | ICD-10-CM | POA: Diagnosis not present

## 2016-02-04 DIAGNOSIS — I771 Stricture of artery: Secondary | ICD-10-CM | POA: Diagnosis not present

## 2016-02-04 DIAGNOSIS — I10 Essential (primary) hypertension: Secondary | ICD-10-CM | POA: Diagnosis not present

## 2016-02-04 DIAGNOSIS — H04223 Epiphora due to insufficient drainage, bilateral lacrimal glands: Secondary | ICD-10-CM | POA: Diagnosis not present

## 2016-02-04 DIAGNOSIS — H40053 Ocular hypertension, bilateral: Secondary | ICD-10-CM | POA: Diagnosis not present

## 2016-02-04 DIAGNOSIS — H0289 Other specified disorders of eyelid: Secondary | ICD-10-CM | POA: Diagnosis not present

## 2016-02-04 DIAGNOSIS — H04203 Unspecified epiphora, bilateral lacrimal glands: Secondary | ICD-10-CM | POA: Diagnosis not present

## 2016-02-04 DIAGNOSIS — H04563 Stenosis of bilateral lacrimal punctum: Secondary | ICD-10-CM | POA: Diagnosis not present

## 2016-02-19 DIAGNOSIS — Z87891 Personal history of nicotine dependence: Secondary | ICD-10-CM | POA: Diagnosis not present

## 2016-02-19 DIAGNOSIS — H02832 Dermatochalasis of right lower eyelid: Secondary | ICD-10-CM | POA: Diagnosis not present

## 2016-02-19 DIAGNOSIS — H04223 Epiphora due to insufficient drainage, bilateral lacrimal glands: Secondary | ICD-10-CM | POA: Diagnosis not present

## 2016-02-19 DIAGNOSIS — Z7982 Long term (current) use of aspirin: Secondary | ICD-10-CM | POA: Diagnosis not present

## 2016-02-19 DIAGNOSIS — H02835 Dermatochalasis of left lower eyelid: Secondary | ICD-10-CM | POA: Diagnosis not present

## 2016-02-19 DIAGNOSIS — H02831 Dermatochalasis of right upper eyelid: Secondary | ICD-10-CM | POA: Diagnosis not present

## 2016-02-19 DIAGNOSIS — H02834 Dermatochalasis of left upper eyelid: Secondary | ICD-10-CM | POA: Diagnosis not present

## 2016-02-19 DIAGNOSIS — H11823 Conjunctivochalasis, bilateral: Secondary | ICD-10-CM | POA: Diagnosis not present

## 2016-02-19 DIAGNOSIS — Z9889 Other specified postprocedural states: Secondary | ICD-10-CM | POA: Diagnosis not present

## 2016-02-19 DIAGNOSIS — Z7984 Long term (current) use of oral hypoglycemic drugs: Secondary | ICD-10-CM | POA: Diagnosis not present

## 2016-02-23 DIAGNOSIS — N3941 Urge incontinence: Secondary | ICD-10-CM | POA: Diagnosis not present

## 2016-03-24 DIAGNOSIS — N3941 Urge incontinence: Secondary | ICD-10-CM | POA: Diagnosis not present

## 2016-03-25 DIAGNOSIS — H02834 Dermatochalasis of left upper eyelid: Secondary | ICD-10-CM | POA: Diagnosis not present

## 2016-03-25 DIAGNOSIS — E119 Type 2 diabetes mellitus without complications: Secondary | ICD-10-CM | POA: Diagnosis not present

## 2016-03-25 DIAGNOSIS — H11821 Conjunctivochalasis, right eye: Secondary | ICD-10-CM | POA: Diagnosis not present

## 2016-03-25 DIAGNOSIS — H04223 Epiphora due to insufficient drainage, bilateral lacrimal glands: Secondary | ICD-10-CM | POA: Diagnosis not present

## 2016-03-25 DIAGNOSIS — H02831 Dermatochalasis of right upper eyelid: Secondary | ICD-10-CM | POA: Diagnosis not present

## 2016-04-14 DIAGNOSIS — N3941 Urge incontinence: Secondary | ICD-10-CM | POA: Diagnosis not present

## 2016-04-21 DIAGNOSIS — H524 Presbyopia: Secondary | ICD-10-CM | POA: Diagnosis not present

## 2016-04-21 DIAGNOSIS — H02831 Dermatochalasis of right upper eyelid: Secondary | ICD-10-CM | POA: Diagnosis not present

## 2016-04-21 DIAGNOSIS — H43811 Vitreous degeneration, right eye: Secondary | ICD-10-CM | POA: Diagnosis not present

## 2016-04-21 DIAGNOSIS — H52203 Unspecified astigmatism, bilateral: Secondary | ICD-10-CM | POA: Diagnosis not present

## 2016-04-21 DIAGNOSIS — E1136 Type 2 diabetes mellitus with diabetic cataract: Secondary | ICD-10-CM | POA: Diagnosis not present

## 2016-04-21 DIAGNOSIS — H11823 Conjunctivochalasis, bilateral: Secondary | ICD-10-CM | POA: Diagnosis not present

## 2016-04-21 DIAGNOSIS — H40053 Ocular hypertension, bilateral: Secondary | ICD-10-CM | POA: Diagnosis not present

## 2016-04-21 DIAGNOSIS — H25813 Combined forms of age-related cataract, bilateral: Secondary | ICD-10-CM | POA: Diagnosis not present

## 2016-04-21 DIAGNOSIS — H5213 Myopia, bilateral: Secondary | ICD-10-CM | POA: Diagnosis not present

## 2016-04-21 DIAGNOSIS — H04123 Dry eye syndrome of bilateral lacrimal glands: Secondary | ICD-10-CM | POA: Diagnosis not present

## 2016-04-21 DIAGNOSIS — H02834 Dermatochalasis of left upper eyelid: Secondary | ICD-10-CM | POA: Diagnosis not present

## 2016-04-27 DIAGNOSIS — R351 Nocturia: Secondary | ICD-10-CM | POA: Diagnosis not present

## 2016-04-27 DIAGNOSIS — R972 Elevated prostate specific antigen [PSA]: Secondary | ICD-10-CM | POA: Diagnosis not present

## 2016-04-27 DIAGNOSIS — N320 Bladder-neck obstruction: Secondary | ICD-10-CM | POA: Diagnosis not present

## 2016-05-01 DIAGNOSIS — R9431 Abnormal electrocardiogram [ECG] [EKG]: Secondary | ICD-10-CM | POA: Diagnosis not present

## 2016-05-01 DIAGNOSIS — R079 Chest pain, unspecified: Secondary | ICD-10-CM | POA: Diagnosis not present

## 2016-05-01 DIAGNOSIS — R0789 Other chest pain: Secondary | ICD-10-CM | POA: Diagnosis not present

## 2016-05-01 DIAGNOSIS — Z951 Presence of aortocoronary bypass graft: Secondary | ICD-10-CM | POA: Diagnosis not present

## 2016-05-01 DIAGNOSIS — E119 Type 2 diabetes mellitus without complications: Secondary | ICD-10-CM | POA: Diagnosis not present

## 2016-05-01 DIAGNOSIS — R0602 Shortness of breath: Secondary | ICD-10-CM | POA: Diagnosis not present

## 2016-05-01 DIAGNOSIS — J439 Emphysema, unspecified: Secondary | ICD-10-CM | POA: Diagnosis not present

## 2016-05-01 DIAGNOSIS — Z883 Allergy status to other anti-infective agents status: Secondary | ICD-10-CM | POA: Diagnosis not present

## 2016-05-01 DIAGNOSIS — Z7984 Long term (current) use of oral hypoglycemic drugs: Secondary | ICD-10-CM | POA: Diagnosis not present

## 2016-05-01 DIAGNOSIS — I440 Atrioventricular block, first degree: Secondary | ICD-10-CM | POA: Diagnosis not present

## 2016-05-01 DIAGNOSIS — Z7982 Long term (current) use of aspirin: Secondary | ICD-10-CM | POA: Diagnosis not present

## 2016-05-01 DIAGNOSIS — I10 Essential (primary) hypertension: Secondary | ICD-10-CM | POA: Diagnosis not present

## 2016-05-03 DIAGNOSIS — Z7984 Long term (current) use of oral hypoglycemic drugs: Secondary | ICD-10-CM | POA: Diagnosis not present

## 2016-05-03 DIAGNOSIS — E1142 Type 2 diabetes mellitus with diabetic polyneuropathy: Secondary | ICD-10-CM | POA: Diagnosis not present

## 2016-05-03 DIAGNOSIS — R002 Palpitations: Secondary | ICD-10-CM | POA: Diagnosis not present

## 2016-05-03 DIAGNOSIS — Z5181 Encounter for therapeutic drug level monitoring: Secondary | ICD-10-CM | POA: Diagnosis not present

## 2016-05-03 DIAGNOSIS — I10 Essential (primary) hypertension: Secondary | ICD-10-CM | POA: Diagnosis not present

## 2016-05-03 DIAGNOSIS — Z79899 Other long term (current) drug therapy: Secondary | ICD-10-CM | POA: Diagnosis not present

## 2016-05-03 DIAGNOSIS — I952 Hypotension due to drugs: Secondary | ICD-10-CM | POA: Diagnosis not present

## 2016-05-03 DIAGNOSIS — I25811 Atherosclerosis of native coronary artery of transplanted heart without angina pectoris: Secondary | ICD-10-CM | POA: Diagnosis not present

## 2016-05-05 DIAGNOSIS — D510 Vitamin B12 deficiency anemia due to intrinsic factor deficiency: Secondary | ICD-10-CM | POA: Diagnosis not present

## 2016-05-05 DIAGNOSIS — N3941 Urge incontinence: Secondary | ICD-10-CM | POA: Diagnosis not present

## 2016-05-12 DIAGNOSIS — D649 Anemia, unspecified: Secondary | ICD-10-CM | POA: Diagnosis not present

## 2016-05-16 NOTE — Progress Notes (Signed)
Cardiology Office Note    Date:  05/18/2016   ID:  Gregory Barry, DOB [birth year], MRN [id number]  PCP:  Lillia Mountain, MD  Cardiologist:  Dr. [name]   CC: chest pain, palpitations   History of Present Illness:  Gregory Barry is a 82 y.o. male with a history of previous HTN, autonomic neuropathy with hypotension, CAD s/p CABG x5 (1997), moderate aortic stenosis, chronic diastolic heart failure, diabetes mellitus with end-end organ involvement, increasing difficulty with imbalance and falls who presents to clinic for evaluation of chest pain, palpitations and hypotension.   Last cath done in 2005 for angina which showed significant native vessel CAD wtih widely patent bypass grafts. Per Dr. [name], "the patient may be having angina perhaps from the small obtuse marginal supplied by collaterals. There is certainly no evidence of bypass graft occlusive disease.  Have had no sizable regions of myocardiumat risk. " Last nuclear stress test in 08/2015 was low risk. He was told to continue using SL NTG for chest pain.   He was last seen by Dr. [name] in 12/2015 for follow up. He was still having falls related to balance. BP was still low despite being taken off nitrates. He had him taper off his Toprol XL 25mg  after this visit. Idiopathic hypotension was felt to be 2/2 autonomic dysfunction for longstanding diabetes.  Review of email corespondances reveal that he had recurrent of angina after discontinued Toprol XL and this was restarted at 25mg  daily. His BP was still low despite stopping the medication but HR elevated into low 100s.  In 01/2016 he held ASA for opthalmologic surgery.  04/30/16 he was seen at [facility] for chest pain. His BP was 80/44 HR 105. He ruled out for MI with negative troponin x2 and normal ECG. He was discharged home.   Since that time no chest pain but has some palpitations. He does also get some chest pressure from time to time, but not necessity related to exertion. He does  not do anything in the way of exertion. He mostly just sits and reads. Occasionally goes to the drug store or book store. Walking a lot aggravates his back. He has not had as much dizziness or falling recently off the imdur. His BP has been stable soft ranging from SBP 98-138, but average in low 100s. Also having some leg cramping. No LE edema, orthopnea or PND.     Past Medical History:  Diagnosis Date  . Allergic rhinitis   . Amnesia, global, transient    Episode was very brief & not associated with other neurlogical findings.  . Balance problem   . BPH (benign prostatic hyperplasia)   . CAD (coronary artery disease)   . Calculus of kidney   . Cataract   . Chronic lower back pain   . Constipated   . Coronary atherosclerosis    With sequential SVG to D#1 and #2, sequential SVG to OM #1 and #2, SVG to PDA, and LIMA to LAD. Patent grafts 2205 and no ischemia by Nuc 09/2009 [name])  . Discoid lupus   . Dizziness    With standing but could not document orthostasis today  . Essential hypertension, benign    followed by Dr. Mendel Ryder - last seen late March, 2016, he wishes for the pt. to have further testing prior to clearing for surgery   . Fungal infection of toenail   . Hearing loss   . Insomnia, unspecified   . Meralgia paresthetica 1997  .  Mixed hyperlipidemia   . Osteoarthrosis, unspecified whether generalized or localized, lower leg    knees, back    . Overweight(278.02)   . Periarthritis 2001   Left shoulder  . Pneumonia    "when I was a child"  . Polyneuropathy in diabetes(357.2)   . Stroke (HCC) 01/2011   R BG CVA on MRI, MRA occluded R vertebral artery  . Triggering of finger    Left thumb and right fourth finger  . Type II diabetes mellitus (HCC)     Past Surgical History:  Procedure Laterality Date  . BACK SURGERY    . CORONARY ANGIOPLASTY WITH STENT PLACEMENT     With sequential SVG to D#1 and #2, sequential SVG to OM #1 and #2, SVG to PDA, and LIMA to LAD.  Patent grafts 2205 and no ischemia by Nuc 09/2009 [name])  . CORONARY ARTERY BYPASS GRAFT  1997   "CABG X5"  . HEMILAMINOTOMY LUMBAR SPINE  02/12/2015   Decompressive lumbar hemilaminectomy, medial facetectomy, foraminotomies at L1-2 and L2-3 on the left followed by sublaminar decompression  . INGUINAL HERNIA REPAIR Right 1997  . JOINT REPLACEMENT    . LESION REMOVAL     Benign lesions  . LITHOTRIPSY     [name]  . LUMBAR LAMINECTOMY  2006   "[name]"  . LUMBAR LAMINECTOMY/DECOMPRESSION MICRODISCECTOMY N/A 02/12/2015   Procedure: Laminectomy - Lumbar one -two lumbar two -three;  Surgeon: Tia Alert, MD;  Location: [facility];  Service: Neurosurgery;  Laterality: N/A;  . TONSILLECTOMY    . TOTAL KNEE ARTHROPLASTY Left ~ 2005   [name]  . TOTAL KNEE ARTHROPLASTY Right 07/29/11    Current Medications: Outpatient Medications Prior to Visit  Medication Sig Dispense Refill  . aspirin EC 81 MG tablet Take 162 mg by mouth daily.    . cholecalciferol (VITAMIN D) 1000 UNITS tablet Take 1,000 Units by mouth daily.    . fluocinonide cream (LIDEX) 0.05 % Apply 1 application topically as needed (contact dermatitis).    . FREESTYLE LITE test strip 1 strip by Other route 4 (four) times daily. Use 1 strip to check glucose four times a day  0  . hydroxypropyl methylcellulose (ISOPTO TEARS) 2.5 % ophthalmic solution Place 1 drop into both eyes 4 (four) times daily.     . metFORMIN (GLUCOPHAGE) 500 MG tablet Take 500 mg by mouth 2 (two) times daily with a meal.    . metoprolol succinate (TOPROL XL) 25 MG 24 hr tablet Take 1 tablet (25 mg total) by mouth daily. 90 tablet 3  . mirabegron ER (MYRBETRIQ) 50 MG TB24 tablet Take 50 mg by mouth daily.    . Omega-3 Fatty Acids (FISH OIL) 1200 MG CAPS Take 1 capsule by mouth 2 (two) times daily.    . pioglitazone (ACTOS) 15 MG tablet Take 15 mg by mouth 2 (two) times daily.    . polyethylene glycol (MIRALAX / GLYCOLAX) packet Take 17 g by mouth at  bedtime as needed for mild constipation.     . pravastatin (PRAVACHOL) 20 MG tablet Take 20 mg by mouth daily.    . tamsulosin (FLOMAX) 0.4 MG CAPS capsule Take 1 capsule 30 minutes after the same meal each day    . nitroGLYCERIN (NITROSTAT) 0.4 MG SL tablet Place 0.4 mg under the tongue every 5 (five) minutes as needed for chest pain.    Marland Kitchen Ubiquinol 200 MG CAPS Take 200 mg by mouth daily.     No facility-administered  medications prior to visit.      Allergies:   Simvastatin and Sporanos [itraconazole]   Social History   Social History  . Marital status: Married    Spouse name: N/A  . Number of children: N/A  . Years of education: N/A   Social History Main Topics  . Smoking status: Former Smoker    Packs/day: 1.00    Years: 20.00    Types: Cigarettes  . Smokeless tobacco: Never Used     Comment: "quit smoking cigarettes in 1964"  . Alcohol use 3.0 oz/week    5 Shots of liquor per week     Comment: 02/12/2015 "one, 1 shot cocktail before dinner 5 nights/week"  . Drug use: No  . Sexual activity: No   Other Topics Concern  . None   Social History Narrative  . None     Family History:  The patient's family history includes Cancer in his maternal grandmother; Congestive Heart Failure in his father and mother; Down syndrome in his brother; Heart failure in his father and other; Hypertension in his mother; Pneumonia in his brother; Stroke in his mother and other; Thrombosis in his mother.     ROS:   Please see the history of present illness.    ROS All other systems reviewed and are negative.   PHYSICAL EXAM:   VS:  BP (!) 160/78   Pulse 77   Ht 5\' 7"  (1.702 m)   Wt 169 lb 12.8 oz (77 kg)   BMI 26.59 kg/m    GEN: Well nourished, well developed, in no acute distress  HEENT: normal  Neck: no JVD, carotid bruits, or masses Cardiac: RRR; + SEM at RUSB, rubs, or gallops,no edema  Respiratory:  clear to auscultation bilaterally, normal work of breathing GI: soft,  nontender, nondistended, + BS MS: no deformity or atrophy  Skin: warm and dry, no rash Neuro:  Alert and Oriented x 3, Strength and sensation are intact Psych: euthymic mood, full affect  Wt Readings from Last 3 Encounters:  05/18/16 169 lb 12.8 oz (77 kg)  01/08/16 173 lb (78.5 kg)  09/15/15 177 lb 3.2 oz (80.4 kg)      Studies/Labs Reviewed:   EKG:  EKG is ordered today.  The ekg ordered today demonstrates NSR HR 77  Recent Labs: No results found for requested labs within last 8760 hours.   Lipid Panel No results found for: CHOL, TRIG, HDL, CHOLHDL, VLDL, LDLCALC, LDLDIRECT  Additional studies/ records that were reviewed today include:  2D ECHO: 02/09/2015 LV EF: 55% -   60% Study Conclusions - Left ventricle: The cavity size was normal. Systolic function was   normal. The estimated ejection fraction was in the range of 55%   to 60%. Wall motion was normal; there were no regional wall   motion abnormalities. There was an increased relative   contribution of atrial contraction to ventricular filling.   Doppler parameters are consistent with abnormal left ventricular   relaxation (grade 1 diastolic dysfunction). - Aortic valve: Moderate thickening and calcification, consistent   with sclerosis. Valve mobility was moderately restricted. There   was moderate stenosis. Mean gradient (S): 23 mm Hg. Peak gradient   (S): 43 mm Hg. Valve area (VTI): 1.23 cm^2. Valve area (Vmax):   1.1 cm^2. Valve area (Vmean): 1.2 cm^2. - Mitral valve: Moderate mitral annular calcification of the   posterior mitral valve annulus. Valve area by pressure half-time:   2.27 cm^2. Valve area by continuity equation (using  LVOT flow):   2.31 cm^2. - Pulmonic valve: There was trivial regurgitation   Nuclear stress test 08/2015  Nuclear stress EF: 65%.  There was no ST segment deviation noted during stress.  No T wave inversion was noted during stress.  The left ventricular ejection fraction is  normal (55-65%).  The study is normal.  This is a low risk study.   Low risk stress nuclear study with normal perfusion and normal left ventricular regional and global systolic function.       ASSESSMENT & PLAN:   CAD s/p CABG with angina: challenging situation with symptomatic hypotension and recurrent angina off imdur. Now doing okay on Toprol XL  daily. Recently seen at [facility] for one episode of angina and ruled out for MI.  Reassuring stress test 08/2015. Will continue to monitor for now.   Essential hypertension/ now with hypotension: has had ongoing issues with symptomatic hypotension likely 2/2 autonomic dysfunction. This has improved off imdur. Interestingly, BP high on initial reading today. I personally took BP which was 138/60. Review of BP log shows normal soft BPs. Continue Toprol XL  daily for now  Aortic stenosis, moderate: by 2D ECHO 01/2015. Will update 2D ECHO to make sure AS is not contributing to symptoms of chest pain, although doubt this.   Chronic diastolic heart failure (HCC): No dyspnea or evidence of volume overload.  Palpitations: will place 30 day event monitors.   Medication Adjustments/Labs and Tests Ordered: Current medicines are reviewed at length with the patient today.  Concerns regarding medicines are outlined above.  Medication changes, Labs and Tests ordered today are listed in the Patient Instructions below. Patient Instructions  Medication Instructions:  Your physician recommends that you continue on your current medications as directed. Please refer to the Current Medication list given to you today.   Labwork: None ordered  Testing/Procedures: Your physician has requested that you have an echocardiogram. Echocardiography is a painless test that uses sound waves to create images of your heart. It provides your doctor with information about the size and shape of your heart and how well your heart's chambers and valves are working.  This procedure takes approximately one hour. There are no restrictions for this procedure.  Your physician has recommended that you wear an event monitor. Event monitors are medical devices that record the heart's electrical activity. Doctors most often [place] these monitors to diagnose arrhythmias. Arrhythmias are problems with the speed or rhythm of the heartbeat. The monitor is a small, portable device. You can wear one while you do your normal daily activities. This is usually used to diagnose what is causing palpitations/syncope (passing out).   Follow-Up: Your physician recommends that you schedule a follow-up appointment in: 3 MONTHS WITH DR. [name]  Any Other Special Instructions Will Be Listed Below (If Applicable).  Echocardiogram An echocardiogram, or echocardiography, uses sound waves (ultrasound) to produce an image of your heart. The echocardiogram is simple, painless, obtained within a short period of time, and offers valuable information to your health care provider. The images from an echocardiogram can provide information such as:  Evidence of coronary artery disease (CAD).  Heart size.  Heart muscle function.  Heart valve function.  Aneurysm detection.  Evidence of a past heart attack.  Fluid buildup around the heart.  Heart muscle thickening.  Assess heart valve function. LET [facility] CARE PROVIDER KNOW ABOUT:  Any allergies you have.  All medicines you are taking, including vitamins, herbs, eye drops, creams, and over-the-counter medicines.  Previous problems you or members of your family have had with the use of anesthetics.  Any blood disorders you have.  Previous surgeries you have had.  Medical conditions you have.  Possibility of pregnancy, if this applies. BEFORE THE PROCEDURE  No special preparation is needed. Eat and drink normally.  PROCEDURE   In order to produce an image of your heart, gel will be applied to your chest and a wand-like tool  (transducer) will be moved over your chest. The gel will help transmit the sound waves from the transducer. The sound waves will harmlessly bounce off your heart to allow the heart images to be captured in real-time motion. These images will then be recorded.  You may need an IV to receive a medicine that improves the quality of the pictures. AFTER THE PROCEDURE You may return to your normal schedule including diet, activities, and medicines, unless your health care provider tells you otherwise.   This information is not intended to replace advice given to you by your health care provider. Make sure you discuss any questions you have with your health care provider.   Document Released: 09/30/2000 Document Revised: 10/24/2014 Document Reviewed: 06/10/2013 Elsevier Interactive Patient Education 2016 Else[employer] Inc[employer]    Cardiac Event Monitoring A cardiac event monitor is a small recording device used to help detect abnormal heart rhythms (arrhythmias). The monitor is used to record heart rhythm when noticeable symptoms such as the following occur:  Fast heartbeats (palpitations), such as heart racing or fluttering.  Dizziness.  Fainting or light-headedness.  Unexplained weakness. The monitor is wired to two electrodes placed on your chest. Electrodes are flat, sticky disks that attach to your skin. The monitor can be worn for up to 30 days. You will wear the monitor at all times, except when bathing.  HOW TO USE YOUR CARDIAC EVENT MONITOR A technician will prepare your chest for the electrode placement. The technician will show you how to place the electrodes, how to work the monitor, and how to replace the batteries. Take time to practice using the monitor before you leave the office. Make sure you understand how to send the information from the monitor to your health care provider. This requires a telephone with a landline, not a cell phone. You need to:  Wear your monitor at all times,  except when you are in water:  Do not get the monitor wet.  Take the monitor off when bathing. Do not swim or use a hot tub with it on.  Keep your skin clean. Do not put body lotion or moisturizer on your chest.  Change the electrodes daily or any time they stop sticking to your skin. You might need to use tape to keep them on.  It is possible that your skin under the electrodes could become irritated. To keep this from happening, try to put the electrodes in slightly different places on your chest. However, they must remain in the area under your left breast and in the upper right section of your chest.  Make sure the monitor is safely clipped to your clothing or in a location close to your body that your health care provider recommends.  Press the button to record when you feel symptoms of heart trouble, such as dizziness, weakness, light-headedness, palpitations, thumping, shortness of breath, unexplained weakness, or a fluttering or racing heart. The monitor is always on and records what happened slightly before you pressed the button, so do not worry about being too late to  get good information.  Keep a diary of your activities, such as walking, doing chores, and taking medicine. It is especially important to note what you were doing when you pushed the button to record your symptoms. This will help your health care provider determine what might be contributing to your symptoms. The information stored in your monitor will be reviewed by your health care provider alongside your diary entries.  Send the recorded information as recommended by your health care provider. It is important to understand that it will take some time for your health care provider to process the results.  Change the batteries as recommended by your health care provider. SEEK IMMEDIATE MEDICAL CARE IF:   You have chest pain.  You have extreme difficulty breathing or shortness of breath.  You develop a very fast  heartbeat that persists.  You develop dizziness that does not go away.  You faint or constantly feel you are about to faint.   This information is not intended to replace advice given to you by your health care provider. Make sure you discuss any questions you have with your health care provider.   Document Released: 07/12/2008 Document Revised: 10/24/2014 Document Reviewed: 04/01/2013 Elsevier Interactive Patient Education [employer]   If you need a refill on your cardiac medications before your next appointment, please call your pharmacy.      Signed, Cline Crock, PA-C  05/18/2016 11:35 AM    [facility] [address] [place], [place], [place]  [address] Phone: [phone]; Fax: [phone]

## 2016-05-18 ENCOUNTER — Ambulatory Visit (INDEPENDENT_AMBULATORY_CARE_PROVIDER_SITE_OTHER): Payer: Medicare Other | Admitting: Physician Assistant

## 2016-05-18 ENCOUNTER — Encounter: Payer: Self-pay | Admitting: Physician Assistant

## 2016-05-18 VITALS — BP 160/78 | HR 77 | Ht 67.0 in | Wt 169.8 lb

## 2016-05-18 DIAGNOSIS — I5032 Chronic diastolic (congestive) heart failure: Secondary | ICD-10-CM | POA: Diagnosis not present

## 2016-05-18 DIAGNOSIS — D510 Vitamin B12 deficiency anemia due to intrinsic factor deficiency: Secondary | ICD-10-CM | POA: Diagnosis not present

## 2016-05-18 DIAGNOSIS — I25708 Atherosclerosis of coronary artery bypass graft(s), unspecified, with other forms of angina pectoris: Secondary | ICD-10-CM

## 2016-05-18 DIAGNOSIS — I10 Essential (primary) hypertension: Secondary | ICD-10-CM | POA: Diagnosis not present

## 2016-05-18 DIAGNOSIS — E785 Hyperlipidemia, unspecified: Secondary | ICD-10-CM

## 2016-05-18 DIAGNOSIS — R002 Palpitations: Secondary | ICD-10-CM

## 2016-05-18 DIAGNOSIS — I950 Idiopathic hypotension: Secondary | ICD-10-CM

## 2016-05-18 DIAGNOSIS — I350 Nonrheumatic aortic (valve) stenosis: Secondary | ICD-10-CM | POA: Diagnosis not present

## 2016-05-18 NOTE — Patient Instructions (Addendum)
Medication Instructions:  Your physician recommends that you continue on your current medications as directed. Please refer to the Current Medication list given to you today.   Labwork: None ordered  Testing/Procedures: Your physician has requested that you have an echocardiogram. Echocardiography is a painless test that uses sound waves to create images of your heart. It provides your doctor with information about the size and shape of your heart and how well your heart's chambers and valves are working. This procedure takes approximately one hour. There are no restrictions for this procedure.  Your physician has recommended that you wear an event monitor. Event monitors are medical devices that record the heart's electrical activity. Doctors most often [place] these monitors to diagnose arrhythmias. Arrhythmias are problems with the speed or rhythm of the heartbeat. The monitor is a small, portable device. You can wear one while you do your normal daily activities. This is usually used to diagnose what is causing palpitations/syncope (passing out).   Follow-Up: Your physician recommends that you schedule a follow-up appointment in: 3 MONTHS WITH DR. [name]  Any Other Special Instructions Will Be Listed Below (If Applicable).  Echocardiogram An echocardiogram, or echocardiography, uses sound waves (ultrasound) to produce an image of your heart. The echocardiogram is simple, painless, obtained within a short period of time, and offers valuable information to your health care provider. The images from an echocardiogram can provide information such as:  Evidence of coronary artery disease (CAD).  Heart size.  Heart muscle function.  Heart valve function.  Aneurysm detection.  Evidence of a past heart attack.  Fluid buildup around the heart.  Heart muscle thickening.  Assess heart valve function. LET [facility] CARE PROVIDER KNOW ABOUT:  Any allergies you have.  All medicines you are  taking, including vitamins, herbs, eye drops, creams, and over-the-counter medicines.  Previous problems you or members of your family have had with the use of anesthetics.  Any blood disorders you have.  Previous surgeries you have had.  Medical conditions you have.  Possibility of pregnancy, if this applies. BEFORE THE PROCEDURE  No special preparation is needed. Eat and drink normally.  PROCEDURE   In order to produce an image of your heart, gel will be applied to your chest and a wand-like tool (transducer) will be moved over your chest. The gel will help transmit the sound waves from the transducer. The sound waves will harmlessly bounce off your heart to allow the heart images to be captured in real-time motion. These images will then be recorded.  You may need an IV to receive a medicine that improves the quality of the pictures. AFTER THE PROCEDURE You may return to your normal schedule including diet, activities, and medicines, unless your health care provider tells you otherwise.   This information is not intended to replace advice given to you by your health care provider. Make sure you discuss any questions you have with your health care provider.   Document Released: 09/30/2000 Document Revised: 10/24/2014 Document Reviewed: 06/10/2013 Elsevier Interactive Patient Education 2016 Else[employer] Inc[employer]    Cardiac Event Monitoring A cardiac event monitor is a small recording device used to help detect abnormal heart rhythms (arrhythmias). The monitor is used to record heart rhythm when noticeable symptoms such as the following occur:  Fast heartbeats (palpitations), such as heart racing or fluttering.  Dizziness.  Fainting or light-headedness.  Unexplained weakness. The monitor is wired to two electrodes placed on your chest. Electrodes are flat, sticky disks that attach  to your skin. The monitor can be worn for up to 30 days. You will wear the monitor at all times, except  when bathing.  HOW TO USE YOUR CARDIAC EVENT MONITOR A technician will prepare your chest for the electrode placement. The technician will show you how to place the electrodes, how to work the monitor, and how to replace the batteries. Take time to practice using the monitor before you leave the office. Make sure you understand how to send the information from the monitor to your health care provider. This requires a telephone with a landline, not a cell phone. You need to:  Wear your monitor at all times, except when you are in water:  Do not get the monitor wet.  Take the monitor off when bathing. Do not swim or use a hot tub with it on.  Keep your skin clean. Do not put body lotion or moisturizer on your chest.  Change the electrodes daily or any time they stop sticking to your skin. You might need to use tape to keep them on.  It is possible that your skin under the electrodes could become irritated. To keep this from happening, try to put the electrodes in slightly different places on your chest. However, they must remain in the area under your left breast and in the upper right section of your chest.  Make sure the monitor is safely clipped to your clothing or in a location close to your body that your health care provider recommends.  Press the button to record when you feel symptoms of heart trouble, such as dizziness, weakness, light-headedness, palpitations, thumping, shortness of breath, unexplained weakness, or a fluttering or racing heart. The monitor is always on and records what happened slightly before you pressed the button, so do not worry about being too late to get good information.  Keep a diary of your activities, such as walking, doing chores, and taking medicine. It is especially important to note what you were doing when you pushed the button to record your symptoms. This will help your health care provider determine what might be contributing to your symptoms. The  information stored in your monitor will be reviewed by your health care provider alongside your diary entries.  Send the recorded information as recommended by your health care provider. It is important to understand that it will take some time for your health care provider to process the results.  Change the batteries as recommended by your health care provider. SEEK IMMEDIATE MEDICAL CARE IF:   You have chest pain.  You have extreme difficulty breathing or shortness of breath.  You develop a very fast heartbeat that persists.  You develop dizziness that does not go away.  You faint or constantly feel you are about to faint.   This information is not intended to replace advice given to you by your health care provider. Make sure you discuss any questions you have with your health care provider.   Document Released: 07/12/2008 Document Revised: 10/24/2014 Document Reviewed: 04/01/2013 Elsevier Interactive Patient Education [employer]   If you need a refill on your cardiac medications before your next appointment, please call your pharmacy.

## 2016-05-26 DIAGNOSIS — D510 Vitamin B12 deficiency anemia due to intrinsic factor deficiency: Secondary | ICD-10-CM | POA: Diagnosis not present

## 2016-05-30 ENCOUNTER — Other Ambulatory Visit: Payer: Self-pay

## 2016-05-30 ENCOUNTER — Ambulatory Visit (HOSPITAL_COMMUNITY): Payer: Medicare Other | Attending: Cardiovascular Disease

## 2016-05-30 ENCOUNTER — Ambulatory Visit (INDEPENDENT_AMBULATORY_CARE_PROVIDER_SITE_OTHER): Payer: Medicare Other

## 2016-05-30 DIAGNOSIS — I119 Hypertensive heart disease without heart failure: Secondary | ICD-10-CM | POA: Diagnosis not present

## 2016-05-30 DIAGNOSIS — E119 Type 2 diabetes mellitus without complications: Secondary | ICD-10-CM | POA: Diagnosis not present

## 2016-05-30 DIAGNOSIS — I352 Nonrheumatic aortic (valve) stenosis with insufficiency: Secondary | ICD-10-CM | POA: Diagnosis not present

## 2016-05-30 DIAGNOSIS — E785 Hyperlipidemia, unspecified: Secondary | ICD-10-CM | POA: Insufficient documentation

## 2016-05-30 DIAGNOSIS — I059 Rheumatic mitral valve disease, unspecified: Secondary | ICD-10-CM | POA: Insufficient documentation

## 2016-05-30 DIAGNOSIS — R002 Palpitations: Secondary | ICD-10-CM

## 2016-05-30 DIAGNOSIS — I350 Nonrheumatic aortic (valve) stenosis: Secondary | ICD-10-CM

## 2016-05-30 DIAGNOSIS — Z951 Presence of aortocoronary bypass graft: Secondary | ICD-10-CM | POA: Insufficient documentation

## 2016-05-30 DIAGNOSIS — Z87891 Personal history of nicotine dependence: Secondary | ICD-10-CM | POA: Diagnosis not present

## 2016-05-30 DIAGNOSIS — I2510 Atherosclerotic heart disease of native coronary artery without angina pectoris: Secondary | ICD-10-CM | POA: Diagnosis not present

## 2016-05-30 DIAGNOSIS — Z8249 Family history of ischemic heart disease and other diseases of the circulatory system: Secondary | ICD-10-CM | POA: Diagnosis not present

## 2016-06-02 DIAGNOSIS — N3941 Urge incontinence: Secondary | ICD-10-CM | POA: Diagnosis not present

## 2016-06-13 DIAGNOSIS — H25812 Combined forms of age-related cataract, left eye: Secondary | ICD-10-CM | POA: Diagnosis not present

## 2016-06-13 DIAGNOSIS — Z7984 Long term (current) use of oral hypoglycemic drugs: Secondary | ICD-10-CM | POA: Diagnosis not present

## 2016-06-13 DIAGNOSIS — E119 Type 2 diabetes mellitus without complications: Secondary | ICD-10-CM | POA: Diagnosis not present

## 2016-06-13 DIAGNOSIS — H25813 Combined forms of age-related cataract, bilateral: Secondary | ICD-10-CM | POA: Diagnosis not present

## 2016-06-17 DIAGNOSIS — E119 Type 2 diabetes mellitus without complications: Secondary | ICD-10-CM | POA: Diagnosis not present

## 2016-06-23 DIAGNOSIS — N3941 Urge incontinence: Secondary | ICD-10-CM | POA: Diagnosis not present

## 2016-06-27 DIAGNOSIS — D510 Vitamin B12 deficiency anemia due to intrinsic factor deficiency: Secondary | ICD-10-CM | POA: Diagnosis not present

## 2016-06-27 DIAGNOSIS — Z23 Encounter for immunization: Secondary | ICD-10-CM | POA: Diagnosis not present

## 2016-07-14 DIAGNOSIS — N3941 Urge incontinence: Secondary | ICD-10-CM | POA: Diagnosis not present

## 2016-07-26 DIAGNOSIS — Z8673 Personal history of transient ischemic attack (TIA), and cerebral infarction without residual deficits: Secondary | ICD-10-CM | POA: Diagnosis not present

## 2016-07-26 DIAGNOSIS — I10 Essential (primary) hypertension: Secondary | ICD-10-CM | POA: Diagnosis not present

## 2016-07-26 DIAGNOSIS — I2510 Atherosclerotic heart disease of native coronary artery without angina pectoris: Secondary | ICD-10-CM | POA: Diagnosis not present

## 2016-07-26 DIAGNOSIS — Z87891 Personal history of nicotine dependence: Secondary | ICD-10-CM | POA: Diagnosis not present

## 2016-07-26 DIAGNOSIS — Z7984 Long term (current) use of oral hypoglycemic drugs: Secondary | ICD-10-CM | POA: Diagnosis not present

## 2016-07-26 DIAGNOSIS — Z7982 Long term (current) use of aspirin: Secondary | ICD-10-CM | POA: Diagnosis not present

## 2016-07-26 DIAGNOSIS — H25812 Combined forms of age-related cataract, left eye: Secondary | ICD-10-CM | POA: Diagnosis not present

## 2016-07-26 DIAGNOSIS — E1142 Type 2 diabetes mellitus with diabetic polyneuropathy: Secondary | ICD-10-CM | POA: Diagnosis not present

## 2016-07-26 DIAGNOSIS — E785 Hyperlipidemia, unspecified: Secondary | ICD-10-CM | POA: Diagnosis not present

## 2016-07-26 DIAGNOSIS — H25813 Combined forms of age-related cataract, bilateral: Secondary | ICD-10-CM | POA: Diagnosis not present

## 2016-07-26 DIAGNOSIS — N400 Enlarged prostate without lower urinary tract symptoms: Secondary | ICD-10-CM | POA: Diagnosis not present

## 2016-07-27 DIAGNOSIS — H25811 Combined forms of age-related cataract, right eye: Secondary | ICD-10-CM | POA: Diagnosis not present

## 2016-07-27 DIAGNOSIS — Z7984 Long term (current) use of oral hypoglycemic drugs: Secondary | ICD-10-CM | POA: Diagnosis not present

## 2016-07-27 DIAGNOSIS — Z87891 Personal history of nicotine dependence: Secondary | ICD-10-CM | POA: Diagnosis not present

## 2016-07-27 DIAGNOSIS — Z48810 Encounter for surgical aftercare following surgery on the sense organs: Secondary | ICD-10-CM | POA: Diagnosis not present

## 2016-07-27 DIAGNOSIS — Z9842 Cataract extraction status, left eye: Secondary | ICD-10-CM | POA: Diagnosis not present

## 2016-07-27 DIAGNOSIS — Z961 Presence of intraocular lens: Secondary | ICD-10-CM | POA: Diagnosis not present

## 2016-07-27 DIAGNOSIS — E119 Type 2 diabetes mellitus without complications: Secondary | ICD-10-CM | POA: Diagnosis not present

## 2016-08-09 DIAGNOSIS — N3941 Urge incontinence: Secondary | ICD-10-CM | POA: Diagnosis not present

## 2016-08-10 DIAGNOSIS — E1136 Type 2 diabetes mellitus with diabetic cataract: Secondary | ICD-10-CM | POA: Diagnosis not present

## 2016-08-10 DIAGNOSIS — Z87891 Personal history of nicotine dependence: Secondary | ICD-10-CM | POA: Diagnosis not present

## 2016-08-10 DIAGNOSIS — I5032 Chronic diastolic (congestive) heart failure: Secondary | ICD-10-CM | POA: Diagnosis not present

## 2016-08-10 DIAGNOSIS — I2510 Atherosclerotic heart disease of native coronary artery without angina pectoris: Secondary | ICD-10-CM | POA: Diagnosis not present

## 2016-08-10 DIAGNOSIS — I739 Peripheral vascular disease, unspecified: Secondary | ICD-10-CM | POA: Diagnosis not present

## 2016-08-10 DIAGNOSIS — Z48810 Encounter for surgical aftercare following surgery on the sense organs: Secondary | ICD-10-CM | POA: Diagnosis not present

## 2016-08-10 DIAGNOSIS — E785 Hyperlipidemia, unspecified: Secondary | ICD-10-CM | POA: Diagnosis not present

## 2016-08-10 DIAGNOSIS — Z888 Allergy status to other drugs, medicaments and biological substances status: Secondary | ICD-10-CM | POA: Diagnosis not present

## 2016-08-10 DIAGNOSIS — E1142 Type 2 diabetes mellitus with diabetic polyneuropathy: Secondary | ICD-10-CM | POA: Diagnosis not present

## 2016-08-10 DIAGNOSIS — I110 Hypertensive heart disease with heart failure: Secondary | ICD-10-CM | POA: Diagnosis not present

## 2016-08-16 DIAGNOSIS — G450 Vertebro-basilar artery syndrome: Secondary | ICD-10-CM | POA: Diagnosis not present

## 2016-08-16 DIAGNOSIS — H52201 Unspecified astigmatism, right eye: Secondary | ICD-10-CM | POA: Diagnosis not present

## 2016-08-16 DIAGNOSIS — E1142 Type 2 diabetes mellitus with diabetic polyneuropathy: Secondary | ICD-10-CM | POA: Diagnosis not present

## 2016-08-16 DIAGNOSIS — H1132 Conjunctival hemorrhage, left eye: Secondary | ICD-10-CM | POA: Diagnosis not present

## 2016-08-16 DIAGNOSIS — H40053 Ocular hypertension, bilateral: Secondary | ICD-10-CM | POA: Diagnosis not present

## 2016-08-16 DIAGNOSIS — M1710 Unilateral primary osteoarthritis, unspecified knee: Secondary | ICD-10-CM | POA: Diagnosis not present

## 2016-08-16 DIAGNOSIS — N3281 Overactive bladder: Secondary | ICD-10-CM | POA: Diagnosis not present

## 2016-08-16 DIAGNOSIS — H25811 Combined forms of age-related cataract, right eye: Secondary | ICD-10-CM | POA: Diagnosis not present

## 2016-08-16 DIAGNOSIS — N400 Enlarged prostate without lower urinary tract symptoms: Secondary | ICD-10-CM | POA: Diagnosis not present

## 2016-08-16 DIAGNOSIS — J309 Allergic rhinitis, unspecified: Secondary | ICD-10-CM | POA: Diagnosis not present

## 2016-08-17 DIAGNOSIS — Z87891 Personal history of nicotine dependence: Secondary | ICD-10-CM | POA: Diagnosis not present

## 2016-08-17 DIAGNOSIS — Z9841 Cataract extraction status, right eye: Secondary | ICD-10-CM | POA: Diagnosis not present

## 2016-08-17 DIAGNOSIS — Z48810 Encounter for surgical aftercare following surgery on the sense organs: Secondary | ICD-10-CM | POA: Diagnosis not present

## 2016-08-17 DIAGNOSIS — Z961 Presence of intraocular lens: Secondary | ICD-10-CM | POA: Diagnosis not present

## 2016-08-24 DIAGNOSIS — H04123 Dry eye syndrome of bilateral lacrimal glands: Secondary | ICD-10-CM | POA: Diagnosis not present

## 2016-08-24 DIAGNOSIS — Z9841 Cataract extraction status, right eye: Secondary | ICD-10-CM | POA: Diagnosis not present

## 2016-08-24 DIAGNOSIS — Z48810 Encounter for surgical aftercare following surgery on the sense organs: Secondary | ICD-10-CM | POA: Diagnosis not present

## 2016-08-24 DIAGNOSIS — Z961 Presence of intraocular lens: Secondary | ICD-10-CM | POA: Diagnosis not present

## 2016-09-01 ENCOUNTER — Ambulatory Visit (INDEPENDENT_AMBULATORY_CARE_PROVIDER_SITE_OTHER): Payer: Medicare Other | Admitting: Interventional Cardiology

## 2016-09-01 ENCOUNTER — Encounter: Payer: Self-pay | Admitting: Interventional Cardiology

## 2016-09-01 VITALS — BP 160/76 | HR 82 | Ht 67.0 in | Wt 173.0 lb

## 2016-09-01 DIAGNOSIS — E782 Mixed hyperlipidemia: Secondary | ICD-10-CM

## 2016-09-01 DIAGNOSIS — R42 Dizziness and giddiness: Secondary | ICD-10-CM

## 2016-09-01 DIAGNOSIS — I25810 Atherosclerosis of coronary artery bypass graft(s) without angina pectoris: Secondary | ICD-10-CM

## 2016-09-01 DIAGNOSIS — I5032 Chronic diastolic (congestive) heart failure: Secondary | ICD-10-CM

## 2016-09-01 DIAGNOSIS — I350 Nonrheumatic aortic (valve) stenosis: Secondary | ICD-10-CM | POA: Diagnosis not present

## 2016-09-01 DIAGNOSIS — N3941 Urge incontinence: Secondary | ICD-10-CM | POA: Diagnosis not present

## 2016-09-01 DIAGNOSIS — I10 Essential (primary) hypertension: Secondary | ICD-10-CM | POA: Diagnosis not present

## 2016-09-01 DIAGNOSIS — D510 Vitamin B12 deficiency anemia due to intrinsic factor deficiency: Secondary | ICD-10-CM | POA: Diagnosis not present

## 2016-09-01 NOTE — Progress Notes (Signed)
Cardiology Office Note    Date:  09/01/2016   ID:  Gregory Barry, DOB 08/18/1921, MRN [id number]  PCP:  Lillia MountainGRIFFIN,Jude JOSEPH, MD  Cardiologist: Lesleigh NoeHenry W Smith III, MD   Chief Complaint  Patient presents with  . Coronary Artery Disease    History of Present Illness:  Gregory Barry is a 82 y.o. male CAD, hypertension, Aortic stenosis, previous coronary bypass grafting, hypertension, and chronic recurring episodes of dizziness.  He is unable to exercise because of chronic back discomfort. His wife is somewhat aggravated by the fact that he sits all the time in front of his computer. He denies angina. He has not had syncope. Is no lower extremity swelling.  Past Medical History:  Diagnosis Date  . Allergic rhinitis   . Amnesia, global, transient    Episode was very brief & not associated with other neurlogical findings.  . Balance problem   . BPH (benign prostatic hyperplasia)   . CAD (coronary artery disease)   . Calculus of kidney   . Cataract   . Chronic lower back pain   . Constipated   . Coronary atherosclerosis    With sequential SVG to D#1 and #2, sequential SVG to OM #1 and #2, SVG to PDA, and LIMA to LAD. Patent grafts 2205 and no ischemia by Nuc 09/2009 [name]  . Discoid lupus   . Dizziness    With standing but could not document orthostasis today  . Essential hypertension, benign    followed by Dr. Mendel RyderH. Smith - last seen late March, 2016, he wishes for the pt. to have further testing prior to clearing for surgery   . Fungal infection of toenail   . Hearing loss   . Insomnia, unspecified   . Meralgia paresthetica 1997  . Mixed hyperlipidemia   . Osteoarthrosis, unspecified whether generalized or localized, lower leg    knees, back    . Overweight(278.02)   . Periarthritis 2001   Left shoulder  . Pneumonia    "when I was a child"  . Polyneuropathy in diabetes(357.2)   . Stroke (HCC) 01/2011   R BG CVA on MRI, MRA occluded R vertebral artery  . Triggering of finger     Left thumb and right fourth finger  . Type II diabetes mellitus (HCC)     Past Surgical History:  Procedure Laterality Date  . BACK SURGERY    . CORONARY ANGIOPLASTY WITH STENT PLACEMENT     With sequential SVG to D#1 and #2, sequential SVG to OM #1 and #2, SVG to PDA, and LIMA to LAD. Patent grafts 2205 and no ischemia by Nuc 09/2009 [name]  . CORONARY ARTERY BYPASS GRAFT  1997   "CABG X5"  . HEMILAMINOTOMY LUMBAR SPINE  02/12/2015   Decompressive lumbar hemilaminectomy, medial facetectomy, foraminotomies at L1-2 and L2-3 on the left followed by sublaminar decompression  . INGUINAL HERNIA REPAIR Right 1997  . JOINT REPLACEMENT    . LESION REMOVAL     Benign lesions  . LITHOTRIPSY     [name]  . LUMBAR LAMINECTOMY  2006   "Dr. Yetta BarreJones"  . LUMBAR LAMINECTOMY/DECOMPRESSION MICRODISCECTOMY N/A 02/12/2015   Procedure: Laminectomy - Lumbar one -two lumbar two -three;  Surgeon: Tia Alertavid S Jones, MD;  Location: [facility];  Service: Neurosurgery;  Laterality: N/A;  . TONSILLECTOMY    . TOTAL KNEE ARTHROPLASTY Left ~ 2005   [name]  . TOTAL KNEE ARTHROPLASTY Right 07/29/11    Current Medications: Outpatient Medications Prior to  Visit  Medication Sig Dispense Refill  . aspirin EC 81 MG tablet Take 162 mg by mouth daily.    . cholecalciferol (VITAMIN D) 1000 UNITS tablet Take 1,000 Units by mouth daily.    . fluocinonide cream (LIDEX) 0.05 % Apply 1 application topically as needed (contact dermatitis).    . FREESTYLE LITE test strip 1 strip by Other route 4 (four) times daily. Use 1 strip to check glucose four times a day  0  . hydroxypropyl methylcellulose (ISOPTO TEARS) 2.5 % ophthalmic solution Place 1 drop into both eyes 4 (four) times daily.     . metFORMIN (GLUCOPHAGE) 500 MG tablet Take 500 mg by mouth 2 (two) times daily with a meal.    . metoprolol succinate (TOPROL XL) 25 MG 24 hr tablet Take 1 tablet (25 mg total) by mouth daily. 90 tablet 3  . mirabegron ER (MYRBETRIQ) 50  MG TB24 tablet Take 50 mg by mouth daily.    . nitroGLYCERIN (NITROSTAT) 0.4 MG SL tablet Place 0.4 mg under the tongue every 5 (five) minutes as needed for chest pain (3 DOSES MAX).    . Omega-3 Fatty Acids (FISH OIL) 1200 MG CAPS Take 1 capsule by mouth 2 (two) times daily.    . pioglitazone (ACTOS) 15 MG tablet Take 15 mg by mouth 2 (two) times daily.    . polyethylene glycol (MIRALAX / GLYCOLAX) packet Take 17 g by mouth at bedtime as needed for mild constipation.     . pravastatin (PRAVACHOL) 20 MG tablet Take 20 mg by mouth daily.    . tamsulosin (FLOMAX) 0.4 MG CAPS capsule Take 1 capsule 30 minutes after the same meal each day     No facility-administered medications prior to visit.      Allergies:   Simvastatin and Sporanos [itraconazole]   Social History   Social History  . Marital status: Married    Spouse name: N/A  . Number of children: N/A  . Years of education: N/A   Social History Main Topics  . Smoking status: Former Smoker    Packs/day: 1.00    Years: 20.00    Types: Cigarettes  . Smokeless tobacco: Never Used     Comment: "quit smoking cigarettes in 1964"  . Alcohol use 3.0 oz/week    5 Shots of liquor per week     Comment: 02/12/2015 "one, 1 shot cocktail before dinner 5 nights/week"  . Drug use: No  . Sexual activity: No   Other Topics Concern  . None   Social History Narrative  . None     Family History:  The patient's family history includes Cancer in his maternal grandmother; Congestive Heart Failure in his father and mother; Down syndrome in his brother; Heart failure in his father and other; Hypertension in his mother; Pneumonia in his brother; Stroke in his mother and other; Thrombosis in his mother.   ROS:   Please see the history of present illness.    Severe chronic low back discomfort. Dyspnea on exertion.  All other systems reviewed and are negative.   PHYSICAL EXAM:   VS:  BP (!) 160/76   Pulse 82   Ht 5\' 7"  (1.702 m)   Wt 173 lb  (78.5 kg)   BMI 27.10 kg/m    GEN: Well nourished, well developed, in no acute distress  HEENT: normal  Neck: no JVD, carotid bruits, or masses. Cardiac: RRR; 3/6 systolic murmur, rubs, or gallops,no edema  Respiratory:  clear to auscultation  bilaterally, normal work of breathing GI: soft, nontender, nondistended, + BS MS: no deformity or atrophy  Skin: warm and dry, no rash Neuro:  Alert and Oriented x 3, Strength and sensation are intact Psych: euthymic mood, full affect  Wt Readings from Last 3 Encounters:  09/01/16 173 lb (78.5 kg)  05/18/16 169 lb 12.8 oz (77 kg)  01/08/16 173 lb (78.5 kg)      Studies/Labs Reviewed:   EKG:  EKG  Is not repeated  Recent Labs: No results found for requested labs within last 8760 hours.   Lipid Panel No results found for: CHOL, TRIG, HDL, CHOLHDL, VLDL, LDLCALC, LDLDIRECT  Additional studies/ records that were reviewed today include:  Thirty-day continuous monitor performed in August was unremarkable. Normal sinus rhythm.    ASSESSMENT:    1. Moderate calcific aortic stenosis   2. Coronary artery disease involving coronary bypass graft of native heart without angina pectoris   3. Essential hypertension   4. Chronic diastolic heart failure (HCC)   5. Dizziness   6. Mixed hyperlipidemia   7. Aortic valve stenosis, etiology of cardiac valve disease unspecified      PLAN:  In order of problems listed above:  1. Stable without angina. 2. Blood pressure is high normal range. We are desiring blood pressures less than 145/90 mmHg at his age. 3. 2 g sodium diet is recommended 4. No evidence of volume overload although occasional orthopnea is concerning. Probably related to stiff LV, coronary disease, and aortic stenosis. 5. Continues to be a problem. Not related to orthostasis or rhythm disturbance. Aortic stenosis is not severe enough to be causing this problem.    Medication Adjustments/Labs and Tests Ordered: Current medicines  are reviewed at length with the patient today.  Concerns regarding medicines are outlined above.  Medication changes, Labs and Tests ordered today are listed in the Patient Instructions below. Patient Instructions  Medication Instructions:  None  Labwork: None  Testing/Procedures: Your physician has requested that you have an echocardiogram in August 2018. Echocardiography is a painless test that uses sound waves to create images of your heart. It provides your doctor with information about the size and shape of your heart and how well your heart's chambers and valves are working. This procedure takes approximately one hour. There are no restrictions for this procedure.    Follow-Up: Your physician wants you to follow-up in: September 2018. You will receive a reminder letter in the mail two months in advance. If you don't receive a letter, please call our office to schedule the follow-up appointment.   Any Other Special Instructions Will Be Listed Below (If Applicable).     If you need a refill on your cardiac medications before your next appointment, please call your pharmacy.      Signed, Lesleigh NoeHenry W Smith III, MD  09/01/2016 11:49 AM    [facility] [address], [place], [place]  [address] Phone: [phone]; Fax: [phone]

## 2016-09-01 NOTE — Patient Instructions (Signed)
Medication Instructions:  None  Labwork: None  Testing/Procedures: Your physician has requested that you have an echocardiogram in August 2018. Echocardiography is a painless test that uses sound waves to create images of your heart. It provides your doctor with information about the size and shape of your heart and how well your heart's chambers and valves are working. This procedure takes approximately one hour. There are no restrictions for this procedure.    Follow-Up: Your physician wants you to follow-up in: September 2018. You will receive a reminder letter in the mail two months in advance. If you don't receive a letter, please call our office to schedule the follow-up appointment.   Any Other Special Instructions Will Be Listed Below (If Applicable).     If you need a refill on your cardiac medications before your next appointment, please call your pharmacy.

## 2016-09-02 DIAGNOSIS — E119 Type 2 diabetes mellitus without complications: Secondary | ICD-10-CM | POA: Diagnosis not present

## 2016-09-15 DIAGNOSIS — E119 Type 2 diabetes mellitus without complications: Secondary | ICD-10-CM | POA: Diagnosis not present

## 2016-09-22 DIAGNOSIS — N3941 Urge incontinence: Secondary | ICD-10-CM | POA: Diagnosis not present

## 2016-10-05 DIAGNOSIS — H04123 Dry eye syndrome of bilateral lacrimal glands: Secondary | ICD-10-CM | POA: Diagnosis not present

## 2016-10-05 DIAGNOSIS — E785 Hyperlipidemia, unspecified: Secondary | ICD-10-CM | POA: Diagnosis not present

## 2016-10-05 DIAGNOSIS — Z87891 Personal history of nicotine dependence: Secondary | ICD-10-CM | POA: Diagnosis not present

## 2016-10-05 DIAGNOSIS — I2510 Atherosclerotic heart disease of native coronary artery without angina pectoris: Secondary | ICD-10-CM | POA: Diagnosis not present

## 2016-10-05 DIAGNOSIS — I110 Hypertensive heart disease with heart failure: Secondary | ICD-10-CM | POA: Diagnosis not present

## 2016-10-05 DIAGNOSIS — H18423 Band keratopathy, bilateral: Secondary | ICD-10-CM | POA: Diagnosis not present

## 2016-10-05 DIAGNOSIS — Z8673 Personal history of transient ischemic attack (TIA), and cerebral infarction without residual deficits: Secondary | ICD-10-CM | POA: Diagnosis not present

## 2016-10-05 DIAGNOSIS — I5032 Chronic diastolic (congestive) heart failure: Secondary | ICD-10-CM | POA: Diagnosis not present

## 2016-10-05 DIAGNOSIS — Z48810 Encounter for surgical aftercare following surgery on the sense organs: Secondary | ICD-10-CM | POA: Diagnosis not present

## 2016-10-05 DIAGNOSIS — E1142 Type 2 diabetes mellitus with diabetic polyneuropathy: Secondary | ICD-10-CM | POA: Diagnosis not present

## 2016-10-13 DIAGNOSIS — N3941 Urge incontinence: Secondary | ICD-10-CM | POA: Diagnosis not present

## 2016-10-27 DIAGNOSIS — Z961 Presence of intraocular lens: Secondary | ICD-10-CM | POA: Diagnosis not present

## 2016-10-30 IMAGING — NM NM MYOCAR MULTI W/ SPECT
3 series · 18 of 18 positions shown · non-contrast
Comparison: none

[Series 1: rest · 6.51mm/px · 6 of 64 frames shown]
[frame 6/64]
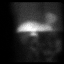
[frame 16/64]
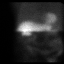
[frame 27/64]
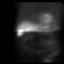
[frame 38/64]
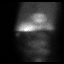
[frame 48/64]
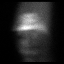
[frame 59/64]
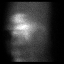

[Series 2: stress · 6.51mm/px · 6 of 512 frames shown (1 of 2)]
[frame 43/512]
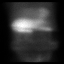
[frame 128/512]
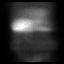
[frame 214/512]
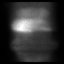
[frame 299/512]
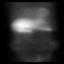
[frame 384/512]
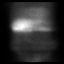
[frame 470/512]
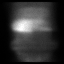

[Series 2: stress · 6.51mm/px · 6 of 64 frames shown (2 of 2)]
[frame 6/64]
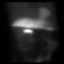
[frame 16/64]
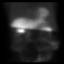
[frame 27/64]
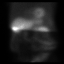
[frame 38/64]
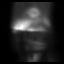
[frame 48/64]
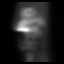
[frame 59/64]
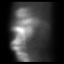

[18 of 18 positions shown; findings below may reference images not displayed]

Canned report from images found in remote index.

Refer to host system for actual result text.

## 2016-11-08 DIAGNOSIS — E1142 Type 2 diabetes mellitus with diabetic polyneuropathy: Secondary | ICD-10-CM | POA: Diagnosis not present

## 2016-11-08 DIAGNOSIS — Z1389 Encounter for screening for other disorder: Secondary | ICD-10-CM | POA: Diagnosis not present

## 2016-11-08 DIAGNOSIS — Z0000 Encounter for general adult medical examination without abnormal findings: Secondary | ICD-10-CM | POA: Diagnosis not present

## 2016-11-08 DIAGNOSIS — I10 Essential (primary) hypertension: Secondary | ICD-10-CM | POA: Diagnosis not present

## 2016-11-08 DIAGNOSIS — E538 Deficiency of other specified B group vitamins: Secondary | ICD-10-CM | POA: Diagnosis not present

## 2016-11-08 DIAGNOSIS — Z7984 Long term (current) use of oral hypoglycemic drugs: Secondary | ICD-10-CM | POA: Diagnosis not present

## 2016-11-10 DIAGNOSIS — N3941 Urge incontinence: Secondary | ICD-10-CM | POA: Diagnosis not present

## 2016-12-01 DIAGNOSIS — J0110 Acute frontal sinusitis, unspecified: Secondary | ICD-10-CM | POA: Diagnosis not present

## 2016-12-01 DIAGNOSIS — J069 Acute upper respiratory infection, unspecified: Secondary | ICD-10-CM | POA: Diagnosis not present

## 2016-12-08 DIAGNOSIS — N3941 Urge incontinence: Secondary | ICD-10-CM | POA: Diagnosis not present

## 2016-12-19 DIAGNOSIS — N3281 Overactive bladder: Secondary | ICD-10-CM | POA: Diagnosis not present

## 2016-12-19 DIAGNOSIS — R972 Elevated prostate specific antigen [PSA]: Secondary | ICD-10-CM | POA: Diagnosis not present

## 2016-12-19 DIAGNOSIS — R351 Nocturia: Secondary | ICD-10-CM | POA: Diagnosis not present

## 2016-12-19 DIAGNOSIS — N5201 Erectile dysfunction due to arterial insufficiency: Secondary | ICD-10-CM | POA: Diagnosis not present

## 2016-12-29 DIAGNOSIS — N3941 Urge incontinence: Secondary | ICD-10-CM | POA: Diagnosis not present

## 2017-01-09 ENCOUNTER — Other Ambulatory Visit: Payer: Self-pay | Admitting: Interventional Cardiology

## 2017-01-26 DIAGNOSIS — N3941 Urge incontinence: Secondary | ICD-10-CM | POA: Diagnosis not present

## 2017-02-09 DIAGNOSIS — Z7984 Long term (current) use of oral hypoglycemic drugs: Secondary | ICD-10-CM | POA: Diagnosis not present

## 2017-02-09 DIAGNOSIS — E1142 Type 2 diabetes mellitus with diabetic polyneuropathy: Secondary | ICD-10-CM | POA: Diagnosis not present

## 2017-02-23 DIAGNOSIS — N3941 Urge incontinence: Secondary | ICD-10-CM | POA: Diagnosis not present

## 2017-03-20 DIAGNOSIS — I10 Essential (primary) hypertension: Secondary | ICD-10-CM | POA: Diagnosis not present

## 2017-03-20 DIAGNOSIS — R42 Dizziness and giddiness: Secondary | ICD-10-CM | POA: Diagnosis not present

## 2017-03-23 DIAGNOSIS — N3941 Urge incontinence: Secondary | ICD-10-CM | POA: Diagnosis not present

## 2017-04-15 DIAGNOSIS — J069 Acute upper respiratory infection, unspecified: Secondary | ICD-10-CM | POA: Diagnosis not present

## 2017-04-15 DIAGNOSIS — R05 Cough: Secondary | ICD-10-CM | POA: Diagnosis not present

## 2017-04-20 DIAGNOSIS — J069 Acute upper respiratory infection, unspecified: Secondary | ICD-10-CM | POA: Diagnosis not present

## 2017-04-20 DIAGNOSIS — R05 Cough: Secondary | ICD-10-CM | POA: Diagnosis not present

## 2017-04-20 DIAGNOSIS — N3941 Urge incontinence: Secondary | ICD-10-CM | POA: Diagnosis not present

## 2017-04-25 DIAGNOSIS — N401 Enlarged prostate with lower urinary tract symptoms: Secondary | ICD-10-CM | POA: Diagnosis not present

## 2017-04-25 DIAGNOSIS — I10 Essential (primary) hypertension: Secondary | ICD-10-CM | POA: Diagnosis not present

## 2017-04-25 DIAGNOSIS — I2510 Atherosclerotic heart disease of native coronary artery without angina pectoris: Secondary | ICD-10-CM | POA: Diagnosis not present

## 2017-04-25 DIAGNOSIS — E119 Type 2 diabetes mellitus without complications: Secondary | ICD-10-CM | POA: Diagnosis not present

## 2017-05-11 DIAGNOSIS — N3941 Urge incontinence: Secondary | ICD-10-CM | POA: Diagnosis not present

## 2017-06-01 ENCOUNTER — Ambulatory Visit (HOSPITAL_COMMUNITY): Payer: Medicare Other | Attending: Cardiology

## 2017-06-01 ENCOUNTER — Other Ambulatory Visit: Payer: Self-pay

## 2017-06-01 DIAGNOSIS — I350 Nonrheumatic aortic (valve) stenosis: Secondary | ICD-10-CM | POA: Insufficient documentation

## 2017-06-01 DIAGNOSIS — I517 Cardiomegaly: Secondary | ICD-10-CM | POA: Diagnosis not present

## 2017-06-01 DIAGNOSIS — N3941 Urge incontinence: Secondary | ICD-10-CM | POA: Diagnosis not present

## 2017-06-05 ENCOUNTER — Telehealth: Payer: Self-pay | Admitting: Interventional Cardiology

## 2017-06-05 NOTE — Telephone Encounter (Signed)
Informed pt of echo results. Pt verbalized understanding. 

## 2017-06-05 NOTE — Telephone Encounter (Signed)
F/u message ° °Pt returning RN call about echo results. Please call back to discuss  °

## 2017-06-22 DIAGNOSIS — N3941 Urge incontinence: Secondary | ICD-10-CM | POA: Diagnosis not present

## 2017-07-10 DIAGNOSIS — R42 Dizziness and giddiness: Secondary | ICD-10-CM | POA: Diagnosis not present

## 2017-07-10 DIAGNOSIS — I350 Nonrheumatic aortic (valve) stenosis: Secondary | ICD-10-CM | POA: Diagnosis not present

## 2017-07-10 DIAGNOSIS — I10 Essential (primary) hypertension: Secondary | ICD-10-CM | POA: Diagnosis not present

## 2017-07-10 DIAGNOSIS — E119 Type 2 diabetes mellitus without complications: Secondary | ICD-10-CM | POA: Diagnosis not present

## 2017-07-13 DIAGNOSIS — N3941 Urge incontinence: Secondary | ICD-10-CM | POA: Diagnosis not present

## 2017-07-20 DIAGNOSIS — R42 Dizziness and giddiness: Secondary | ICD-10-CM | POA: Diagnosis not present

## 2017-07-21 DIAGNOSIS — R42 Dizziness and giddiness: Secondary | ICD-10-CM | POA: Diagnosis not present

## 2017-07-21 DIAGNOSIS — I6501 Occlusion and stenosis of right vertebral artery: Secondary | ICD-10-CM | POA: Diagnosis not present

## 2017-07-21 DIAGNOSIS — I6782 Cerebral ischemia: Secondary | ICD-10-CM | POA: Diagnosis not present

## 2017-07-21 DIAGNOSIS — R260 Ataxic gait: Secondary | ICD-10-CM | POA: Diagnosis not present

## 2017-08-02 DIAGNOSIS — E1142 Type 2 diabetes mellitus with diabetic polyneuropathy: Secondary | ICD-10-CM | POA: Diagnosis not present

## 2017-08-03 DIAGNOSIS — N3941 Urge incontinence: Secondary | ICD-10-CM | POA: Diagnosis not present

## 2017-08-04 DIAGNOSIS — M47891 Other spondylosis, occipito-atlanto-axial region: Secondary | ICD-10-CM | POA: Diagnosis not present

## 2017-08-04 DIAGNOSIS — M50321 Other cervical disc degeneration at C4-C5 level: Secondary | ICD-10-CM | POA: Diagnosis not present

## 2017-08-04 DIAGNOSIS — M50323 Other cervical disc degeneration at C6-C7 level: Secondary | ICD-10-CM | POA: Diagnosis not present

## 2017-08-04 DIAGNOSIS — M50322 Other cervical disc degeneration at C5-C6 level: Secondary | ICD-10-CM | POA: Diagnosis not present

## 2017-08-04 DIAGNOSIS — M47812 Spondylosis without myelopathy or radiculopathy, cervical region: Secondary | ICD-10-CM | POA: Diagnosis not present

## 2017-08-04 DIAGNOSIS — M419 Scoliosis, unspecified: Secondary | ICD-10-CM | POA: Diagnosis not present

## 2017-08-07 DIAGNOSIS — I2510 Atherosclerotic heart disease of native coronary artery without angina pectoris: Secondary | ICD-10-CM | POA: Diagnosis not present

## 2017-08-07 DIAGNOSIS — M542 Cervicalgia: Secondary | ICD-10-CM | POA: Diagnosis not present

## 2017-08-07 DIAGNOSIS — I10 Essential (primary) hypertension: Secondary | ICD-10-CM | POA: Diagnosis not present

## 2017-08-07 DIAGNOSIS — I350 Nonrheumatic aortic (valve) stenosis: Secondary | ICD-10-CM | POA: Diagnosis not present

## 2017-08-14 DIAGNOSIS — I2510 Atherosclerotic heart disease of native coronary artery without angina pectoris: Secondary | ICD-10-CM | POA: Diagnosis not present

## 2017-08-14 DIAGNOSIS — R42 Dizziness and giddiness: Secondary | ICD-10-CM | POA: Diagnosis not present

## 2017-08-14 DIAGNOSIS — M4802 Spinal stenosis, cervical region: Secondary | ICD-10-CM | POA: Diagnosis not present

## 2017-08-16 DIAGNOSIS — R42 Dizziness and giddiness: Secondary | ICD-10-CM | POA: Diagnosis not present

## 2017-08-16 DIAGNOSIS — R260 Ataxic gait: Secondary | ICD-10-CM | POA: Diagnosis not present

## 2017-08-21 DIAGNOSIS — R42 Dizziness and giddiness: Secondary | ICD-10-CM | POA: Diagnosis not present

## 2017-08-23 DIAGNOSIS — R260 Ataxic gait: Secondary | ICD-10-CM | POA: Diagnosis not present

## 2017-08-24 DIAGNOSIS — N3941 Urge incontinence: Secondary | ICD-10-CM | POA: Diagnosis not present

## 2017-08-31 DIAGNOSIS — R260 Ataxic gait: Secondary | ICD-10-CM | POA: Diagnosis not present

## 2017-09-05 DIAGNOSIS — R260 Ataxic gait: Secondary | ICD-10-CM | POA: Diagnosis not present

## 2017-09-12 DIAGNOSIS — R260 Ataxic gait: Secondary | ICD-10-CM | POA: Diagnosis not present

## 2017-09-14 DIAGNOSIS — N3941 Urge incontinence: Secondary | ICD-10-CM | POA: Diagnosis not present

## 2017-09-19 DIAGNOSIS — R260 Ataxic gait: Secondary | ICD-10-CM | POA: Diagnosis not present

## 2017-09-22 DIAGNOSIS — R002 Palpitations: Secondary | ICD-10-CM | POA: Diagnosis not present

## 2017-09-22 DIAGNOSIS — I2510 Atherosclerotic heart disease of native coronary artery without angina pectoris: Secondary | ICD-10-CM | POA: Diagnosis not present

## 2017-09-22 DIAGNOSIS — I10 Essential (primary) hypertension: Secondary | ICD-10-CM | POA: Diagnosis not present

## 2017-09-22 DIAGNOSIS — I350 Nonrheumatic aortic (valve) stenosis: Secondary | ICD-10-CM | POA: Diagnosis not present

## 2017-09-23 DIAGNOSIS — D500 Iron deficiency anemia secondary to blood loss (chronic): Secondary | ICD-10-CM | POA: Diagnosis not present

## 2017-09-23 DIAGNOSIS — I63523 Cerebral infarction due to unspecified occlusion or stenosis of bilateral anterior cerebral arteries: Secondary | ICD-10-CM | POA: Diagnosis not present

## 2017-09-23 DIAGNOSIS — E119 Type 2 diabetes mellitus without complications: Secondary | ICD-10-CM | POA: Diagnosis not present

## 2017-09-23 DIAGNOSIS — E669 Obesity, unspecified: Secondary | ICD-10-CM | POA: Diagnosis not present

## 2017-09-23 DIAGNOSIS — R079 Chest pain, unspecified: Secondary | ICD-10-CM | POA: Diagnosis not present

## 2017-09-23 DIAGNOSIS — I088 Other rheumatic multiple valve diseases: Secondary | ICD-10-CM | POA: Diagnosis not present

## 2017-09-23 DIAGNOSIS — I63211 Cerebral infarction due to unspecified occlusion or stenosis of right vertebral arteries: Secondary | ICD-10-CM | POA: Diagnosis not present

## 2017-09-23 DIAGNOSIS — Z950 Presence of cardiac pacemaker: Secondary | ICD-10-CM | POA: Diagnosis not present

## 2017-09-23 DIAGNOSIS — I452 Bifascicular block: Secondary | ICD-10-CM | POA: Diagnosis not present

## 2017-09-23 DIAGNOSIS — R351 Nocturia: Secondary | ICD-10-CM | POA: Diagnosis not present

## 2017-09-23 DIAGNOSIS — I63439 Cerebral infarction due to embolism of unspecified posterior cerebral artery: Secondary | ICD-10-CM | POA: Diagnosis not present

## 2017-09-23 DIAGNOSIS — N401 Enlarged prostate with lower urinary tract symptoms: Secondary | ICD-10-CM | POA: Diagnosis not present

## 2017-09-23 DIAGNOSIS — I6503 Occlusion and stenosis of bilateral vertebral arteries: Secondary | ICD-10-CM | POA: Diagnosis not present

## 2017-09-23 DIAGNOSIS — I10 Essential (primary) hypertension: Secondary | ICD-10-CM | POA: Diagnosis not present

## 2017-09-23 DIAGNOSIS — I77810 Thoracic aortic ectasia: Secondary | ICD-10-CM | POA: Diagnosis not present

## 2017-09-23 DIAGNOSIS — I350 Nonrheumatic aortic (valve) stenosis: Secondary | ICD-10-CM | POA: Diagnosis not present

## 2017-09-23 DIAGNOSIS — I639 Cerebral infarction, unspecified: Secondary | ICD-10-CM | POA: Diagnosis not present

## 2017-09-23 DIAGNOSIS — I2510 Atherosclerotic heart disease of native coronary artery without angina pectoris: Secondary | ICD-10-CM | POA: Diagnosis not present

## 2017-09-23 DIAGNOSIS — E1165 Type 2 diabetes mellitus with hyperglycemia: Secondary | ICD-10-CM | POA: Diagnosis not present

## 2017-09-23 DIAGNOSIS — D539 Nutritional anemia, unspecified: Secondary | ICD-10-CM | POA: Diagnosis not present

## 2017-09-23 DIAGNOSIS — R42 Dizziness and giddiness: Secondary | ICD-10-CM | POA: Diagnosis not present

## 2017-09-23 DIAGNOSIS — R531 Weakness: Secondary | ICD-10-CM | POA: Diagnosis not present

## 2017-09-23 DIAGNOSIS — K922 Gastrointestinal hemorrhage, unspecified: Secondary | ICD-10-CM | POA: Diagnosis not present

## 2017-09-23 DIAGNOSIS — Z951 Presence of aortocoronary bypass graft: Secondary | ICD-10-CM | POA: Diagnosis not present

## 2017-09-23 DIAGNOSIS — I6523 Occlusion and stenosis of bilateral carotid arteries: Secondary | ICD-10-CM | POA: Diagnosis not present

## 2017-09-23 DIAGNOSIS — D649 Anemia, unspecified: Secondary | ICD-10-CM | POA: Diagnosis not present

## 2017-09-23 DIAGNOSIS — J439 Emphysema, unspecified: Secondary | ICD-10-CM | POA: Diagnosis not present

## 2017-09-23 DIAGNOSIS — I442 Atrioventricular block, complete: Secondary | ICD-10-CM | POA: Diagnosis not present

## 2017-09-24 DIAGNOSIS — I6523 Occlusion and stenosis of bilateral carotid arteries: Secondary | ICD-10-CM | POA: Diagnosis not present

## 2017-09-24 DIAGNOSIS — D539 Nutritional anemia, unspecified: Secondary | ICD-10-CM | POA: Diagnosis not present

## 2017-09-24 DIAGNOSIS — R42 Dizziness and giddiness: Secondary | ICD-10-CM | POA: Diagnosis not present

## 2017-09-24 DIAGNOSIS — I2510 Atherosclerotic heart disease of native coronary artery without angina pectoris: Secondary | ICD-10-CM | POA: Diagnosis not present

## 2017-09-24 DIAGNOSIS — N401 Enlarged prostate with lower urinary tract symptoms: Secondary | ICD-10-CM | POA: Diagnosis not present

## 2017-09-24 DIAGNOSIS — I10 Essential (primary) hypertension: Secondary | ICD-10-CM | POA: Diagnosis not present

## 2017-09-24 DIAGNOSIS — I639 Cerebral infarction, unspecified: Secondary | ICD-10-CM | POA: Diagnosis not present

## 2017-09-24 DIAGNOSIS — I442 Atrioventricular block, complete: Secondary | ICD-10-CM | POA: Diagnosis not present

## 2017-09-24 DIAGNOSIS — I6503 Occlusion and stenosis of bilateral vertebral arteries: Secondary | ICD-10-CM | POA: Diagnosis not present

## 2017-09-24 DIAGNOSIS — E119 Type 2 diabetes mellitus without complications: Secondary | ICD-10-CM | POA: Diagnosis not present

## 2017-09-25 DIAGNOSIS — I452 Bifascicular block: Secondary | ICD-10-CM | POA: Diagnosis not present

## 2017-09-25 DIAGNOSIS — I10 Essential (primary) hypertension: Secondary | ICD-10-CM | POA: Diagnosis not present

## 2017-09-25 DIAGNOSIS — D539 Nutritional anemia, unspecified: Secondary | ICD-10-CM | POA: Diagnosis not present

## 2017-09-25 DIAGNOSIS — I77810 Thoracic aortic ectasia: Secondary | ICD-10-CM | POA: Diagnosis not present

## 2017-09-25 DIAGNOSIS — E119 Type 2 diabetes mellitus without complications: Secondary | ICD-10-CM | POA: Diagnosis not present

## 2017-09-25 DIAGNOSIS — I442 Atrioventricular block, complete: Secondary | ICD-10-CM | POA: Diagnosis not present

## 2017-09-25 DIAGNOSIS — I639 Cerebral infarction, unspecified: Secondary | ICD-10-CM | POA: Diagnosis not present

## 2017-09-25 DIAGNOSIS — I2510 Atherosclerotic heart disease of native coronary artery without angina pectoris: Secondary | ICD-10-CM | POA: Diagnosis not present

## 2017-09-25 DIAGNOSIS — N401 Enlarged prostate with lower urinary tract symptoms: Secondary | ICD-10-CM | POA: Diagnosis not present

## 2017-09-25 DIAGNOSIS — I088 Other rheumatic multiple valve diseases: Secondary | ICD-10-CM | POA: Diagnosis not present

## 2017-09-26 DIAGNOSIS — N401 Enlarged prostate with lower urinary tract symptoms: Secondary | ICD-10-CM | POA: Diagnosis not present

## 2017-09-26 DIAGNOSIS — I10 Essential (primary) hypertension: Secondary | ICD-10-CM | POA: Diagnosis not present

## 2017-09-26 DIAGNOSIS — E119 Type 2 diabetes mellitus without complications: Secondary | ICD-10-CM | POA: Diagnosis not present

## 2017-09-26 DIAGNOSIS — R42 Dizziness and giddiness: Secondary | ICD-10-CM | POA: Diagnosis not present

## 2017-09-26 DIAGNOSIS — I2510 Atherosclerotic heart disease of native coronary artery without angina pectoris: Secondary | ICD-10-CM | POA: Diagnosis not present

## 2017-09-26 DIAGNOSIS — I442 Atrioventricular block, complete: Secondary | ICD-10-CM | POA: Diagnosis not present

## 2017-09-26 DIAGNOSIS — I639 Cerebral infarction, unspecified: Secondary | ICD-10-CM | POA: Diagnosis not present

## 2017-09-26 DIAGNOSIS — I452 Bifascicular block: Secondary | ICD-10-CM | POA: Diagnosis not present

## 2017-09-27 DIAGNOSIS — I452 Bifascicular block: Secondary | ICD-10-CM | POA: Diagnosis not present

## 2017-09-27 DIAGNOSIS — N401 Enlarged prostate with lower urinary tract symptoms: Secondary | ICD-10-CM | POA: Diagnosis not present

## 2017-09-27 DIAGNOSIS — I442 Atrioventricular block, complete: Secondary | ICD-10-CM | POA: Diagnosis not present

## 2017-09-27 DIAGNOSIS — I10 Essential (primary) hypertension: Secondary | ICD-10-CM | POA: Diagnosis not present

## 2017-09-27 DIAGNOSIS — I2510 Atherosclerotic heart disease of native coronary artery without angina pectoris: Secondary | ICD-10-CM | POA: Diagnosis not present

## 2017-09-27 DIAGNOSIS — E119 Type 2 diabetes mellitus without complications: Secondary | ICD-10-CM | POA: Diagnosis not present

## 2017-09-27 DIAGNOSIS — I639 Cerebral infarction, unspecified: Secondary | ICD-10-CM | POA: Diagnosis not present

## 2017-09-28 DIAGNOSIS — Z951 Presence of aortocoronary bypass graft: Secondary | ICD-10-CM | POA: Diagnosis not present

## 2017-09-28 DIAGNOSIS — N401 Enlarged prostate with lower urinary tract symptoms: Secondary | ICD-10-CM | POA: Diagnosis not present

## 2017-09-28 DIAGNOSIS — E119 Type 2 diabetes mellitus without complications: Secondary | ICD-10-CM | POA: Diagnosis not present

## 2017-09-28 DIAGNOSIS — I639 Cerebral infarction, unspecified: Secondary | ICD-10-CM | POA: Diagnosis not present

## 2017-09-28 DIAGNOSIS — Z950 Presence of cardiac pacemaker: Secondary | ICD-10-CM | POA: Diagnosis not present

## 2017-09-28 DIAGNOSIS — I10 Essential (primary) hypertension: Secondary | ICD-10-CM | POA: Diagnosis not present

## 2017-09-28 DIAGNOSIS — I2510 Atherosclerotic heart disease of native coronary artery without angina pectoris: Secondary | ICD-10-CM | POA: Diagnosis not present

## 2017-10-03 DIAGNOSIS — I350 Nonrheumatic aortic (valve) stenosis: Secondary | ICD-10-CM | POA: Diagnosis not present

## 2017-10-03 DIAGNOSIS — I639 Cerebral infarction, unspecified: Secondary | ICD-10-CM | POA: Diagnosis not present

## 2017-10-03 DIAGNOSIS — I10 Essential (primary) hypertension: Secondary | ICD-10-CM | POA: Diagnosis not present

## 2017-10-03 DIAGNOSIS — E119 Type 2 diabetes mellitus without complications: Secondary | ICD-10-CM | POA: Diagnosis not present

## 2017-10-04 DIAGNOSIS — M19011 Primary osteoarthritis, right shoulder: Secondary | ICD-10-CM | POA: Diagnosis not present

## 2017-10-04 DIAGNOSIS — Z950 Presence of cardiac pacemaker: Secondary | ICD-10-CM | POA: Diagnosis not present

## 2017-10-04 DIAGNOSIS — T82897A Other specified complication of cardiac prosthetic devices, implants and grafts, initial encounter: Secondary | ICD-10-CM | POA: Diagnosis not present

## 2017-10-04 DIAGNOSIS — M79631 Pain in right forearm: Secondary | ICD-10-CM | POA: Diagnosis not present

## 2017-10-04 DIAGNOSIS — M1991 Primary osteoarthritis, unspecified site: Secondary | ICD-10-CM | POA: Diagnosis not present

## 2017-10-04 DIAGNOSIS — I82A12 Acute embolism and thrombosis of left axillary vein: Secondary | ICD-10-CM | POA: Diagnosis not present

## 2017-10-04 DIAGNOSIS — Z951 Presence of aortocoronary bypass graft: Secondary | ICD-10-CM | POA: Diagnosis not present

## 2017-10-04 DIAGNOSIS — I672 Cerebral atherosclerosis: Secondary | ICD-10-CM | POA: Diagnosis not present

## 2017-10-04 DIAGNOSIS — M25421 Effusion, right elbow: Secondary | ICD-10-CM | POA: Diagnosis not present

## 2017-10-04 DIAGNOSIS — M70811 Other soft tissue disorders related to use, overuse and pressure, right shoulder: Secondary | ICD-10-CM | POA: Diagnosis not present

## 2017-10-04 DIAGNOSIS — R911 Solitary pulmonary nodule: Secondary | ICD-10-CM | POA: Diagnosis not present

## 2017-10-04 DIAGNOSIS — M25429 Effusion, unspecified elbow: Secondary | ICD-10-CM | POA: Diagnosis not present

## 2017-10-04 DIAGNOSIS — T82897D Other specified complication of cardiac prosthetic devices, implants and grafts, subsequent encounter: Secondary | ICD-10-CM | POA: Diagnosis not present

## 2017-10-04 DIAGNOSIS — E1169 Type 2 diabetes mellitus with other specified complication: Secondary | ICD-10-CM | POA: Diagnosis not present

## 2017-10-04 DIAGNOSIS — Z9842 Cataract extraction status, left eye: Secondary | ICD-10-CM | POA: Diagnosis not present

## 2017-10-04 DIAGNOSIS — R600 Localized edema: Secondary | ICD-10-CM | POA: Diagnosis not present

## 2017-10-04 DIAGNOSIS — T82837A Hemorrhage of cardiac prosthetic devices, implants and grafts, initial encounter: Secondary | ICD-10-CM | POA: Diagnosis not present

## 2017-10-04 DIAGNOSIS — E785 Hyperlipidemia, unspecified: Secondary | ICD-10-CM | POA: Diagnosis not present

## 2017-10-04 DIAGNOSIS — M84421A Pathological fracture, right humerus, initial encounter for fracture: Secondary | ICD-10-CM | POA: Diagnosis not present

## 2017-10-04 DIAGNOSIS — E119 Type 2 diabetes mellitus without complications: Secondary | ICD-10-CM | POA: Diagnosis not present

## 2017-10-04 DIAGNOSIS — M7989 Other specified soft tissue disorders: Secondary | ICD-10-CM | POA: Diagnosis not present

## 2017-10-04 DIAGNOSIS — M79602 Pain in left arm: Secondary | ICD-10-CM | POA: Diagnosis not present

## 2017-10-04 DIAGNOSIS — I6350 Cerebral infarction due to unspecified occlusion or stenosis of unspecified cerebral artery: Secondary | ICD-10-CM | POA: Diagnosis not present

## 2017-10-04 DIAGNOSIS — M79601 Pain in right arm: Secondary | ICD-10-CM | POA: Diagnosis not present

## 2017-10-04 DIAGNOSIS — I6319 Cerebral infarction due to embolism of other precerebral artery: Secondary | ICD-10-CM | POA: Diagnosis not present

## 2017-10-04 DIAGNOSIS — I82622 Acute embolism and thrombosis of deep veins of left upper extremity: Secondary | ICD-10-CM | POA: Diagnosis not present

## 2017-10-04 DIAGNOSIS — M25511 Pain in right shoulder: Secondary | ICD-10-CM | POA: Diagnosis not present

## 2017-10-04 DIAGNOSIS — N400 Enlarged prostate without lower urinary tract symptoms: Secondary | ICD-10-CM | POA: Diagnosis not present

## 2017-10-04 DIAGNOSIS — J439 Emphysema, unspecified: Secondary | ICD-10-CM | POA: Diagnosis not present

## 2017-10-04 DIAGNOSIS — I639 Cerebral infarction, unspecified: Secondary | ICD-10-CM | POA: Diagnosis not present

## 2017-10-04 DIAGNOSIS — R0602 Shortness of breath: Secondary | ICD-10-CM | POA: Diagnosis not present

## 2017-10-04 DIAGNOSIS — I82B12 Acute embolism and thrombosis of left subclavian vein: Secondary | ICD-10-CM | POA: Diagnosis not present

## 2017-10-04 DIAGNOSIS — I350 Nonrheumatic aortic (valve) stenosis: Secondary | ICD-10-CM | POA: Diagnosis not present

## 2017-10-04 DIAGNOSIS — E1165 Type 2 diabetes mellitus with hyperglycemia: Secondary | ICD-10-CM | POA: Diagnosis not present

## 2017-10-04 DIAGNOSIS — Z8673 Personal history of transient ischemic attack (TIA), and cerebral infarction without residual deficits: Secondary | ICD-10-CM | POA: Diagnosis not present

## 2017-10-04 DIAGNOSIS — S42401A Unspecified fracture of lower end of right humerus, initial encounter for closed fracture: Secondary | ICD-10-CM | POA: Diagnosis not present

## 2017-10-04 DIAGNOSIS — M79622 Pain in left upper arm: Secondary | ICD-10-CM | POA: Diagnosis not present

## 2017-10-04 DIAGNOSIS — I2510 Atherosclerotic heart disease of native coronary artery without angina pectoris: Secondary | ICD-10-CM | POA: Diagnosis not present

## 2017-10-04 DIAGNOSIS — I10 Essential (primary) hypertension: Secondary | ICD-10-CM | POA: Diagnosis not present

## 2017-10-04 DIAGNOSIS — I442 Atrioventricular block, complete: Secondary | ICD-10-CM | POA: Diagnosis not present

## 2017-10-04 DIAGNOSIS — I6389 Other cerebral infarction: Secondary | ICD-10-CM | POA: Diagnosis not present

## 2017-10-04 DIAGNOSIS — S42401D Unspecified fracture of lower end of right humerus, subsequent encounter for fracture with routine healing: Secondary | ICD-10-CM | POA: Diagnosis not present

## 2017-10-05 DIAGNOSIS — R600 Localized edema: Secondary | ICD-10-CM | POA: Diagnosis not present

## 2017-10-05 DIAGNOSIS — I82622 Acute embolism and thrombosis of deep veins of left upper extremity: Secondary | ICD-10-CM | POA: Diagnosis not present

## 2017-10-05 DIAGNOSIS — I6389 Other cerebral infarction: Secondary | ICD-10-CM | POA: Diagnosis not present

## 2017-10-05 DIAGNOSIS — I10 Essential (primary) hypertension: Secondary | ICD-10-CM | POA: Diagnosis not present

## 2017-10-05 DIAGNOSIS — I6350 Cerebral infarction due to unspecified occlusion or stenosis of unspecified cerebral artery: Secondary | ICD-10-CM | POA: Diagnosis not present

## 2017-10-05 DIAGNOSIS — I82A12 Acute embolism and thrombosis of left axillary vein: Secondary | ICD-10-CM | POA: Diagnosis not present

## 2017-10-05 DIAGNOSIS — I2510 Atherosclerotic heart disease of native coronary artery without angina pectoris: Secondary | ICD-10-CM | POA: Diagnosis not present

## 2017-10-05 DIAGNOSIS — I672 Cerebral atherosclerosis: Secondary | ICD-10-CM | POA: Diagnosis not present

## 2017-10-05 DIAGNOSIS — I350 Nonrheumatic aortic (valve) stenosis: Secondary | ICD-10-CM | POA: Diagnosis not present

## 2017-10-05 DIAGNOSIS — T82897A Other specified complication of cardiac prosthetic devices, implants and grafts, initial encounter: Secondary | ICD-10-CM | POA: Diagnosis not present

## 2017-10-06 DIAGNOSIS — M1991 Primary osteoarthritis, unspecified site: Secondary | ICD-10-CM | POA: Diagnosis not present

## 2017-10-06 DIAGNOSIS — M25511 Pain in right shoulder: Secondary | ICD-10-CM | POA: Diagnosis not present

## 2017-10-06 DIAGNOSIS — M19011 Primary osteoarthritis, right shoulder: Secondary | ICD-10-CM | POA: Diagnosis not present

## 2017-10-06 DIAGNOSIS — E1169 Type 2 diabetes mellitus with other specified complication: Secondary | ICD-10-CM | POA: Diagnosis not present

## 2017-10-06 DIAGNOSIS — M25421 Effusion, right elbow: Secondary | ICD-10-CM | POA: Diagnosis not present

## 2017-10-06 DIAGNOSIS — I6319 Cerebral infarction due to embolism of other precerebral artery: Secondary | ICD-10-CM | POA: Diagnosis not present

## 2017-10-06 DIAGNOSIS — M25429 Effusion, unspecified elbow: Secondary | ICD-10-CM | POA: Diagnosis not present

## 2017-10-06 DIAGNOSIS — M79631 Pain in right forearm: Secondary | ICD-10-CM | POA: Diagnosis not present

## 2017-10-06 DIAGNOSIS — I10 Essential (primary) hypertension: Secondary | ICD-10-CM | POA: Diagnosis not present

## 2017-10-06 DIAGNOSIS — M70811 Other soft tissue disorders related to use, overuse and pressure, right shoulder: Secondary | ICD-10-CM | POA: Diagnosis not present

## 2017-10-06 DIAGNOSIS — M79601 Pain in right arm: Secondary | ICD-10-CM | POA: Diagnosis not present

## 2017-10-06 DIAGNOSIS — I82A12 Acute embolism and thrombosis of left axillary vein: Secondary | ICD-10-CM | POA: Diagnosis not present

## 2017-10-06 DIAGNOSIS — M7989 Other specified soft tissue disorders: Secondary | ICD-10-CM | POA: Diagnosis not present

## 2017-10-07 DIAGNOSIS — I82A12 Acute embolism and thrombosis of left axillary vein: Secondary | ICD-10-CM | POA: Diagnosis not present

## 2017-10-07 DIAGNOSIS — E1169 Type 2 diabetes mellitus with other specified complication: Secondary | ICD-10-CM | POA: Diagnosis not present

## 2017-10-07 DIAGNOSIS — I10 Essential (primary) hypertension: Secondary | ICD-10-CM | POA: Diagnosis not present

## 2017-10-08 DIAGNOSIS — R600 Localized edema: Secondary | ICD-10-CM | POA: Diagnosis not present

## 2017-10-08 DIAGNOSIS — I82A12 Acute embolism and thrombosis of left axillary vein: Secondary | ICD-10-CM | POA: Diagnosis not present

## 2017-10-08 DIAGNOSIS — I10 Essential (primary) hypertension: Secondary | ICD-10-CM | POA: Diagnosis not present

## 2017-10-08 DIAGNOSIS — M79622 Pain in left upper arm: Secondary | ICD-10-CM | POA: Diagnosis not present

## 2017-10-08 DIAGNOSIS — E1169 Type 2 diabetes mellitus with other specified complication: Secondary | ICD-10-CM | POA: Diagnosis not present

## 2017-10-09 DIAGNOSIS — T82837A Hemorrhage of cardiac prosthetic devices, implants and grafts, initial encounter: Secondary | ICD-10-CM | POA: Diagnosis not present

## 2017-10-09 DIAGNOSIS — S42401A Unspecified fracture of lower end of right humerus, initial encounter for closed fracture: Secondary | ICD-10-CM | POA: Diagnosis not present

## 2017-10-09 DIAGNOSIS — I350 Nonrheumatic aortic (valve) stenosis: Secondary | ICD-10-CM | POA: Diagnosis not present

## 2017-10-09 DIAGNOSIS — I82A12 Acute embolism and thrombosis of left axillary vein: Secondary | ICD-10-CM | POA: Diagnosis not present

## 2017-10-10 DIAGNOSIS — T82837A Hemorrhage of cardiac prosthetic devices, implants and grafts, initial encounter: Secondary | ICD-10-CM | POA: Diagnosis not present

## 2017-10-10 DIAGNOSIS — I350 Nonrheumatic aortic (valve) stenosis: Secondary | ICD-10-CM | POA: Diagnosis not present

## 2017-10-10 DIAGNOSIS — S42401A Unspecified fracture of lower end of right humerus, initial encounter for closed fracture: Secondary | ICD-10-CM | POA: Diagnosis not present

## 2017-10-10 DIAGNOSIS — I82A12 Acute embolism and thrombosis of left axillary vein: Secondary | ICD-10-CM | POA: Diagnosis not present

## 2017-10-11 DIAGNOSIS — T82837A Hemorrhage of cardiac prosthetic devices, implants and grafts, initial encounter: Secondary | ICD-10-CM | POA: Diagnosis not present

## 2017-10-11 DIAGNOSIS — I442 Atrioventricular block, complete: Secondary | ICD-10-CM | POA: Diagnosis not present

## 2017-10-11 DIAGNOSIS — S42401A Unspecified fracture of lower end of right humerus, initial encounter for closed fracture: Secondary | ICD-10-CM | POA: Diagnosis not present

## 2017-10-11 DIAGNOSIS — I350 Nonrheumatic aortic (valve) stenosis: Secondary | ICD-10-CM | POA: Diagnosis not present

## 2017-10-11 DIAGNOSIS — S42401D Unspecified fracture of lower end of right humerus, subsequent encounter for fracture with routine healing: Secondary | ICD-10-CM | POA: Diagnosis not present

## 2017-10-11 DIAGNOSIS — I10 Essential (primary) hypertension: Secondary | ICD-10-CM | POA: Diagnosis not present

## 2017-10-11 DIAGNOSIS — I2510 Atherosclerotic heart disease of native coronary artery without angina pectoris: Secondary | ICD-10-CM | POA: Diagnosis not present

## 2017-10-11 DIAGNOSIS — I82622 Acute embolism and thrombosis of deep veins of left upper extremity: Secondary | ICD-10-CM | POA: Diagnosis not present

## 2017-10-11 DIAGNOSIS — Z950 Presence of cardiac pacemaker: Secondary | ICD-10-CM | POA: Diagnosis not present

## 2017-10-11 DIAGNOSIS — T82897D Other specified complication of cardiac prosthetic devices, implants and grafts, subsequent encounter: Secondary | ICD-10-CM | POA: Diagnosis not present

## 2017-10-11 DIAGNOSIS — R260 Ataxic gait: Secondary | ICD-10-CM | POA: Diagnosis not present

## 2017-10-11 DIAGNOSIS — M25521 Pain in right elbow: Secondary | ICD-10-CM | POA: Diagnosis not present

## 2017-10-11 DIAGNOSIS — M19021 Primary osteoarthritis, right elbow: Secondary | ICD-10-CM | POA: Diagnosis not present

## 2017-10-11 DIAGNOSIS — I82A12 Acute embolism and thrombosis of left axillary vein: Secondary | ICD-10-CM | POA: Diagnosis not present

## 2017-10-11 DIAGNOSIS — E785 Hyperlipidemia, unspecified: Secondary | ICD-10-CM | POA: Diagnosis not present

## 2017-10-11 DIAGNOSIS — I6389 Other cerebral infarction: Secondary | ICD-10-CM | POA: Diagnosis not present

## 2017-10-11 DIAGNOSIS — E119 Type 2 diabetes mellitus without complications: Secondary | ICD-10-CM | POA: Diagnosis not present

## 2017-10-11 DIAGNOSIS — I639 Cerebral infarction, unspecified: Secondary | ICD-10-CM | POA: Diagnosis not present

## 2017-10-11 DIAGNOSIS — N400 Enlarged prostate without lower urinary tract symptoms: Secondary | ICD-10-CM | POA: Diagnosis not present

## 2017-10-12 DIAGNOSIS — I82622 Acute embolism and thrombosis of deep veins of left upper extremity: Secondary | ICD-10-CM | POA: Diagnosis not present

## 2017-10-12 DIAGNOSIS — I2510 Atherosclerotic heart disease of native coronary artery without angina pectoris: Secondary | ICD-10-CM | POA: Diagnosis not present

## 2017-10-12 DIAGNOSIS — I639 Cerebral infarction, unspecified: Secondary | ICD-10-CM | POA: Diagnosis not present

## 2017-10-12 DIAGNOSIS — I442 Atrioventricular block, complete: Secondary | ICD-10-CM | POA: Diagnosis not present

## 2017-10-23 DIAGNOSIS — I6389 Other cerebral infarction: Secondary | ICD-10-CM | POA: Diagnosis not present

## 2017-10-23 DIAGNOSIS — M19021 Primary osteoarthritis, right elbow: Secondary | ICD-10-CM | POA: Diagnosis not present

## 2017-10-23 DIAGNOSIS — I82622 Acute embolism and thrombosis of deep veins of left upper extremity: Secondary | ICD-10-CM | POA: Diagnosis not present

## 2017-10-23 DIAGNOSIS — I350 Nonrheumatic aortic (valve) stenosis: Secondary | ICD-10-CM | POA: Diagnosis not present

## 2017-10-23 DIAGNOSIS — M25521 Pain in right elbow: Secondary | ICD-10-CM | POA: Diagnosis not present

## 2017-10-23 DIAGNOSIS — I442 Atrioventricular block, complete: Secondary | ICD-10-CM | POA: Diagnosis not present

## 2017-10-24 DIAGNOSIS — R260 Ataxic gait: Secondary | ICD-10-CM | POA: Diagnosis not present

## 2017-11-02 DIAGNOSIS — N3941 Urge incontinence: Secondary | ICD-10-CM | POA: Diagnosis not present

## 2017-11-03 DIAGNOSIS — R260 Ataxic gait: Secondary | ICD-10-CM | POA: Diagnosis not present

## 2017-11-04 ENCOUNTER — Other Ambulatory Visit: Payer: Self-pay | Admitting: Interventional Cardiology

## 2017-11-06 ENCOUNTER — Other Ambulatory Visit: Payer: Self-pay | Admitting: Interventional Cardiology

## 2017-11-08 DIAGNOSIS — Z45010 Encounter for checking and testing of cardiac pacemaker pulse generator [battery]: Secondary | ICD-10-CM | POA: Diagnosis not present

## 2017-11-08 DIAGNOSIS — Z45018 Encounter for adjustment and management of other part of cardiac pacemaker: Secondary | ICD-10-CM | POA: Diagnosis not present

## 2017-11-14 DIAGNOSIS — M25521 Pain in right elbow: Secondary | ICD-10-CM | POA: Diagnosis not present

## 2017-11-14 DIAGNOSIS — I2510 Atherosclerotic heart disease of native coronary artery without angina pectoris: Secondary | ICD-10-CM | POA: Diagnosis not present

## 2017-11-14 DIAGNOSIS — S42401D Unspecified fracture of lower end of right humerus, subsequent encounter for fracture with routine healing: Secondary | ICD-10-CM | POA: Diagnosis not present

## 2017-11-15 DIAGNOSIS — I2510 Atherosclerotic heart disease of native coronary artery without angina pectoris: Secondary | ICD-10-CM | POA: Diagnosis not present

## 2017-11-15 DIAGNOSIS — K5901 Slow transit constipation: Secondary | ICD-10-CM | POA: Diagnosis not present

## 2017-11-16 DIAGNOSIS — I10 Essential (primary) hypertension: Secondary | ICD-10-CM | POA: Diagnosis not present

## 2017-11-16 DIAGNOSIS — I350 Nonrheumatic aortic (valve) stenosis: Secondary | ICD-10-CM | POA: Diagnosis not present

## 2017-11-16 DIAGNOSIS — I2510 Atherosclerotic heart disease of native coronary artery without angina pectoris: Secondary | ICD-10-CM | POA: Diagnosis not present

## 2017-11-16 DIAGNOSIS — I442 Atrioventricular block, complete: Secondary | ICD-10-CM | POA: Diagnosis not present

## 2017-11-16 DIAGNOSIS — K5900 Constipation, unspecified: Secondary | ICD-10-CM | POA: Diagnosis not present

## 2017-11-16 DIAGNOSIS — R109 Unspecified abdominal pain: Secondary | ICD-10-CM | POA: Diagnosis not present

## 2017-11-17 DIAGNOSIS — R260 Ataxic gait: Secondary | ICD-10-CM | POA: Diagnosis not present

## 2017-11-20 DIAGNOSIS — R260 Ataxic gait: Secondary | ICD-10-CM | POA: Diagnosis not present

## 2017-11-21 DIAGNOSIS — N3941 Urge incontinence: Secondary | ICD-10-CM | POA: Diagnosis not present

## 2017-11-23 DIAGNOSIS — R260 Ataxic gait: Secondary | ICD-10-CM | POA: Diagnosis not present

## 2017-11-23 NOTE — Progress Notes (Signed)
Cardiology Office Note    Date:  11/24/2017   ID:  Gregory Barry, DOB 03-25-1921, MRN [id number]  PCP:  Lesia Hausen, MD  Cardiologist: Lesleigh Noe, MD   Chief Complaint  Patient presents with  . Coronary Artery Disease  . Pacemaker Problem    History of Present Illness:  Gregory Barry is a 84 y.o. male CAD, hypertension, Aortic stenosis, previous coronary bypass grafting, hypertension, and chronic recurring episodes of dizziness.  Recent transient ischemic attacks.  Since last seen, he has had a stroke.  2 areas of infarction were noted.  This is impacted balance and mobility.  Also while hospitalized he was noted to be in high-grade AV block, resulting in permanent pacemaker implantation.  This is likely related to calcific encroachment on the AV node conduction system in the setting of calcific aortic stenosis.  The pacemaker was placed by Dr. Isabell Jarvis at [facility].  He does have exertional dyspnea.  He denies angina.  Dyspnea has not significantly changed compared to our last visit.   Past Medical History:  Diagnosis Date  . Allergic rhinitis   . Amnesia, global, transient    Episode was very brief & not associated with other neurlogical findings.  . Balance problem   . BPH (benign prostatic hyperplasia)   . CAD (coronary artery disease)   . Calculus of kidney   . Cataract   . Chronic lower back pain   . Constipated   . Coronary atherosclerosis    With sequential SVG to D#1 and #2, sequential SVG to OM #1 and #2, SVG to PDA, and LIMA to LAD. Patent grafts 2205 and no ischemia by Nuc 09/2009 [name])  . Discoid lupus   . Dizziness    With standing but could not document orthostasis today  . Essential hypertension, benign    followed by Dr. Mendel Ryder - last seen late March, 2016, he wishes for the pt. to have further testing prior to clearing for surgery   . Fungal infection of toenail   . Hearing loss   . Insomnia, unspecified   . Meralgia paresthetica 1997  . Mixed  hyperlipidemia   . Osteoarthrosis, unspecified whether generalized or localized, lower leg    knees, back    . Overweight(278.02)   . Periarthritis 2001   Left shoulder  . Pneumonia    "when I was a child"  . Polyneuropathy in diabetes(357.2)   . Stroke (HCC) 01/2011   R BG CVA on MRI, MRA occluded R vertebral artery  . Triggering of finger    Left thumb and right fourth finger  . Type II diabetes mellitus (HCC)     Past Surgical History:  Procedure Laterality Date  . BACK SURGERY    . CORONARY ANGIOPLASTY WITH STENT PLACEMENT     With sequential SVG to D#1 and #2, sequential SVG to OM #1 and #2, SVG to PDA, and LIMA to LAD. Patent grafts 2205 and no ischemia by Nuc 09/2009 [name])  . CORONARY ARTERY BYPASS GRAFT  1997   "CABG X5"  . HEMILAMINOTOMY LUMBAR SPINE  02/12/2015   Decompressive lumbar hemilaminectomy, medial facetectomy, foraminotomies at L1-2 and L2-3 on the left followed by sublaminar decompression  . INGUINAL HERNIA REPAIR Right 1997  . JOINT REPLACEMENT    . LESION REMOVAL     Benign lesions  . LITHOTRIPSY     [name]  . LUMBAR LAMINECTOMY  2006   "[name]"  . LUMBAR LAMINECTOMY/DECOMPRESSION MICRODISCECTOMY N/A  02/12/2015   Procedure: Laminectomy - Lumbar one -two lumbar two -three;  Surgeon: Tia Alertavid S Jones, MD;  Location: [facility];  Service: Neurosurgery;  Laterality: N/A;  . TONSILLECTOMY    . TOTAL KNEE ARTHROPLASTY Left ~ 2005   [name]  . TOTAL KNEE ARTHROPLASTY Right 07/29/11    Current Medications: Outpatient Medications Prior to Visit  Medication Sig Dispense Refill  . amoxicillin (AMOXIL) 500 MG tablet Take 4 tablets by mouth as needed. Take prior to dental procedures.    Marland Kitchen. aspirin EC 81 MG tablet Take 81 mg by mouth daily.     Marland Kitchen. atorvastatin (LIPITOR) 40 MG tablet Take 40 mg by mouth daily.    . Calcium Carbonate-Vitamin D (CALTRATE 600+D PO) Take 1 tablet by mouth daily.    . cholecalciferol (VITAMIN D) 1000 UNITS tablet Take 1,000 Units  by mouth daily.    . Coenzyme Q-10 200 MG CAPS Take 200 mg by mouth daily.    . finasteride (PROSCAR) 5 MG tablet Take 5 mg by mouth daily.    Marland Kitchen. FREESTYLE LITE test strip 1 strip by Other route 4 (four) times daily. Use 1 strip to check glucose four times a day  0  . hydroxypropyl methylcellulose (ISOPTO TEARS) 2.5 % ophthalmic solution Place 1 drop into both eyes 4 (four) times daily.     . metFORMIN (GLUCOPHAGE) 500 MG tablet Take 500 mg by mouth 2 (two) times daily with a meal.    . metoprolol succinate (TOPROL-XL) 25 MG 24 hr tablet Take 1 tablet (25 mg total) by mouth daily. Patient overdue for an appointment an needs to call and schedule for further refills 1st attempt 30 tablet 0  . mirabegron ER (MYRBETRIQ) 50 MG TB24 tablet Take 50 mg by mouth daily.    . nitroGLYCERIN (NITROSTAT) 0.4 MG SL tablet Place 0.4 mg under the tongue every 5 (five) minutes as needed for chest pain (3 DOSES MAX).    . Omega-3 Fatty Acids (FISH OIL) 1200 MG CAPS Take 1 capsule by mouth 2 (two) times daily.    . pioglitazone (ACTOS) 15 MG tablet Take 15 mg by mouth 2 (two) times daily.    . polyethylene glycol (MIRALAX / GLYCOLAX) packet Take 17 g by mouth at bedtime as needed for mild constipation.     . tamsulosin (FLOMAX) 0.4 MG CAPS capsule Take 0.4 mg by mouth 2 (two) times daily.     . vitamin B-12 (CYANOCOBALAMIN) 1000 MCG tablet Take 1,000 mcg by mouth daily.    Carlena Hurl. XARELTO 20 MG TABS tablet Take 20 mg by mouth daily.  1  . fluocinonide cream (LIDEX) 0.05 % Apply 1 application topically as needed (contact dermatitis).    . pravastatin (PRAVACHOL) 20 MG tablet Take 20 mg by mouth daily.     No facility-administered medications prior to visit.      Allergies:   Simvastatin and Sporanos [itraconazole]   Social History   Socioeconomic History  . Marital status: Married    Spouse name: None  . Number of children: None  . Years of education: None  . Highest education level: None  Social Needs  .  Financial resource strain: None  . Food insecurity - worry: None  . Food insecurity - inability: None  . Transportation needs - medical: None  . Transportation needs - non-medical: None  Occupational History  . None  Tobacco Use  . Smoking status: Former Smoker    Packs/day: 1.00    Years:  20.00    Pack years: 20.00    Types: Cigarettes  . Smokeless tobacco: Never Used  . Tobacco comment: "quit smoking cigarettes in 1964"  Substance and Sexual Activity  . Alcohol use: Yes    Alcohol/week: 3.0 oz    Types: 5 Shots of liquor per week    Comment: 02/12/2015 "one, 1 shot cocktail before dinner 5 nights/week"  . Drug use: No  . Sexual activity: No  Other Topics Concern  . None  Social History Narrative  . None     Family History:  The patient's family history includes Cancer in his maternal grandmother; Congestive Heart Failure in his father and mother; Down syndrome in his brother; Heart failure in his father and other; Hypertension in his mother; Pneumonia in his brother; Stroke in his mother and other; Thrombosis in his mother.   ROS:   Please see the history of present illness.    Hearing loss, excessive fatigue, dyspnea on exertion, back pain, dizziness, balance issues, difficulty urinating.  No falls.  No head trauma. All other systems reviewed and are negative.   PHYSICAL EXAM:   VS:  BP 116/60   Pulse 60   Ht 5\' 7"  (1.702 m)   Wt 170 lb 3.2 oz (77.2 kg)   BMI 26.66 kg/m    GEN: Well nourished, well developed, in no acute distress  HEENT: normal  Neck: no JVD, carotid bruits, or masses Cardiac: RRR; no murmurs, rubs, or gallops,no edema  Respiratory:  clear to auscultation bilaterally, normal work of breathing GI: soft, nontender, nondistended, + BS MS: no deformity or atrophy  Skin: warm and dry, no rash Neuro:  Alert and Oriented x 3, Strength and sensation are intact Psych: euthymic mood, full affect  Wt Readings from Last 3 Encounters:  11/24/17 170 lb 3.2  oz (77.2 kg)  09/01/16 173 lb (78.5 kg)  05/18/16 169 lb 12.8 oz (77 kg)      Studies/Labs Reviewed:   EKG:  EKG sinus bradycardia, no evidence of pacing.  Inferior infarct, old.  Left axis deviation.  When compared to prior tracings from August 2017, no change has occurred.  Recent Labs: No results found for requested labs within last 8760 hours.   Lipid Panel No results found for: CHOL, TRIG, HDL, CHOLHDL, VLDL, LDLCALC, LDLDIRECT  Additional studies/ records that were reviewed today include:  Transesophageal echocardiogram September 25, 2017 There is normal left ventricular wall thickness. The left ventricular wall motion is normal. The left ventricular ejection fraction is normal (55-60%). There is moderate mitral regurgitation (2+). The left atrium is mildly dilated. There is no thrombus seen in the appendage. The aortic valve leaflets are sclerotic and calcified and motion is restricted. There is mild tricuspid regurgitation (1+). There is moderate aortic stenosis (aortic valve area = 1.0-1.5cm2). Injection of contrast documented no interatrial shunt. There is moderate atherosclerosis of the thoracic aorta. The right ventricular ejection fraction is grossly normal. Approximately 1 x 0.5 cm mobile echodensity associated with the chordal apparatus just below the posterior mitral valve leaflet. Differential includes, but is not limited to an old vegetation/healed endocarditis, intracardiac tumor, or a normal variant of a senile calcification. Clinical correlation recommended.  Above procedure was done in the setting of embolic versus ischemic microvascular CVA at no font.   ASSESSMENT:    1. Coronary artery disease involving coronary bypass graft of native heart without angina pectoris   2. Chronic diastolic heart failure (HCC)   3. Essential hypertension  4. Moderate calcific aortic stenosis   5. Complete heart block (HCC)   6. Mixed hyperlipidemia      PLAN:    In order of problems listed above:  1. Dyspnea is likely an anginal equivalent.  At his age medical therapy is recommended. 2. Continue beta-blocker therapy.  Use diuretic therapy if he develops volume overload. 3. Relatively low blood pressures.  This limits ability to manage diastolic heart failure with diuretics. 4. Transesophageal echo did not suggest severe left ear.  Continue to follow. 5. Likely related to calcific encroachment on the AV node in the setting of aortic stenosis.  Dr. Isabell Jarvis will be the EP following the device.  Current EKG without evidence of pacing.  Event monitor done over 30 days in 2017 did not reveal any significant bradycardia or heart block.   Clinical follow-up in 6-9 months.  No change.  [place] in [place].  They will keep management of pacemaker with Dr. Isabell Jarvis.  We will keep general cardiology here.  Review of multiple records was required.  Everything available within Care Everywhere was reviewed in preparation for today's visit.  Greater than 50% of the time spent was in counseling and record review.  Coordination of care was accomplished.  Medication Adjustments/Labs and Tests Ordered: Current medicines are reviewed at length with the patient today.  Concerns regarding medicines are outlined above.  Medication changes, Labs and Tests ordered today are listed in the Patient Instructions below. Patient Instructions  Medication Instructions:  Your physician recommends that you continue on your current medications as directed. Please refer to the Current Medication list given to you today.  Labwork: None  Testing/Procedures: None  Follow-Up: Your physician wants you to follow-up in: 6-8 months with Dr. [name].  You will receive a reminder letter in the mail two months in advance. If you don't receive a letter, please call our office to schedule the follow-up appointment.   Any Other Special Instructions Will Be Listed Below (If  Applicable).     If you need a refill on your cardiac medications before your next appointment, please call your pharmacy.      Signed, Lesleigh Noe, MD  11/24/2017 12:41 PM    [facility] [address] [place], [place], [place]  [address] Phone: [phone]; Fax: [phone]

## 2017-11-24 ENCOUNTER — Ambulatory Visit: Payer: Medicare Other | Admitting: Interventional Cardiology

## 2017-11-24 ENCOUNTER — Encounter: Payer: Self-pay | Admitting: Interventional Cardiology

## 2017-11-24 VITALS — BP 116/60 | HR 60 | Ht 67.0 in | Wt 170.2 lb

## 2017-11-24 DIAGNOSIS — I350 Nonrheumatic aortic (valve) stenosis: Secondary | ICD-10-CM

## 2017-11-24 DIAGNOSIS — I5032 Chronic diastolic (congestive) heart failure: Secondary | ICD-10-CM

## 2017-11-24 DIAGNOSIS — I10 Essential (primary) hypertension: Secondary | ICD-10-CM | POA: Diagnosis not present

## 2017-11-24 DIAGNOSIS — I25810 Atherosclerosis of coronary artery bypass graft(s) without angina pectoris: Secondary | ICD-10-CM | POA: Diagnosis not present

## 2017-11-24 DIAGNOSIS — E782 Mixed hyperlipidemia: Secondary | ICD-10-CM

## 2017-11-24 DIAGNOSIS — I442 Atrioventricular block, complete: Secondary | ICD-10-CM

## 2017-11-24 NOTE — Patient Instructions (Signed)
Medication Instructions:  Your physician recommends that you continue on your current medications as directed. Please refer to the Current Medication list given to you today.  Labwork: None  Testing/Procedures: None  Follow-Up: Your physician wants you to follow-up in: 6-8 months with Dr. Smith.  You will receive a reminder letter in the mail two months in advance. If you don't receive a letter, please call our office to schedule the follow-up appointment.   Any Other Special Instructions Will Be Listed Below (If Applicable).     If you need a refill on your cardiac medications before your next appointment, please call your pharmacy.   

## 2017-11-27 DIAGNOSIS — E1142 Type 2 diabetes mellitus with diabetic polyneuropathy: Secondary | ICD-10-CM | POA: Diagnosis not present

## 2017-11-27 DIAGNOSIS — R260 Ataxic gait: Secondary | ICD-10-CM | POA: Diagnosis not present

## 2017-11-30 DIAGNOSIS — R260 Ataxic gait: Secondary | ICD-10-CM | POA: Diagnosis not present

## 2017-12-01 DIAGNOSIS — D649 Anemia, unspecified: Secondary | ICD-10-CM | POA: Diagnosis not present

## 2017-12-01 DIAGNOSIS — D539 Nutritional anemia, unspecified: Secondary | ICD-10-CM | POA: Diagnosis not present

## 2017-12-01 DIAGNOSIS — Z7901 Long term (current) use of anticoagulants: Secondary | ICD-10-CM | POA: Diagnosis not present

## 2017-12-01 DIAGNOSIS — Z5181 Encounter for therapeutic drug level monitoring: Secondary | ICD-10-CM | POA: Diagnosis not present

## 2017-12-01 DIAGNOSIS — I82622 Acute embolism and thrombosis of deep veins of left upper extremity: Secondary | ICD-10-CM | POA: Diagnosis not present

## 2017-12-04 DIAGNOSIS — I350 Nonrheumatic aortic (valve) stenosis: Secondary | ICD-10-CM | POA: Diagnosis not present

## 2017-12-04 DIAGNOSIS — I2510 Atherosclerotic heart disease of native coronary artery without angina pectoris: Secondary | ICD-10-CM | POA: Diagnosis not present

## 2017-12-04 DIAGNOSIS — I10 Essential (primary) hypertension: Secondary | ICD-10-CM | POA: Diagnosis not present

## 2017-12-04 DIAGNOSIS — R0602 Shortness of breath: Secondary | ICD-10-CM | POA: Diagnosis not present

## 2017-12-04 DIAGNOSIS — I82A12 Acute embolism and thrombosis of left axillary vein: Secondary | ICD-10-CM | POA: Diagnosis not present

## 2017-12-06 DIAGNOSIS — R260 Ataxic gait: Secondary | ICD-10-CM | POA: Diagnosis not present

## 2017-12-07 DIAGNOSIS — R260 Ataxic gait: Secondary | ICD-10-CM | POA: Diagnosis not present

## 2017-12-08 DIAGNOSIS — I82622 Acute embolism and thrombosis of deep veins of left upper extremity: Secondary | ICD-10-CM | POA: Diagnosis not present

## 2017-12-08 DIAGNOSIS — D539 Nutritional anemia, unspecified: Secondary | ICD-10-CM | POA: Diagnosis not present

## 2017-12-08 DIAGNOSIS — Z5181 Encounter for therapeutic drug level monitoring: Secondary | ICD-10-CM | POA: Diagnosis not present

## 2017-12-08 DIAGNOSIS — Z7901 Long term (current) use of anticoagulants: Secondary | ICD-10-CM | POA: Diagnosis not present

## 2017-12-13 DIAGNOSIS — N3941 Urge incontinence: Secondary | ICD-10-CM | POA: Diagnosis not present

## 2017-12-13 DIAGNOSIS — R0602 Shortness of breath: Secondary | ICD-10-CM | POA: Diagnosis not present

## 2017-12-14 DIAGNOSIS — R260 Ataxic gait: Secondary | ICD-10-CM | POA: Diagnosis not present

## 2017-12-21 DIAGNOSIS — I69993 Ataxia following unspecified cerebrovascular disease: Secondary | ICD-10-CM | POA: Diagnosis not present

## 2017-12-28 DIAGNOSIS — I69993 Ataxia following unspecified cerebrovascular disease: Secondary | ICD-10-CM | POA: Diagnosis not present

## 2018-01-01 DIAGNOSIS — I69993 Ataxia following unspecified cerebrovascular disease: Secondary | ICD-10-CM | POA: Diagnosis not present

## 2018-01-02 DIAGNOSIS — I82A12 Acute embolism and thrombosis of left axillary vein: Secondary | ICD-10-CM | POA: Diagnosis not present

## 2018-01-02 DIAGNOSIS — I350 Nonrheumatic aortic (valve) stenosis: Secondary | ICD-10-CM | POA: Diagnosis not present

## 2018-01-02 DIAGNOSIS — I2510 Atherosclerotic heart disease of native coronary artery without angina pectoris: Secondary | ICD-10-CM | POA: Diagnosis not present

## 2018-01-02 DIAGNOSIS — I10 Essential (primary) hypertension: Secondary | ICD-10-CM | POA: Diagnosis not present

## 2018-01-03 ENCOUNTER — Other Ambulatory Visit: Payer: Self-pay | Admitting: Interventional Cardiology

## 2018-01-03 MED ORDER — METOPROLOL SUCCINATE ER 25 MG PO TB24: 25.0000 mg | ORAL_TABLET | Freq: Every day | ORAL | 3 refills | Status: DC

## 2018-01-03 NOTE — Telephone Encounter (Signed)
Pt's medication was sent to pt's pharmacy as requested. Confirmation received.  °

## 2018-01-04 DIAGNOSIS — N3941 Urge incontinence: Secondary | ICD-10-CM | POA: Diagnosis not present

## 2018-01-05 DIAGNOSIS — I10 Essential (primary) hypertension: Secondary | ICD-10-CM | POA: Diagnosis not present

## 2018-01-05 DIAGNOSIS — I2510 Atherosclerotic heart disease of native coronary artery without angina pectoris: Secondary | ICD-10-CM | POA: Diagnosis not present

## 2018-01-11 DIAGNOSIS — R351 Nocturia: Secondary | ICD-10-CM | POA: Diagnosis not present

## 2018-01-11 DIAGNOSIS — N401 Enlarged prostate with lower urinary tract symptoms: Secondary | ICD-10-CM | POA: Diagnosis not present

## 2018-01-11 DIAGNOSIS — N3281 Overactive bladder: Secondary | ICD-10-CM | POA: Diagnosis not present

## 2018-01-16 DIAGNOSIS — H5203 Hypermetropia, bilateral: Secondary | ICD-10-CM | POA: Diagnosis not present

## 2018-01-25 DIAGNOSIS — N3941 Urge incontinence: Secondary | ICD-10-CM | POA: Diagnosis not present

## 2018-02-15 DIAGNOSIS — N3941 Urge incontinence: Secondary | ICD-10-CM | POA: Diagnosis not present

## 2018-02-16 DIAGNOSIS — I472 Ventricular tachycardia: Secondary | ICD-10-CM | POA: Diagnosis not present

## 2018-03-05 DIAGNOSIS — E1142 Type 2 diabetes mellitus with diabetic polyneuropathy: Secondary | ICD-10-CM | POA: Diagnosis not present

## 2018-03-08 DIAGNOSIS — N3941 Urge incontinence: Secondary | ICD-10-CM | POA: Diagnosis not present

## 2018-03-21 DIAGNOSIS — I10 Essential (primary) hypertension: Secondary | ICD-10-CM | POA: Diagnosis not present

## 2018-03-21 DIAGNOSIS — I442 Atrioventricular block, complete: Secondary | ICD-10-CM | POA: Diagnosis not present

## 2018-03-21 DIAGNOSIS — I350 Nonrheumatic aortic (valve) stenosis: Secondary | ICD-10-CM | POA: Diagnosis not present

## 2018-03-21 DIAGNOSIS — I2510 Atherosclerotic heart disease of native coronary artery without angina pectoris: Secondary | ICD-10-CM | POA: Diagnosis not present

## 2018-04-03 DIAGNOSIS — H26493 Other secondary cataract, bilateral: Secondary | ICD-10-CM | POA: Diagnosis not present

## 2018-04-03 DIAGNOSIS — H04123 Dry eye syndrome of bilateral lacrimal glands: Secondary | ICD-10-CM | POA: Diagnosis not present

## 2018-04-03 DIAGNOSIS — H0100A Unspecified blepharitis right eye, upper and lower eyelids: Secondary | ICD-10-CM | POA: Diagnosis not present

## 2018-04-03 DIAGNOSIS — H0100B Unspecified blepharitis left eye, upper and lower eyelids: Secondary | ICD-10-CM | POA: Diagnosis not present

## 2018-04-20 DIAGNOSIS — N3941 Urge incontinence: Secondary | ICD-10-CM | POA: Diagnosis not present

## 2018-05-01 DIAGNOSIS — L814 Other melanin hyperpigmentation: Secondary | ICD-10-CM | POA: Diagnosis not present

## 2018-05-01 DIAGNOSIS — R21 Rash and other nonspecific skin eruption: Secondary | ICD-10-CM | POA: Diagnosis not present

## 2018-05-01 DIAGNOSIS — L570 Actinic keratosis: Secondary | ICD-10-CM | POA: Diagnosis not present

## 2018-05-01 DIAGNOSIS — L579 Skin changes due to chronic exposure to nonionizing radiation, unspecified: Secondary | ICD-10-CM | POA: Diagnosis not present

## 2018-05-03 DIAGNOSIS — R3915 Urgency of urination: Secondary | ICD-10-CM | POA: Diagnosis not present

## 2018-05-03 DIAGNOSIS — N401 Enlarged prostate with lower urinary tract symptoms: Secondary | ICD-10-CM | POA: Diagnosis not present

## 2018-05-03 DIAGNOSIS — R351 Nocturia: Secondary | ICD-10-CM | POA: Diagnosis not present

## 2018-05-03 DIAGNOSIS — R350 Frequency of micturition: Secondary | ICD-10-CM | POA: Diagnosis not present

## 2018-05-07 DIAGNOSIS — D485 Neoplasm of uncertain behavior of skin: Secondary | ICD-10-CM | POA: Diagnosis not present

## 2018-05-07 DIAGNOSIS — R21 Rash and other nonspecific skin eruption: Secondary | ICD-10-CM | POA: Diagnosis not present

## 2018-05-07 DIAGNOSIS — L814 Other melanin hyperpigmentation: Secondary | ICD-10-CM | POA: Diagnosis not present

## 2018-05-07 DIAGNOSIS — L2089 Other atopic dermatitis: Secondary | ICD-10-CM | POA: Diagnosis not present

## 2018-05-07 DIAGNOSIS — L981 Factitial dermatitis: Secondary | ICD-10-CM | POA: Diagnosis not present

## 2018-05-09 DIAGNOSIS — S50862A Insect bite (nonvenomous) of left forearm, initial encounter: Secondary | ICD-10-CM | POA: Diagnosis not present

## 2018-05-10 DIAGNOSIS — R350 Frequency of micturition: Secondary | ICD-10-CM | POA: Diagnosis not present

## 2018-05-10 DIAGNOSIS — R3915 Urgency of urination: Secondary | ICD-10-CM | POA: Diagnosis not present

## 2018-05-23 ENCOUNTER — Telehealth: Payer: Self-pay | Admitting: Interventional Cardiology

## 2018-05-23 DIAGNOSIS — I10 Essential (primary) hypertension: Secondary | ICD-10-CM | POA: Diagnosis not present

## 2018-05-23 DIAGNOSIS — I2510 Atherosclerotic heart disease of native coronary artery without angina pectoris: Secondary | ICD-10-CM | POA: Diagnosis not present

## 2018-05-23 DIAGNOSIS — R42 Dizziness and giddiness: Secondary | ICD-10-CM | POA: Diagnosis not present

## 2018-05-23 DIAGNOSIS — I350 Nonrheumatic aortic (valve) stenosis: Secondary | ICD-10-CM | POA: Diagnosis not present

## 2018-05-23 NOTE — Telephone Encounter (Signed)
Pt is calling today b/c his PCP suggested he follow up with Dr Gregory Barry regarding his dizziness. Per Dr Gregory Barry's last OV note, pt has been having issues with dizziness for some time. Pt states it has been getting worse in the last few weeks.   Pt would like to be seen by Dr Gregory Barry asap. I advised pt Dr Gregory Barry has no availability this month or next. I offered an appointment for his next available, pt declined. I offered pt a sooner appointment with another provider or APP, pt declined. I have also advised pt to call his EP doctor, Dr Gregory Barry at [facility], perhaps they may be able to interrogate his device to look for any new dysrhythmia incase this is what is causing his increased dizziness. Pt states he has an appt in the next couple weeks with Dr Gregory Barry.   Pt would still like to be seen by only Dr Gregory Barry asap. I have told pt I would forward his request to Dr Gregory Barry's nurse. Pt understands she is out of the office until the week of 8/12 and Dr Gregory Barry has limited availability for the month of August. He would like a return call when nurse returns to the office.

## 2018-05-23 NOTE — Telephone Encounter (Signed)
New message   Patient calling stating that he is having some issues wanted to see Dr. Katrinka BlazingSmith only. I offered for the patient to see a PA. The patient does not want to see a PA.  Pt c/o Shortness Of Breath: STAT if SOB developed within the last 24 hours or pt is noticeably SOB on the phone  1. Are you currently SOB (can you hear that pt is SOB on the phone)? No   2. How long have you been experiencing SOB? 2-3 weeks  3. Are you SOB when sitting or when up moving around? Moving around  4. Are you currently experiencing any other symptoms? Dizziness when standing from sitting position, severe headache

## 2018-05-27 NOTE — Telephone Encounter (Signed)
Needs to be seen by team APP on day that I am also in office. I will then be able to personally participate during is visit.

## 2018-05-28 NOTE — Telephone Encounter (Signed)
Dr. Katrinka BlazingSmith had a cancellation Wednesday.  Called pt and scheduled him to see Dr. Katrinka BlazingSmith 8/14 at [facility].  Pt agreeable to appt.

## 2018-05-29 DIAGNOSIS — I442 Atrioventricular block, complete: Secondary | ICD-10-CM | POA: Diagnosis not present

## 2018-05-29 DIAGNOSIS — E1142 Type 2 diabetes mellitus with diabetic polyneuropathy: Secondary | ICD-10-CM | POA: Diagnosis not present

## 2018-05-30 ENCOUNTER — Ambulatory Visit: Payer: Medicare Other | Admitting: Interventional Cardiology

## 2018-05-30 ENCOUNTER — Encounter: Payer: Self-pay | Admitting: Interventional Cardiology

## 2018-05-30 VITALS — BP 144/76 | HR 66 | Ht 69.0 in | Wt 175.8 lb

## 2018-05-30 DIAGNOSIS — I350 Nonrheumatic aortic (valve) stenosis: Secondary | ICD-10-CM

## 2018-05-30 DIAGNOSIS — R51 Headache: Secondary | ICD-10-CM | POA: Diagnosis not present

## 2018-05-30 DIAGNOSIS — I5032 Chronic diastolic (congestive) heart failure: Secondary | ICD-10-CM

## 2018-05-30 DIAGNOSIS — I10 Essential (primary) hypertension: Secondary | ICD-10-CM

## 2018-05-30 DIAGNOSIS — I25810 Atherosclerosis of coronary artery bypass graft(s) without angina pectoris: Secondary | ICD-10-CM

## 2018-05-30 DIAGNOSIS — R519 Headache, unspecified: Secondary | ICD-10-CM

## 2018-05-30 NOTE — Progress Notes (Signed)
Cardiology Office Note:    Date:  05/30/2018   ID:  Lemar LoftyJohn H Trettel, DOB 09/09/1921, MRN [id number]  PCP:  Lesia HausenKummer, Anthony J, MD  Cardiologist:  No primary care provider on file.   Referring MD: Lesia HausenKummer, Anthony J, MD   Chief Complaint  Patient presents with  . Coronary Artery Disease  . Cardiac Valve Problem    AS  . Near Syncope    History of Present Illness:    Lemar LoftyJohn H Daus is a 84 y.o. male with a hx of CAD, hypertension,Aortic stenosis, previous coronary bypass grafting, hypertension, and chronic recurring episodes of dizziness.  Recent transient ischemic attacks.  Recent severe headache that developed after arising from a sitting position associated with imbalance and an incredible feeling as if his head or expanding.  He became unsteady but was able to sit down without falling.  Entire episode lasted 10 minutes.  Referred now by his primary physician because of concern that this could be related to aortic stenosis.  Past Medical History:  Diagnosis Date  . Allergic rhinitis   . Amnesia, global, transient    Episode was very brief & not associated with other neurlogical findings.  . Balance problem   . BPH (benign prostatic hyperplasia)   . CAD (coronary artery disease)   . Calculus of kidney   . Cataract   . Chronic lower back pain   . Constipated   . Coronary atherosclerosis    With sequential SVG to D#1 and #2, sequential SVG to OM #1 and #2, SVG to PDA, and LIMA to LAD. Patent grafts 2205 and no ischemia by Nuc 09/2009 [name]([facility])  . Discoid lupus   . Dizziness    With standing but could not document orthostasis today  . Essential hypertension, benign    followed by Dr. Mendel RyderH. Isael Stille - last seen late March, 2016, he wishes for the pt. to have further testing prior to clearing for surgery   . Fungal infection of toenail   . Hearing loss   . Insomnia, unspecified   . Meralgia paresthetica 1997  . Mixed hyperlipidemia   . Osteoarthrosis, unspecified whether generalized or  localized, lower leg    knees, back    . Overweight(278.02)   . Periarthritis 2001   Left shoulder  . Pneumonia    "when I was a child"  . Polyneuropathy in diabetes(357.2)   . Stroke (HCC) 01/2011   R BG CVA on MRI, MRA occluded R vertebral artery  . Triggering of finger    Left thumb and right fourth finger  . Type II diabetes mellitus (HCC)     Past Surgical History:  Procedure Laterality Date  . BACK SURGERY    . CORONARY ANGIOPLASTY WITH STENT PLACEMENT     With sequential SVG to D#1 and #2, sequential SVG to OM #1 and #2, SVG to PDA, and LIMA to LAD. Patent grafts 2205 and no ischemia by Nuc 09/2009 [name]([place])  . CORONARY ARTERY BYPASS GRAFT  1997   "CABG X5"  . HEMILAMINOTOMY LUMBAR SPINE  02/12/2015   Decompressive lumbar hemilaminectomy, medial facetectomy, foraminotomies at L1-2 and L2-3 on the left followed by sublaminar decompression  . INGUINAL HERNIA REPAIR Right 1997  . JOINT REPLACEMENT    . LESION REMOVAL     Benign lesions  . LITHOTRIPSY     [name]  . LUMBAR LAMINECTOMY  2006   "Dr. Yetta BarreJones"  . LUMBAR LAMINECTOMY/DECOMPRESSION MICRODISCECTOMY N/A 02/12/2015   Procedure: Laminectomy - Lumbar one -two lumbar  two -three;  Surgeon: Tia Alertavid S Jones, MD;  Location: [facility];  Service: Neurosurgery;  Laterality: N/A;  . TONSILLECTOMY    . TOTAL KNEE ARTHROPLASTY Left ~ 2005   [name]  . TOTAL KNEE ARTHROPLASTY Right 07/29/11    Current Medications: Current Meds  Medication Sig  . amoxicillin (AMOXIL) 500 MG tablet Take 4 tablets by mouth as needed. Take prior to dental procedures.  Marland Kitchen. aspirin EC 81 MG tablet Take 81 mg by mouth daily.   Marland Kitchen. atorvastatin (LIPITOR) 40 MG tablet Take 40 mg by mouth daily.  . Calcium Carbonate-Vitamin D (CALTRATE 600+D PO) Take 1 tablet by mouth daily.  . cholecalciferol (VITAMIN D) 1000 UNITS tablet Take 1,000 Units by mouth daily.  . Coenzyme Q-10 200 MG CAPS Take 200 mg by mouth daily.  . finasteride (PROSCAR) 5 MG tablet Take 5  mg by mouth daily.  Marland Kitchen. FREESTYLE LITE test strip 1 strip by Other route 4 (four) times daily. Use 1 strip to check glucose four times a day  . hydroxypropyl methylcellulose (ISOPTO TEARS) 2.5 % ophthalmic solution Place 1 drop into both eyes 4 (four) times daily.   . metFORMIN (GLUCOPHAGE) 500 MG tablet Take 500 mg by mouth 2 (two) times daily with a meal.  . metoprolol succinate (TOPROL-XL) 25 MG 24 hr tablet Take 1 tablet (25 mg total) by mouth daily.  . mirabegron ER (MYRBETRIQ) 50 MG TB24 tablet Take 50 mg by mouth daily.  . nitroGLYCERIN (NITROSTAT) 0.4 MG SL tablet Place 0.4 mg under the tongue every 5 (five) minutes as needed for chest pain (3 DOSES MAX).  . Omega-3 Fatty Acids (FISH OIL) 1200 MG CAPS Take 1 capsule by mouth 2 (two) times daily.  . pioglitazone (ACTOS) 15 MG tablet Take 15 mg by mouth 2 (two) times daily.  . polyethylene glycol (MIRALAX / GLYCOLAX) packet Take 17 g by mouth at bedtime as needed for mild constipation.   . tamsulosin (FLOMAX) 0.4 MG CAPS capsule Take 0.4 mg by mouth 2 (two) times daily.   . vitamin B-12 (CYANOCOBALAMIN) 1000 MCG tablet Take 1,000 mcg by mouth daily.     Allergies:   Simvastatin and Sporanos [itraconazole]   Social History   Socioeconomic History  . Marital status: Married    Spouse name: Not on file  . Number of children: Not on file  . Years of education: Not on file  . Highest education level: Not on file  Occupational History  . Not on file  Social Needs  . Financial resource strain: Not on file  . Food insecurity:    Worry: Not on file    Inability: Not on file  . Transportation needs:    Medical: Not on file    Non-medical: Not on file  Tobacco Use  . Smoking status: Former Smoker    Packs/day: 1.00    Years: 20.00    Pack years: 20.00    Types: Cigarettes  . Smokeless tobacco: Never Used  . Tobacco comment: "quit smoking cigarettes in 1964"  Substance and Sexual Activity  . Alcohol use: Yes    Alcohol/week: 5.0  standard drinks    Types: 5 Shots of liquor per week    Comment: 02/12/2015 "one, 1 shot cocktail before dinner 5 nights/week"  . Drug use: No  . Sexual activity: Never  Lifestyle  . Physical activity:    Days per week: Not on file    Minutes per session: Not on file  . Stress:  Not on file  Relationships  . Social connections:    Talks on phone: Not on file    Gets together: Not on file    Attends religious service: Not on file    Active member of club or organization: Not on file    Attends meetings of clubs or organizations: Not on file    Relationship status: Not on file  Other Topics Concern  . Not on file  Social History Narrative  . Not on file     Family History: The patient's family history includes Cancer in his maternal grandmother; Congestive Heart Failure in his father and mother; Down syndrome in his brother; Heart failure in his father and other; Hypertension in his mother; Pneumonia in his brother; Stroke in his mother and other; Thrombosis in his mother.  ROS:   Please see the history of present illness.    Vision disturbance, shortness of breath with activity, dizziness with changing positions, pain, back pain, and twitching while sleeping.  All other systems reviewed and are negative.  EKGs/Labs/Other Studies Reviewed:    The following studies were reviewed today: 2D Doppler echocardiogram 06/01/2017: Study Conclusions   - Left ventricle: The cavity size was normal. There was mild   concentric hypertrophy. Systolic function was normal. The   estimated ejection fraction was in the range of 60% to 65%. Wall   motion was normal; there were no regional wall motion   abnormalities. Left ventricular diastolic function parameters   were normal. - Aortic valve: Right coronary cusp mobility was moderately   restricted. There was moderate stenosis. Valve area (VTI): 1.02   cm^2. Valve area (Vmax): 1.02 cm^2. Valve area (Vmean): 0.92   cm^2. - Mitral valve:  Calcified annulus.   EKG:  EKG is  ordered today.  The ekg ordered today demonstrates sinus rhythm with relatively low voltage, old inferior infarct, and no acute ST-T abnormality.  Recent Labs: No results found for requested labs within last 8760 hours.  Recent Lipid Panel No results found for: CHOL, TRIG, HDL, CHOLHDL, VLDL, LDLCALC, LDLDIRECT  Physical Exam:    VS:  BP (!) 144/76   Pulse 66   Ht 5\' 9"  (1.753 m)   Wt 175 lb 12.8 oz (79.7 kg)   BMI 25.96 kg/m     Wt Readings from Last 3 Encounters:  05/30/18 175 lb 12.8 oz (79.7 kg)  11/24/17 170 lb 3.2 oz (77.2 kg)  09/01/16 173 lb (78.5 kg)     GEN: Elderly but appears younger than stated age.  Well developed in no acute distress. HEENT: Normal NECK: No JVD. LYMPHATICS: No lymphadenopathy CARDIAC: RRR, 2-3 over 6 high-pitched crescendo decrescendo systolic aortic valve diamond-shaped murmur, no gallop, trace to 1+ bilateral ankle edema. VASCULAR: 2+ radial bilateral pulses.  Transmitted from aortic valve bilateral carotid bruits. RESPIRATORY:  Clear to auscultation without rales, wheezing or rhonchi  ABDOMEN: Soft, non-tender, non-distended, No pulsatile mass, MUSCULOSKELETAL: No deformity  SKIN: Warm and dry NEUROLOGIC:  Alert and oriented x 3 PSYCHIATRIC:  Normal affect   ASSESSMENT:    1. Acute nonintractable headache, unspecified headache type   2. Aortic valve stenosis, etiology of cardiac valve disease unspecified   3. Coronary artery disease involving coronary bypass graft of native heart without angina pectoris   4. Chronic diastolic heart failure (HCC)   5. Essential hypertension    PLAN:    In order of problems listed above:  1. Acute headache with feeling of expanding sensation which lasted 10  minutes and was associated with unsteadiness and near syncope.  This particular event is not felt to be related to cardiac disease of any sort and certainly not a typical complaint and syncope associated with  aortic stenosis.  The event raises the question of an intracranial hemorrhage or some other event.  Needs to have scanning performed if at all possible to exclude space-occupying lesion. 2. We will do an echocardiogram to update severity of aortic stenosis.  Last evaluation 1 year ago suggested moderate aortic stenosis. 3. No symptoms to suggest angina.  At his age no imaging or other studies will be done unless he has symptoms that suggest ischemia. 4. No evidence of volume overload. 5. Target blood pressure 140/90 mmHg or less.  No change in therapy.  Recommend neurology evaluation for chronic complaint of dizziness.  I do not feel these episodes are related to aortic stenosis.   Medication Adjustments/Labs and Tests Ordered: Current medicines are reviewed at length with the patient today.  Concerns regarding medicines are outlined above.  Orders Placed This Encounter  Procedures  . EKG 12-Lead  . ECHOCARDIOGRAM COMPLETE   No orders of the defined types were placed in this encounter.   Patient Instructions  Medication Instructions:  Your physician recommends that you continue on your current medications as directed. Please refer to the Current Medication list given to you today.  Labwork: None  Testing/Procedures: Your physician has requested that you have an echocardiogram. Echocardiography is a painless test that uses sound waves to create images of your heart. It provides your doctor with information about the size and shape of your heart and how well your heart's chambers and valves are working. This procedure takes approximately one hour. There are no restrictions for this procedure.   Follow-Up: Your physician wants you to follow-up in: 1 year with Dr. [name].  You will receive a reminder letter in the mail two months in advance. If you don't receive a letter, please call our office to schedule the follow-up appointment.   Any Other Special Instructions Will Be Listed Below  (If Applicable).     If you need a refill on your cardiac medications before your next appointment, please call your pharmacy.      Signed, Lesleigh Noe, MD  05/30/2018 5:56 PM    [facility]

## 2018-05-30 NOTE — Patient Instructions (Signed)
Medication Instructions:  Your physician recommends that you continue on your current medications as directed. Please refer to the Current Medication list given to you today.  Labwork: None  Testing/Procedures: Your physician has requested that you have an echocardiogram. Echocardiography is a painless test that uses sound waves to create images of your heart. It provides your doctor with information about the size and shape of your heart and how well your heart's chambers and valves are working. This procedure takes approximately one hour. There are no restrictions for this procedure.   Follow-Up: Your physician wants you to follow-up in: 1 year with Dr. Smith.  You will receive a reminder letter in the mail two months in advance. If you don't receive a letter, please call our office to schedule the follow-up appointment.   Any Other Special Instructions Will Be Listed Below (If Applicable).     If you need a refill on your cardiac medications before your next appointment, please call your pharmacy.   

## 2018-05-31 DIAGNOSIS — R51 Headache: Secondary | ICD-10-CM | POA: Diagnosis not present

## 2018-05-31 DIAGNOSIS — N3941 Urge incontinence: Secondary | ICD-10-CM | POA: Diagnosis not present

## 2018-06-01 ENCOUNTER — Other Ambulatory Visit: Payer: Self-pay

## 2018-06-01 ENCOUNTER — Ambulatory Visit (HOSPITAL_COMMUNITY): Payer: Medicare Other | Attending: Interventional Cardiology

## 2018-06-01 DIAGNOSIS — I080 Rheumatic disorders of both mitral and aortic valves: Secondary | ICD-10-CM | POA: Insufficient documentation

## 2018-06-01 DIAGNOSIS — Z951 Presence of aortocoronary bypass graft: Secondary | ICD-10-CM | POA: Diagnosis not present

## 2018-06-01 DIAGNOSIS — I5032 Chronic diastolic (congestive) heart failure: Secondary | ICD-10-CM

## 2018-06-01 DIAGNOSIS — I442 Atrioventricular block, complete: Secondary | ICD-10-CM | POA: Diagnosis not present

## 2018-06-01 DIAGNOSIS — E785 Hyperlipidemia, unspecified: Secondary | ICD-10-CM | POA: Insufficient documentation

## 2018-06-01 DIAGNOSIS — Z87891 Personal history of nicotine dependence: Secondary | ICD-10-CM | POA: Diagnosis not present

## 2018-06-01 DIAGNOSIS — I110 Hypertensive heart disease with heart failure: Secondary | ICD-10-CM | POA: Insufficient documentation

## 2018-06-01 DIAGNOSIS — I350 Nonrheumatic aortic (valve) stenosis: Secondary | ICD-10-CM

## 2018-06-01 DIAGNOSIS — G459 Transient cerebral ischemic attack, unspecified: Secondary | ICD-10-CM | POA: Diagnosis not present

## 2018-06-01 DIAGNOSIS — I2510 Atherosclerotic heart disease of native coronary artery without angina pectoris: Secondary | ICD-10-CM | POA: Diagnosis not present

## 2018-06-04 NOTE — Progress Notes (Signed)
Cardiology Office Note:    Date:  06/05/2018   ID:  Gregory Barry, DOB July 08, 1921, MRN [id number]  PCP:  Lesia Hausen, MD  Cardiologist:  No primary care provider on file.   Referring MD: Lesia Hausen, MD   Chief Complaint  Patient presents with  . Cardiac Valve Problem    History of Present Illness:    Gregory Barry is a 84 y.o. male with a hx of CAD, hypertension,Aortic stenosis, previous coronary bypass grafting, hypertension, and chronic recurring episodes of dizziness.Recent transient ischemic attacks. Recent echo shows progression of AS to severe.  Please refer to the previous note last week.  Echocardiography demonstrated critical aortic stenosis.  I have the patient come in to discuss the ramifications of our findings.  Speaking to the patient he has dyspnea and weakness on exertion.  Had to stop twice trying to get into the building.  Within the past month he has had one particularly scary episode where he felt unsteady but was associated with severe hemicranial headache.  He denies angina.  He is not having orthopnea or PND.   Past Medical History:  Diagnosis Date  . Allergic rhinitis   . Amnesia, global, transient    Episode was very brief & not associated with other neurlogical findings.  . Balance problem   . BPH (benign prostatic hyperplasia)   . CAD (coronary artery disease)   . Calculus of kidney   . Cataract   . Chronic lower back pain   . Constipated   . Coronary atherosclerosis    With sequential SVG to D#1 and #2, sequential SVG to OM #1 and #2, SVG to PDA, and LIMA to LAD. Patent grafts 2205 and no ischemia by Nuc 09/2009 [name])  . Discoid lupus   . Dizziness    With standing but could not document orthostasis today  . Essential hypertension, benign    followed by Dr. Mendel Ryder - last seen late March, 2016, he wishes for the pt. to have further testing prior to clearing for surgery   . Fungal infection of toenail   . Hearing loss   .  Insomnia, unspecified   . Meralgia paresthetica 1997  . Mixed hyperlipidemia   . Osteoarthrosis, unspecified whether generalized or localized, lower leg    knees, back    . Overweight(278.02)   . Periarthritis 2001   Left shoulder  . Pneumonia    "when I was a child"  . Polyneuropathy in diabetes(357.2)   . Stroke (HCC) 01/2011   R BG CVA on MRI, MRA occluded R vertebral artery  . Triggering of finger    Left thumb and right fourth finger  . Type II diabetes mellitus (HCC)     Past Surgical History:  Procedure Laterality Date  . BACK SURGERY    . CORONARY ANGIOPLASTY WITH STENT PLACEMENT     With sequential SVG to D#1 and #2, sequential SVG to OM #1 and #2, SVG to PDA, and LIMA to LAD. Patent grafts 2205 and no ischemia by Nuc 09/2009 [name])  . CORONARY ARTERY BYPASS GRAFT  1997   "CABG X5"  . HEMILAMINOTOMY LUMBAR SPINE  02/12/2015   Decompressive lumbar hemilaminectomy, medial facetectomy, foraminotomies at L1-2 and L2-3 on the left followed by sublaminar decompression  . INGUINAL HERNIA REPAIR Right 1997  . JOINT REPLACEMENT    . LESION REMOVAL     Benign lesions  . LITHOTRIPSY     [name]  . LUMBAR LAMINECTOMY  2006   "  [name]"  . LUMBAR LAMINECTOMY/DECOMPRESSION MICRODISCECTOMY N/A 02/12/2015   Procedure: Laminectomy - Lumbar one -two lumbar two -three;  Surgeon: Tia Alert, MD;  Location: [facility];  Service: Neurosurgery;  Laterality: N/A;  . TONSILLECTOMY    . TOTAL KNEE ARTHROPLASTY Left ~ 2005   [name]  . TOTAL KNEE ARTHROPLASTY Right 07/29/11    Current Medications: Current Meds  Medication Sig  . amoxicillin (AMOXIL) 500 MG tablet Take 4 tablets by mouth as needed. Take prior to dental procedures.  Marland Kitchen aspirin EC 81 MG tablet Take 81 mg by mouth daily.   Marland Kitchen atorvastatin (LIPITOR) 40 MG tablet Take 40 mg by mouth daily.  . Calcium Carbonate-Vitamin D (CALTRATE 600+D PO) Take 1 tablet by mouth daily.  . cholecalciferol (VITAMIN D) 1000 UNITS tablet  Take 1,000 Units by mouth daily.  . Coenzyme Q-10 200 MG CAPS Take 200 mg by mouth daily.  . finasteride (PROSCAR) 5 MG tablet Take 5 mg by mouth daily.  Marland Kitchen FREESTYLE LITE test strip 1 strip by Other route 4 (four) times daily. Use 1 strip to check glucose four times a day  . hydroxypropyl methylcellulose (ISOPTO TEARS) 2.5 % ophthalmic solution Place 1 drop into both eyes 4 (four) times daily.   . metFORMIN (GLUCOPHAGE) 500 MG tablet Take 500 mg by mouth 2 (two) times daily with a meal.  . metoprolol succinate (TOPROL-XL) 25 MG 24 hr tablet Take 1 tablet (25 mg total) by mouth daily.  . mirabegron ER (MYRBETRIQ) 50 MG TB24 tablet Take 50 mg by mouth daily.  . nitroGLYCERIN (NITROSTAT) 0.4 MG SL tablet Place 0.4 mg under the tongue every 5 (five) minutes as needed for chest pain (3 DOSES MAX).  . Omega-3 Fatty Acids (FISH OIL) 1200 MG CAPS Take 1 capsule by mouth 2 (two) times daily.  . pioglitazone (ACTOS) 15 MG tablet Take 15 mg by mouth 2 (two) times daily.  . polyethylene glycol (MIRALAX / GLYCOLAX) packet Take 17 g by mouth at bedtime as needed for mild constipation.   . tamsulosin (FLOMAX) 0.4 MG CAPS capsule Take 0.4 mg by mouth 2 (two) times daily.   . vitamin B-12 (CYANOCOBALAMIN) 1000 MCG tablet Take 1,000 mcg by mouth daily.     Allergies:   Simvastatin and Sporanos [itraconazole]   Social History   Socioeconomic History  . Marital status: Married    Spouse name: Not on file  . Number of children: Not on file  . Years of education: Not on file  . Highest education level: Not on file  Occupational History  . Not on file  Social Needs  . Financial resource strain: Not on file  . Food insecurity:    Worry: Not on file    Inability: Not on file  . Transportation needs:    Medical: Not on file    Non-medical: Not on file  Tobacco Use  . Smoking status: Former Smoker    Packs/day: 1.00    Years: 20.00    Pack years: 20.00    Types: Cigarettes  . Smokeless tobacco: Never  Used  . Tobacco comment: "quit smoking cigarettes in 1964"  Substance and Sexual Activity  . Alcohol use: Yes    Alcohol/week: 5.0 standard drinks    Types: 5 Shots of liquor per week    Comment: 02/12/2015 "one, 1 shot cocktail before dinner 5 nights/week"  . Drug use: No  . Sexual activity: Never  Lifestyle  . Physical activity:  Days per week: Not on file    Minutes per session: Not on file  . Stress: Not on file  Relationships  . Social connections:    Talks on phone: Not on file    Gets together: Not on file    Attends religious service: Not on file    Active member of club or organization: Not on file    Attends meetings of clubs or organizations: Not on file    Relationship status: Not on file  Other Topics Concern  . Not on file  Social History Narrative  . Not on file     Family History: The patient's family history includes Cancer in his maternal grandmother; Congestive Heart Failure in his father and mother; Down syndrome in his brother; Heart failure in his father and other; Hypertension in his mother; Pneumonia in his brother; Stroke in his mother and other; Thrombosis in his mother.  ROS:   Please see the history of present illness.    Swelling, near continuous sensation of dizziness, and significant lower back discomfort.  All other systems reviewed and are negative.  EKGs/Labs/Other Studies Reviewed:    The following studies were reviewed today:  2D Doppler echocardiogram 05/2018: Study Conclusions  - Left ventricle: The cavity size was normal. Wall thickness was   normal. Systolic function was normal. The estimated ejection   fraction was in the range of 55% to 60%. Wall motion was normal;   there were no regional wall motion abnormalities. Doppler   parameters are consistent with abnormal left ventricular   relaxation (grade 1 diastolic dysfunction). - Aortic valve: Valve mobility was restricted. There was severe   stenosis. Peak velocity (S): 433  cm/s. Mean gradient (S): 41 mm   Hg. Peak gradient (S): 75 mm Hg. - Mitral valve: Severely calcified annulus. - Left atrium: The atrium was mildly dilated. - Right ventricle: Pacer wire or catheter noted in right ventricle. - Pulmonary arteries: Systolic pressure was moderately increased.   PA peak pressure: 49 mm Hg (S). - Line: A venous catheter was visualized in the superior vena cava,   with its tip in the right atrium. No abnormal features noted.   EKG:  EKG is not ordered today.    Recent Labs: No results found for requested labs within last 8760 hours.  Recent Lipid Panel No results found for: CHOL, TRIG, HDL, CHOLHDL, VLDL, LDLCALC, LDLDIRECT  Physical Exam:    VS:  BP (!) 152/72   Pulse 79   Ht 5\' 7"  (1.702 m)   Wt 172 lb (78 kg)   BMI 26.94 kg/m     Wt Readings from Last 3 Encounters:  06/05/18 172 lb (78 kg)  05/30/18 175 lb 12.8 oz (79.7 kg)  11/24/17 170 lb 3.2 oz (77.2 kg)     GEN: Elderly, frail but  in no acute distress HEENT: Normal NECK: No JVD.Marland Kitchen.  Diminished carotid upstroke. LYMPHATICS: No lymphadenopathy CARDIAC: RRR, 4/6 draped left lower and right upper sternal border systolic murmur consistent with aortic stenosis, soft S4 gallop, 1-2+ bilateral ankle edema. VASCULAR: Symmetrical 2+ radial pulses.  Bilateral transmitted carotid bruits. RESPIRATORY:  Clear to auscultation without rales, wheezing or rhonchi  ABDOMEN: Soft, non-tender, non-distended, No pulsatile mass, MUSCULOSKELETAL: No deformity  SKIN: Warm and dry NEUROLOGIC:  Alert and oriented x 3 PSYCHIATRIC:  Normal affect   ASSESSMENT:    1. Severe aortic stenosis   2. Coronary artery disease involving coronary bypass graft of native heart without angina pectoris  3. Complete heart block (HCC)   4. Essential hypertension    PLAN:    In order of problems listed above:  1. Severe symptomatic aortic stenosis, stage D.  Symptom is felt to be exertional dyspnea and fatigue.  Discussed  natural history of severe symptomatic aortic stenosis.  Do not believe that the episode of dizziness/instability with severe headache is related to the valve.  Discussed options including conservative medical treatment with palliative approach/hospice when appropriate.  Alternative management strategy could be structural heart team referral with an eye towards transaortic valve replacement therapy if appropriate.  Discussed the TAVR approach and work-up necessary to determine suitability.  He understands that he will need CT scanning for anatomy staging and left and right heart catheterization to determine coronary and bypass graft patency.  I do not believe a surgical option exists at this patient's age.  He and his wife are inclined to meet with the structural heart team and will make a final decision about whether to proceed with TAVR work-up after that discussion.  If he decides in favor of TAVR, we will get him set up for left to right heart cath with coronary and bypass graft angiography as well as aortic and peripheral imaging to determine suitability at the direction of the structural heart team.  I am happy to perform cath on the patient if needed.     Medication Adjustments/Labs and Tests Ordered: Current medicines are reviewed at length with the patient today.  Concerns regarding medicines are outlined above.  No orders of the defined types were placed in this encounter.  No orders of the defined types were placed in this encounter.   There are no Patient Instructions on file for this visit.   Signed, Lesleigh NoeHenry W Bettylou Frew III, MD  06/05/2018 1:54 PM    [facility]

## 2018-06-05 ENCOUNTER — Encounter: Payer: Self-pay | Admitting: Interventional Cardiology

## 2018-06-05 ENCOUNTER — Ambulatory Visit: Payer: Medicare Other | Admitting: Interventional Cardiology

## 2018-06-05 VITALS — BP 152/72 | HR 79 | Ht 67.0 in | Wt 172.0 lb

## 2018-06-05 DIAGNOSIS — I10 Essential (primary) hypertension: Secondary | ICD-10-CM | POA: Diagnosis not present

## 2018-06-05 DIAGNOSIS — I25810 Atherosclerosis of coronary artery bypass graft(s) without angina pectoris: Secondary | ICD-10-CM | POA: Diagnosis not present

## 2018-06-05 DIAGNOSIS — I442 Atrioventricular block, complete: Secondary | ICD-10-CM | POA: Diagnosis not present

## 2018-06-05 DIAGNOSIS — I350 Nonrheumatic aortic (valve) stenosis: Secondary | ICD-10-CM

## 2018-06-05 NOTE — Patient Instructions (Signed)
Medication Instructions:  Your physician recommends that you continue on your current medications as directed. Please refer to the Current Medication list given to you today.  Labwork: None  Testing/Procedures: None  Follow-Up: Your physician recommends that you schedule a follow-up appointment next available with TAVR team.     Any Other Special Instructions Will Be Listed Below (If Applicable).     If you need a refill on your cardiac medications before your next appointment, please call your pharmacy.

## 2018-06-06 ENCOUNTER — Encounter: Payer: Self-pay | Admitting: Physician Assistant

## 2018-06-11 ENCOUNTER — Ambulatory Visit: Payer: Medicare Other | Admitting: Cardiovascular Disease

## 2018-06-11 ENCOUNTER — Encounter: Payer: Self-pay | Admitting: Cardiovascular Disease

## 2018-06-11 VITALS — BP 138/64 | HR 64 | Ht 67.0 in | Wt 174.0 lb

## 2018-06-11 DIAGNOSIS — I350 Nonrheumatic aortic (valve) stenosis: Secondary | ICD-10-CM | POA: Diagnosis not present

## 2018-06-11 NOTE — Progress Notes (Signed)
Valve Clinic Consult Note  Chief Complaint  Patient presents with  . New Patient (Initial Visit)    CAD   History of Present Illness: 84 yo male with history of complete heart block s/p PPM, CAD s/p CABG, HTN, hyperlipidemia, discoid lupus, prior TIA, diabetes and severe aortic stenosis who is referred today by Dr. [name] for further evaluation of aortic stenosis and discussion regarding TAVR. He has been followed for moderate aortic stenosis. He is known to have CAD and had a 6V CABG in 1997. Last cath in 2005 with 6 patents bypass grafts. Complete heart block in December 2018 and a permanent pacemaker was placed in [facility]. Most recent echo August 2019 with normal LV size and function, LVEF=55-60%. The aortic valve is thickened and calcified with restricted leaflet motion. Mean gradient 41 mmHg, peak gradient 75 mmHg, AVA 0.79cm2. DVI 0.23. He has ongoing dizziness with recent near syncope three weeks ago. He has worsened dyspnea with exertion and overall worsened fatigue and weakness. Occasional chest pressure. No lower extremity edema.   Primary Care Physician: Lesia Hausen, MD Primary Cardiologist: Verdis Prime, MD Referring Cardiologist: Verdis Prime, MD  Past Medical History:  Diagnosis Date  . Balance problem   . BPH (benign prostatic hyperplasia)   . CAD (coronary artery disease)    With sequential SVG to D#1 and #2, sequential SVG to OM #1 and #2, SVG to PDA, and LIMA to LAD. Patent grafts 2205 and no ischemia by Nuc 09/2009 [name])  . Calculus of kidney   . Cataract   . Discoid lupus   . Dizziness    With standing but could not document orthostasis today  . Essential hypertension, benign    followed by Dr. Mendel Ryder - last seen late March, 2016, he wishes for the pt. to have further testing prior to clearing for surgery   . Hearing loss   . Meralgia paresthetica 1997  . Mixed hyperlipidemia   . Overweight(278.02)   . Polyneuropathy in diabetes(357.2)   . Severe  aortic stenosis   . Stroke (HCC) 01/2011   R BG CVA on MRI, MRA occluded R vertebral artery  . Type II diabetes mellitus (HCC)     Past Surgical History:  Procedure Laterality Date  . BACK SURGERY    . CORONARY ANGIOPLASTY WITH STENT PLACEMENT     With sequential SVG to D#1 and #2, sequential SVG to OM #1 and #2, SVG to PDA, and LIMA to LAD. Patent grafts 2205 and no ischemia by Nuc 09/2009 [name])  . CORONARY ARTERY BYPASS GRAFT  1997   "CABG X5"  . HEMILAMINOTOMY LUMBAR SPINE  02/12/2015   Decompressive lumbar hemilaminectomy, medial facetectomy, foraminotomies at L1-2 and L2-3 on the left followed by sublaminar decompression  . INGUINAL HERNIA REPAIR Right 1997  . JOINT REPLACEMENT    . LESION REMOVAL     Benign lesions  . LITHOTRIPSY     [name]  . LUMBAR LAMINECTOMY  2006   "[name]"  . LUMBAR LAMINECTOMY/DECOMPRESSION MICRODISCECTOMY N/A 02/12/2015   Procedure: Laminectomy - Lumbar one -two lumbar two -three;  Surgeon: Tia Alert, MD;  Location: [facility];  Service: Neurosurgery;  Laterality: N/A;  . TONSILLECTOMY    . TOTAL KNEE ARTHROPLASTY Left ~ 2005   [name]  . TOTAL KNEE ARTHROPLASTY Right 07/29/11    Current Outpatient Medications  Medication Sig Dispense Refill  . amoxicillin (AMOXIL) 500 MG tablet Take 4 tablets by mouth as needed. Take prior to  dental procedures.    Marland Kitchen aspirin EC 81 MG tablet Take 81 mg by mouth daily.     Marland Kitchen atorvastatin (LIPITOR) 40 MG tablet Take 40 mg by mouth daily.    . Calcium Carbonate-Vitamin D (CALTRATE 600+D PO) Take 1 tablet by mouth daily.    . cholecalciferol (VITAMIN D) 1000 UNITS tablet Take 1,000 Units by mouth daily.    . Coenzyme Q-10 200 MG CAPS Take 200 mg by mouth daily.    . finasteride (PROSCAR) 5 MG tablet Take 5 mg by mouth daily.    Marland Kitchen FREESTYLE LITE test strip 1 strip by Other route 4 (four) times daily. Use 1 strip to check glucose four times a day  0  . hydroxypropyl methylcellulose (ISOPTO TEARS) 2.5 %  ophthalmic solution Place 1 drop into both eyes 4 (four) times daily.     . metFORMIN (GLUCOPHAGE) 500 MG tablet Take 500 mg by mouth 2 (two) times daily with a meal.    . metoprolol succinate (TOPROL-XL) 25 MG 24 hr tablet Take 1 tablet (25 mg total) by mouth daily. 90 tablet 3  . mirabegron ER (MYRBETRIQ) 50 MG TB24 tablet Take 50 mg by mouth daily.    . nitroGLYCERIN (NITROSTAT) 0.4 MG SL tablet Place 0.4 mg under the tongue every 5 (five) minutes as needed for chest pain (3 DOSES MAX).    . Omega-3 Fatty Acids (FISH OIL) 1200 MG CAPS Take 1 capsule by mouth 2 (two) times daily.    . pioglitazone (ACTOS) 15 MG tablet Take 15 mg by mouth 2 (two) times daily.    . polyethylene glycol (MIRALAX / GLYCOLAX) packet Take 17 g by mouth at bedtime as needed for mild constipation.     . tamsulosin (FLOMAX) 0.4 MG CAPS capsule Take 0.4 mg by mouth 2 (two) times daily.     . vitamin B-12 (CYANOCOBALAMIN) 1000 MCG tablet Take 1,000 mcg by mouth daily.     No current facility-administered medications for this visit.     Allergies  Allergen Reactions  . Simvastatin Other (See Comments)    Leg cramps  . Sporanos [Itraconazole] Rash and Itching    Social History   Socioeconomic History  . Marital status: Married    Spouse name: Not on file  . Number of children: 2  . Years of education: Not on file  . Highest education level: Not on file  Occupational History  . Occupation: Retired Paediatric nurse  . Financial resource strain: Not on file  . Food insecurity:    Worry: Not on file    Inability: Not on file  . Transportation needs:    Medical: Not on file    Non-medical: Not on file  Tobacco Use  . Smoking status: Former Smoker    Packs/day: 1.00    Years: 20.00    Pack years: 20.00    Types: Cigarettes  . Smokeless tobacco: Never Used  . Tobacco comment: "quit smoking cigarettes in 1964"  Substance and Sexual Activity  . Alcohol use: Yes    Alcohol/week: 5.0 standard drinks      Types: 5 Shots of liquor per week    Comment: 02/12/2015 "one, 1 shot cocktail before dinner 5 nights/week"  . Drug use: No  . Sexual activity: Not Currently  Lifestyle  . Physical activity:    Days per week: Not on file    Minutes per session: Not on file  . Stress: Not on file  Relationships  .  Social connections:    Talks on phone: Not on file    Gets together: Not on file    Attends religious service: Not on file    Active member of club or organization: Not on file    Attends meetings of clubs or organizations: Not on file    Relationship status: Not on file  . Intimate partner violence:    Fear of current or ex partner: Not on file    Emotionally abused: Not on file    Physically abused: Not on file    Forced sexual activity: Not on file  Other Topics Concern  . Not on file  Social History Narrative  . Not on file    Family History  Problem Relation Age of Onset  . Thrombosis Mother        coronary  . Hypertension Mother   . Congestive Heart Failure Mother   . Stroke Mother   . Heart failure Father   . Congestive Heart Failure Father   . Down syndrome Brother   . Pneumonia Brother   . Cancer Maternal Grandmother        stomach  . Heart failure Other        Fam Hx of heart failure  . Stroke Other        Fam Hx of CVA    Review of Systems:  As stated in the HPI and otherwise negative.   BP 138/64   Pulse 64   Ht 5\' 7"  (1.702 m)   Wt 174 lb (78.9 kg)   BMI 27.25 kg/m   Physical Examination: General: Well developed, well nourished, NAD  HEENT: OP clear, mucus membranes moist  SKIN: warm, dry. No rashes. Neuro: No focal deficits  Musculoskeletal: Muscle strength 5/5 all ext  Psychiatric: Mood and affect normal  Neck: No JVD, no carotid bruits, no thyromegaly, no lymphadenopathy.  Lungs:Clear bilaterally, no wheezes, rhonci, crackles Cardiovascular: Regular rate and rhythm. Loud, harsh, late peaking systolic murmur.  Abdomen:Soft. Bowel sounds  present. Non-tender.  Extremities: No lower extremity edema. Pulses are 2 + in the bilateral DP/PT.  Echo 06/01/18: Left ventricle: The cavity size was normal. Wall thickness was   normal. Systolic function was normal. The estimated ejection   fraction was in the range of 55% to 60%. Wall motion was normal;   there were no regional wall motion abnormalities. Doppler   parameters are consistent with abnormal left ventricular   relaxation (grade 1 diastolic dysfunction). - Aortic valve: Valve mobility was restricted. There was severe   stenosis. Peak velocity (S): 433 cm/s. Mean gradient (S): 41 mm   Hg. Peak gradient (S): 75 mm Hg. - Mitral valve: Severely calcified annulus. - Left atrium: The atrium was mildly dilated. - Right ventricle: Pacer wire or catheter noted in right ventricle. - Pulmonary arteries: Systolic pressure was moderately increased.   PA peak pressure: 49 mm Hg (S). - Line: A venous catheter was visualized in the superior vena cava,   with its tip in the right atrium. No abnormal features noted. ------------------------------------------------------------------- Left ventricle:  The cavity size was normal. Wall thickness was normal. Systolic function was normal. The estimated ejection fraction was in the range of 55% to 60%. Wall motion was normal; there were no regional wall motion abnormalities. Doppler parameters are consistent with abnormal left ventricular relaxation (grade 1 diastolic dysfunction).  ------------------------------------------------------------------- Aortic valve:   Trileaflet; severely thickened, severely calcified leaflets. Valve mobility was restricted.  Doppler:   There was severe  stenosis.   There was no regurgitation.    VTI ratio of LVOT to aortic valve: 0.24. Valve area (VTI): 0.83 cm^2. Indexed valve area (VTI): 0.42 cm^2/m^2. Peak velocity ratio of LVOT to aortic valve: 0.23. Valve area (Vmax): 0.79 cm^2. Indexed valve area (Vmax):  0.4 cm^2/m^2. Mean velocity ratio of LVOT to aortic valve: 0.23. Valve area (Vmean): 0.79 cm^2. Indexed valve area (Vmean): 0.4 cm^2/m^2.    Mean gradient (S): 41 mm Hg. Peak gradient (S): 75 mm Hg.  ------------------------------------------------------------------- Aorta:  Aortic root: The aortic root was normal in size.  ------------------------------------------------------------------- Mitral valve:   Severely calcified annulus. Mobility was not restricted.  Doppler:  Transvalvular velocity was within the normal range. There was no evidence for stenosis. There was no regurgitation.    Valve area by pressure half-time: 3.14 cm^2. Indexed valve area by pressure half-time: 1.58 cm^2/m^2.    Peak gradient (D): 9 mm Hg.  ------------------------------------------------------------------- Left atrium:  The atrium was mildly dilated.  ------------------------------------------------------------------- Right ventricle:  The cavity size was normal. Wall thickness was normal. Pacer wire or catheter noted in right ventricle. Systolic function was normal.  ------------------------------------------------------------------- Pulmonic valve:   Poorly visualized.  Structurally normal valve. Cusp separation was normal.  Doppler:  Transvalvular velocity was within the normal range. There was no evidence for stenosis. There was no regurgitation.  ------------------------------------------------------------------- Tricuspid valve:   Structurally normal valve.    Doppler: Transvalvular velocity was within the normal range. There was mild regurgitation.  ------------------------------------------------------------------- Pulmonary artery:   The main pulmonary artery was normal-sized. Systolic pressure was moderately increased.  ------------------------------------------------------------------- Right atrium:  The atrium was normal in  size.  ------------------------------------------------------------------- Pericardium:  There was no pericardial effusion.  ------------------------------------------------------------------- Systemic veins: Line: A venous catheter was visualized in the superior vena cava, with its tip in the right atrium. No abnormal features noted. Inferior vena cava: The vessel was normal in size.  ------------------------------------------------------------------- Measurements   Left ventricle                            Value          Reference  LV ID, ED, PLAX chordal           (L)     42    mm       43 - 52  LV ID, ES, PLAX chordal                   30    mm       23 - 38  LV fx shortening, PLAX chordal            29    %        >=29  LV PW thickness, ED                       10    mm       ---------  IVS/LV PW ratio, ED                       1              <=1.3  Stroke volume, 2D                         86    ml       ---------  Stroke volume/bsa, 2D  43    ml/m^2   ---------  LV e&', lateral                            8.27  cm/s     ---------  LV E/e&', lateral                          17.65          ---------  LV e&', medial                             7.29  cm/s     ---------  LV E/e&', medial                           20.03          ---------  LV e&', average                            7.78  cm/s     ---------  LV E/e&', average                          18.77          ---------    Ventricular septum                        Value          Reference  IVS thickness, ED                         10    mm       ---------    LVOT                                      Value          Reference  LVOT ID, S                                21    mm       ---------  LVOT area                                 3.46  cm^2     ---------  LVOT peak velocity, S                     99.2  cm/s     ---------  LVOT mean velocity, S                     68.8  cm/s     ---------  LVOT VTI, S                                24.9  cm       ---------    Aortic valve  Value          Reference  Aortic valve peak velocity, S             433   cm/s     ---------  Aortic valve mean velocity, S             302   cm/s     ---------  Aortic valve VTI, S                       104   cm       ---------  Aortic mean gradient, S                   41    mm Hg    ---------  Aortic peak gradient, S                   75    mm Hg    ---------  VTI ratio, LVOT/AV                        0.24           ---------  Aortic valve area, VTI                    0.83  cm^2     ---------  Aortic valve area/bsa, VTI                0.42  cm^2/m^2 ---------  Velocity ratio, peak, LVOT/AV             0.23           ---------  Aortic valve area, peak velocity          0.79  cm^2     ---------  Aortic valve area/bsa, peak               0.4   cm^2/m^2 ---------  velocity  Velocity ratio, mean, LVOT/AV             0.23           ---------  Aortic valve area, mean velocity          0.79  cm^2     ---------  Aortic valve area/bsa, mean               0.4   cm^2/m^2 ---------  velocity    Aorta                                     Value          Reference  Aortic root ID, ED                        36    mm       ---------  Ascending aorta ID, A-P, S                39    mm       ---------    Left atrium                               Value          Reference  LA ID, A-P, ES  44    mm       ---------  LA ID/bsa, A-P                    (H)     2.22  cm/m^2   <=2.2  LA volume, S                              63    ml       ---------  LA volume/bsa, S                          31.8  ml/m^2   ---------  LA volume, ES, 1-p A4C                    62    ml       ---------  LA volume/bsa, ES, 1-p A4C                31.3  ml/m^2   ---------  LA volume, ES, 1-p A2C                    57.7  ml       ---------  LA volume/bsa, ES, 1-p A2C                29.1  ml/m^2   ---------     Mitral valve                              Value          Reference  Mitral E-wave peak velocity               146   cm/s     ---------  Mitral A-wave peak velocity               146   cm/s     ---------  Mitral deceleration time          (H)     239   ms       150 - 230  Mitral pressure half-time                 70    ms       ---------  Mitral peak gradient, D                   9     mm Hg    ---------  Mitral E/A ratio, peak                    1              ---------  Mitral valve area, PHT, DP                3.14  cm^2     ---------  Mitral valve area/bsa, PHT, DP            1.58  cm^2/m^2 ---------    Pulmonary arteries                        Value          Reference  PA pressure, S, DP                (H)  49    mm Hg    <=30    Tricuspid valve                           Value          Reference  Tricuspid regurg peak velocity            319   cm/s     ---------  Tricuspid peak RV-RA gradient             41    mm Hg    ---------    Systemic veins                            Value          Reference  Estimated CVP                             8     mm Hg    ---------    Right ventricle                           Value          Reference  TAPSE                                     20.9  mm       ---------  RV pressure, S, DP                (H)     49    mm Hg    <=30  RV s&', lateral, S                         12.2  cm/s     ---------  EKG:  EKG is not ordered today. The ekg ordered today demonstrates   Recent Labs: No results found for requested labs within last 8760 hours.   Lipid Panel No results found for: CHOL, TRIG, HDL, CHOLHDL, VLDL, LDLCALC, LDLDIRECT   Wt Readings from Last 3 Encounters:  06/11/18 174 lb (78.9 kg)  06/05/18 172 lb (78 kg)  05/30/18 175 lb 12.8 oz (79.7 kg)     Other studies Reviewed: Additional studies/ records that were reviewed today include: Echo images. Office notes Review of the above records demonstrates: severe AS   Assessment and Plan:    1. Severe aortic stenosis: He has severe, stage D aortic valve stenosis. I have personally reviewed the echo images. The aortic valve is thickened, calcified with limited leaflet mobility. I think he would benefit from AVR. Given advanced age, he is not a good candidate for conventional AVR by surgical approach. I think he may be a good candidate for TAVR.   STS Risk Score:  Procedure: Isolated AVR  Risk of Mortality: 4.076%  Renal Failure: 2.707%  Permanent Stroke: 3.931%  Prolonged Ventilation: 11.039%  DSW Infection: 0.140%  Reoperation: 5.146%  Morbidity or Mortality: 16.413%  Short Length of Stay: 15.821%  Long Length of Stay: 10.042%   I have reviewed the natural history of aortic stenosis with the patient and their family members  who are present today. We have discussed the limitations of medical therapy and the  poor prognosis associated with symptomatic aortic stenosis. We have reviewed potential treatment options, including palliative medical therapy, conventional surgical aortic valve replacement, and transcatheter aortic valve replacement. We discussed treatment options in the context of the patient's specific comorbid medical conditions.   He would like to think more about this before proceeding with planning for TAVR. Risks and benefits of the TAVR procedure reviewed with the patient. He will call back to arrange the cardiac cath with Dr. [name] if he elects to move forward with TAVR planning.    Current medicines are reviewed at length with the patient today.  The patient does not have concerns regarding medicines.  The following changes have been made:  no change  Labs/ tests ordered today include:  No orders of the defined types were placed in this encounter.  Disposition:   FU with the TAVR team once he makes a decision   Signed, Verne Carrow, MD 06/11/2018 4:55 PM    [facility] [address] [place], [place], [place]  [address] Phone: [phone]; Fax: [phone]

## 2018-06-21 ENCOUNTER — Encounter: Payer: Self-pay | Admitting: Interventional Cardiology

## 2018-06-21 ENCOUNTER — Encounter: Payer: Self-pay | Admitting: Cardiothoracic Surgery

## 2018-06-21 DIAGNOSIS — Z23 Encounter for immunization: Secondary | ICD-10-CM | POA: Diagnosis not present

## 2018-06-21 DIAGNOSIS — D539 Nutritional anemia, unspecified: Secondary | ICD-10-CM | POA: Diagnosis not present

## 2018-06-21 DIAGNOSIS — N401 Enlarged prostate with lower urinary tract symptoms: Secondary | ICD-10-CM | POA: Diagnosis not present

## 2018-06-21 DIAGNOSIS — Z950 Presence of cardiac pacemaker: Secondary | ICD-10-CM | POA: Diagnosis not present

## 2018-06-21 DIAGNOSIS — I350 Nonrheumatic aortic (valve) stenosis: Secondary | ICD-10-CM | POA: Diagnosis not present

## 2018-06-21 DIAGNOSIS — R351 Nocturia: Secondary | ICD-10-CM | POA: Diagnosis not present

## 2018-06-21 DIAGNOSIS — I10 Essential (primary) hypertension: Secondary | ICD-10-CM | POA: Diagnosis not present

## 2018-06-21 DIAGNOSIS — I2510 Atherosclerotic heart disease of native coronary artery without angina pectoris: Secondary | ICD-10-CM | POA: Diagnosis not present

## 2018-06-21 DIAGNOSIS — H9193 Unspecified hearing loss, bilateral: Secondary | ICD-10-CM | POA: Diagnosis not present

## 2018-06-21 DIAGNOSIS — R5383 Other fatigue: Secondary | ICD-10-CM | POA: Diagnosis not present

## 2018-06-21 DIAGNOSIS — R42 Dizziness and giddiness: Secondary | ICD-10-CM | POA: Diagnosis not present

## 2018-06-21 DIAGNOSIS — E1169 Type 2 diabetes mellitus with other specified complication: Secondary | ICD-10-CM | POA: Diagnosis not present

## 2018-06-25 DIAGNOSIS — I10 Essential (primary) hypertension: Secondary | ICD-10-CM | POA: Diagnosis not present

## 2018-06-25 DIAGNOSIS — I442 Atrioventricular block, complete: Secondary | ICD-10-CM | POA: Diagnosis not present

## 2018-06-25 DIAGNOSIS — I2510 Atherosclerotic heart disease of native coronary artery without angina pectoris: Secondary | ICD-10-CM | POA: Diagnosis not present

## 2018-06-25 DIAGNOSIS — Z950 Presence of cardiac pacemaker: Secondary | ICD-10-CM | POA: Diagnosis not present

## 2018-06-25 DIAGNOSIS — I350 Nonrheumatic aortic (valve) stenosis: Secondary | ICD-10-CM | POA: Diagnosis not present

## 2018-06-26 ENCOUNTER — Telehealth: Payer: Self-pay

## 2018-06-26 NOTE — Telephone Encounter (Signed)
Thanks Lauren!

## 2018-06-26 NOTE — Telephone Encounter (Signed)
I contacted the pt to answer any additional questions he may have about TAVR from his appointment with Dr Clifton James on 06/11/18.  The pt has made a decision to proceed with additional TAVR evaluation at [facility]. The pt is very appreciative of being able to speak with Dr Clifton James but decided that he would like to seek evaluation closer to home (pt lives in [place]). I advised the pt to feel free to contact [facility] if he had any additional needs.

## 2018-06-27 NOTE — Telephone Encounter (Signed)
Thanks, chris 

## 2018-06-28 DIAGNOSIS — R350 Frequency of micturition: Secondary | ICD-10-CM | POA: Diagnosis not present

## 2018-06-28 DIAGNOSIS — R3915 Urgency of urination: Secondary | ICD-10-CM | POA: Diagnosis not present

## 2018-06-28 DIAGNOSIS — N3941 Urge incontinence: Secondary | ICD-10-CM | POA: Diagnosis not present

## 2018-07-24 DIAGNOSIS — R350 Frequency of micturition: Secondary | ICD-10-CM | POA: Diagnosis not present

## 2018-07-24 DIAGNOSIS — R3915 Urgency of urination: Secondary | ICD-10-CM | POA: Diagnosis not present

## 2018-07-25 DIAGNOSIS — Z7902 Long term (current) use of antithrombotics/antiplatelets: Secondary | ICD-10-CM | POA: Diagnosis not present

## 2018-07-25 DIAGNOSIS — I25810 Atherosclerosis of coronary artery bypass graft(s) without angina pectoris: Secondary | ICD-10-CM | POA: Diagnosis not present

## 2018-07-25 DIAGNOSIS — I371 Nonrheumatic pulmonary valve insufficiency: Secondary | ICD-10-CM | POA: Diagnosis not present

## 2018-07-25 DIAGNOSIS — Z888 Allergy status to other drugs, medicaments and biological substances status: Secondary | ICD-10-CM | POA: Diagnosis not present

## 2018-07-25 DIAGNOSIS — I2582 Chronic total occlusion of coronary artery: Secondary | ICD-10-CM | POA: Diagnosis not present

## 2018-07-25 DIAGNOSIS — Z950 Presence of cardiac pacemaker: Secondary | ICD-10-CM | POA: Diagnosis not present

## 2018-07-25 DIAGNOSIS — R351 Nocturia: Secondary | ICD-10-CM | POA: Diagnosis not present

## 2018-07-25 DIAGNOSIS — I442 Atrioventricular block, complete: Secondary | ICD-10-CM | POA: Diagnosis not present

## 2018-07-25 DIAGNOSIS — Z7982 Long term (current) use of aspirin: Secondary | ICD-10-CM | POA: Diagnosis not present

## 2018-07-25 DIAGNOSIS — Z79899 Other long term (current) drug therapy: Secondary | ICD-10-CM | POA: Diagnosis not present

## 2018-07-25 DIAGNOSIS — I350 Nonrheumatic aortic (valve) stenosis: Secondary | ICD-10-CM | POA: Diagnosis not present

## 2018-07-25 DIAGNOSIS — N401 Enlarged prostate with lower urinary tract symptoms: Secondary | ICD-10-CM | POA: Diagnosis not present

## 2018-07-25 DIAGNOSIS — I2510 Atherosclerotic heart disease of native coronary artery without angina pectoris: Secondary | ICD-10-CM | POA: Diagnosis not present

## 2018-07-25 DIAGNOSIS — I340 Nonrheumatic mitral (valve) insufficiency: Secondary | ICD-10-CM | POA: Diagnosis not present

## 2018-07-25 DIAGNOSIS — I361 Nonrheumatic tricuspid (valve) insufficiency: Secondary | ICD-10-CM | POA: Diagnosis not present

## 2018-07-25 DIAGNOSIS — I088 Other rheumatic multiple valve diseases: Secondary | ICD-10-CM | POA: Diagnosis not present

## 2018-07-25 DIAGNOSIS — Z7984 Long term (current) use of oral hypoglycemic drugs: Secondary | ICD-10-CM | POA: Diagnosis not present

## 2018-07-25 DIAGNOSIS — E785 Hyperlipidemia, unspecified: Secondary | ICD-10-CM | POA: Diagnosis not present

## 2018-07-25 DIAGNOSIS — Z86718 Personal history of other venous thrombosis and embolism: Secondary | ICD-10-CM | POA: Diagnosis not present

## 2018-07-25 DIAGNOSIS — E1165 Type 2 diabetes mellitus with hyperglycemia: Secondary | ICD-10-CM | POA: Diagnosis not present

## 2018-07-26 DIAGNOSIS — Z79899 Other long term (current) drug therapy: Secondary | ICD-10-CM | POA: Diagnosis not present

## 2018-07-26 DIAGNOSIS — E1165 Type 2 diabetes mellitus with hyperglycemia: Secondary | ICD-10-CM | POA: Diagnosis not present

## 2018-07-26 DIAGNOSIS — I25810 Atherosclerosis of coronary artery bypass graft(s) without angina pectoris: Secondary | ICD-10-CM | POA: Diagnosis not present

## 2018-07-26 DIAGNOSIS — I2510 Atherosclerotic heart disease of native coronary artery without angina pectoris: Secondary | ICD-10-CM | POA: Diagnosis not present

## 2018-07-26 DIAGNOSIS — I2582 Chronic total occlusion of coronary artery: Secondary | ICD-10-CM | POA: Diagnosis not present

## 2018-07-26 DIAGNOSIS — N323 Diverticulum of bladder: Secondary | ICD-10-CM | POA: Diagnosis not present

## 2018-07-26 DIAGNOSIS — R932 Abnormal findings on diagnostic imaging of liver and biliary tract: Secondary | ICD-10-CM | POA: Diagnosis not present

## 2018-07-26 DIAGNOSIS — I442 Atrioventricular block, complete: Secondary | ICD-10-CM | POA: Diagnosis not present

## 2018-07-26 DIAGNOSIS — K4020 Bilateral inguinal hernia, without obstruction or gangrene, not specified as recurrent: Secondary | ICD-10-CM | POA: Diagnosis not present

## 2018-07-26 DIAGNOSIS — Z7982 Long term (current) use of aspirin: Secondary | ICD-10-CM | POA: Diagnosis not present

## 2018-07-26 DIAGNOSIS — R351 Nocturia: Secondary | ICD-10-CM | POA: Diagnosis not present

## 2018-07-26 DIAGNOSIS — N401 Enlarged prostate with lower urinary tract symptoms: Secondary | ICD-10-CM | POA: Diagnosis not present

## 2018-07-26 DIAGNOSIS — E785 Hyperlipidemia, unspecified: Secondary | ICD-10-CM | POA: Diagnosis not present

## 2018-07-26 DIAGNOSIS — I350 Nonrheumatic aortic (valve) stenosis: Secondary | ICD-10-CM | POA: Diagnosis not present

## 2018-07-26 DIAGNOSIS — Z7984 Long term (current) use of oral hypoglycemic drugs: Secondary | ICD-10-CM | POA: Diagnosis not present

## 2018-07-26 DIAGNOSIS — Z888 Allergy status to other drugs, medicaments and biological substances status: Secondary | ICD-10-CM | POA: Diagnosis not present

## 2018-07-26 DIAGNOSIS — Z01818 Encounter for other preprocedural examination: Secondary | ICD-10-CM | POA: Diagnosis not present

## 2018-07-26 DIAGNOSIS — I6523 Occlusion and stenosis of bilateral carotid arteries: Secondary | ICD-10-CM | POA: Diagnosis not present

## 2018-07-26 DIAGNOSIS — D3501 Benign neoplasm of right adrenal gland: Secondary | ICD-10-CM | POA: Diagnosis not present

## 2018-07-26 DIAGNOSIS — Z86718 Personal history of other venous thrombosis and embolism: Secondary | ICD-10-CM | POA: Diagnosis not present

## 2018-07-26 DIAGNOSIS — Z950 Presence of cardiac pacemaker: Secondary | ICD-10-CM | POA: Diagnosis not present

## 2018-07-26 DIAGNOSIS — Z7902 Long term (current) use of antithrombotics/antiplatelets: Secondary | ICD-10-CM | POA: Diagnosis not present

## 2018-08-16 DIAGNOSIS — I350 Nonrheumatic aortic (valve) stenosis: Secondary | ICD-10-CM | POA: Diagnosis not present

## 2018-08-17 DIAGNOSIS — E1142 Type 2 diabetes mellitus with diabetic polyneuropathy: Secondary | ICD-10-CM | POA: Diagnosis not present

## 2018-08-23 DIAGNOSIS — R350 Frequency of micturition: Secondary | ICD-10-CM | POA: Diagnosis not present

## 2018-08-23 DIAGNOSIS — N3941 Urge incontinence: Secondary | ICD-10-CM | POA: Diagnosis not present

## 2018-08-29 DIAGNOSIS — L989 Disorder of the skin and subcutaneous tissue, unspecified: Secondary | ICD-10-CM | POA: Diagnosis not present

## 2018-08-29 DIAGNOSIS — I350 Nonrheumatic aortic (valve) stenosis: Secondary | ICD-10-CM | POA: Diagnosis not present

## 2018-08-29 DIAGNOSIS — E1169 Type 2 diabetes mellitus with other specified complication: Secondary | ICD-10-CM | POA: Diagnosis not present

## 2018-08-29 DIAGNOSIS — Z950 Presence of cardiac pacemaker: Secondary | ICD-10-CM | POA: Diagnosis not present

## 2018-08-29 DIAGNOSIS — I2510 Atherosclerotic heart disease of native coronary artery without angina pectoris: Secondary | ICD-10-CM | POA: Diagnosis not present

## 2018-08-29 DIAGNOSIS — I10 Essential (primary) hypertension: Secondary | ICD-10-CM | POA: Diagnosis not present

## 2018-08-29 DIAGNOSIS — R5383 Other fatigue: Secondary | ICD-10-CM | POA: Diagnosis not present

## 2018-09-11 DIAGNOSIS — N3941 Urge incontinence: Secondary | ICD-10-CM | POA: Diagnosis not present

## 2018-09-17 DIAGNOSIS — I272 Pulmonary hypertension, unspecified: Secondary | ICD-10-CM | POA: Diagnosis not present

## 2018-09-17 DIAGNOSIS — Z86718 Personal history of other venous thrombosis and embolism: Secondary | ICD-10-CM | POA: Diagnosis not present

## 2018-09-17 DIAGNOSIS — I517 Cardiomegaly: Secondary | ICD-10-CM | POA: Diagnosis not present

## 2018-09-17 DIAGNOSIS — Z950 Presence of cardiac pacemaker: Secondary | ICD-10-CM | POA: Diagnosis not present

## 2018-09-17 DIAGNOSIS — D62 Acute posthemorrhagic anemia: Secondary | ICD-10-CM | POA: Diagnosis not present

## 2018-09-17 DIAGNOSIS — R351 Nocturia: Secondary | ICD-10-CM | POA: Diagnosis not present

## 2018-09-17 DIAGNOSIS — N323 Diverticulum of bladder: Secondary | ICD-10-CM | POA: Diagnosis not present

## 2018-09-17 DIAGNOSIS — I2510 Atherosclerotic heart disease of native coronary artery without angina pectoris: Secondary | ICD-10-CM | POA: Diagnosis not present

## 2018-09-17 DIAGNOSIS — H9193 Unspecified hearing loss, bilateral: Secondary | ICD-10-CM | POA: Diagnosis not present

## 2018-09-17 DIAGNOSIS — I519 Heart disease, unspecified: Secondary | ICD-10-CM | POA: Diagnosis not present

## 2018-09-17 DIAGNOSIS — E119 Type 2 diabetes mellitus without complications: Secondary | ICD-10-CM | POA: Diagnosis not present

## 2018-09-17 DIAGNOSIS — Z48812 Encounter for surgical aftercare following surgery on the circulatory system: Secondary | ICD-10-CM | POA: Diagnosis not present

## 2018-09-17 DIAGNOSIS — D696 Thrombocytopenia, unspecified: Secondary | ICD-10-CM | POA: Diagnosis not present

## 2018-09-17 DIAGNOSIS — Z006 Encounter for examination for normal comparison and control in clinical research program: Secondary | ICD-10-CM | POA: Diagnosis not present

## 2018-09-17 DIAGNOSIS — Z953 Presence of xenogenic heart valve: Secondary | ICD-10-CM | POA: Diagnosis not present

## 2018-09-17 DIAGNOSIS — I350 Nonrheumatic aortic (valve) stenosis: Secondary | ICD-10-CM | POA: Diagnosis not present

## 2018-09-17 DIAGNOSIS — E785 Hyperlipidemia, unspecified: Secondary | ICD-10-CM | POA: Diagnosis not present

## 2018-09-17 DIAGNOSIS — I088 Other rheumatic multiple valve diseases: Secondary | ICD-10-CM | POA: Diagnosis not present

## 2018-09-17 DIAGNOSIS — I442 Atrioventricular block, complete: Secondary | ICD-10-CM | POA: Diagnosis not present

## 2018-09-17 DIAGNOSIS — R0602 Shortness of breath: Secondary | ICD-10-CM | POA: Diagnosis not present

## 2018-09-17 DIAGNOSIS — I10 Essential (primary) hypertension: Secondary | ICD-10-CM | POA: Diagnosis not present

## 2018-09-17 DIAGNOSIS — Z951 Presence of aortocoronary bypass graft: Secondary | ICD-10-CM | POA: Diagnosis not present

## 2018-09-17 DIAGNOSIS — I70201 Unspecified atherosclerosis of native arteries of extremities, right leg: Secondary | ICD-10-CM | POA: Diagnosis not present

## 2018-09-17 DIAGNOSIS — E1151 Type 2 diabetes mellitus with diabetic peripheral angiopathy without gangrene: Secondary | ICD-10-CM | POA: Diagnosis not present

## 2018-09-17 DIAGNOSIS — N401 Enlarged prostate with lower urinary tract symptoms: Secondary | ICD-10-CM | POA: Diagnosis not present

## 2018-09-24 DIAGNOSIS — Z951 Presence of aortocoronary bypass graft: Secondary | ICD-10-CM | POA: Diagnosis not present

## 2018-09-24 DIAGNOSIS — Z950 Presence of cardiac pacemaker: Secondary | ICD-10-CM | POA: Diagnosis not present

## 2018-09-24 DIAGNOSIS — I10 Essential (primary) hypertension: Secondary | ICD-10-CM | POA: Diagnosis not present

## 2018-09-24 DIAGNOSIS — Z952 Presence of prosthetic heart valve: Secondary | ICD-10-CM | POA: Diagnosis not present

## 2018-09-24 DIAGNOSIS — Z48812 Encounter for surgical aftercare following surgery on the circulatory system: Secondary | ICD-10-CM | POA: Diagnosis not present

## 2018-09-24 DIAGNOSIS — E119 Type 2 diabetes mellitus without complications: Secondary | ICD-10-CM | POA: Diagnosis not present

## 2018-09-24 DIAGNOSIS — I2510 Atherosclerotic heart disease of native coronary artery without angina pectoris: Secondary | ICD-10-CM | POA: Diagnosis not present

## 2018-09-27 DIAGNOSIS — Z953 Presence of xenogenic heart valve: Secondary | ICD-10-CM | POA: Diagnosis not present

## 2018-09-27 DIAGNOSIS — I442 Atrioventricular block, complete: Secondary | ICD-10-CM | POA: Diagnosis not present

## 2018-09-27 DIAGNOSIS — Z950 Presence of cardiac pacemaker: Secondary | ICD-10-CM | POA: Diagnosis not present

## 2018-09-28 DIAGNOSIS — I10 Essential (primary) hypertension: Secondary | ICD-10-CM | POA: Diagnosis not present

## 2018-09-28 DIAGNOSIS — E1169 Type 2 diabetes mellitus with other specified complication: Secondary | ICD-10-CM | POA: Diagnosis not present

## 2018-09-28 DIAGNOSIS — Z953 Presence of xenogenic heart valve: Secondary | ICD-10-CM | POA: Diagnosis not present

## 2018-09-28 DIAGNOSIS — N401 Enlarged prostate with lower urinary tract symptoms: Secondary | ICD-10-CM | POA: Diagnosis not present

## 2018-09-28 DIAGNOSIS — I350 Nonrheumatic aortic (valve) stenosis: Secondary | ICD-10-CM | POA: Diagnosis not present

## 2018-09-28 DIAGNOSIS — I2510 Atherosclerotic heart disease of native coronary artery without angina pectoris: Secondary | ICD-10-CM | POA: Diagnosis not present

## 2018-09-28 DIAGNOSIS — Z950 Presence of cardiac pacemaker: Secondary | ICD-10-CM | POA: Diagnosis not present

## 2018-09-28 DIAGNOSIS — R351 Nocturia: Secondary | ICD-10-CM | POA: Diagnosis not present

## 2018-10-02 DIAGNOSIS — Z48812 Encounter for surgical aftercare following surgery on the circulatory system: Secondary | ICD-10-CM | POA: Diagnosis not present

## 2018-10-02 DIAGNOSIS — Z952 Presence of prosthetic heart valve: Secondary | ICD-10-CM | POA: Diagnosis not present

## 2018-10-04 DIAGNOSIS — Z952 Presence of prosthetic heart valve: Secondary | ICD-10-CM | POA: Diagnosis not present

## 2018-10-18 DIAGNOSIS — Z5189 Encounter for other specified aftercare: Secondary | ICD-10-CM | POA: Diagnosis not present

## 2018-10-18 DIAGNOSIS — Z952 Presence of prosthetic heart valve: Secondary | ICD-10-CM | POA: Diagnosis not present

## 2018-10-19 DIAGNOSIS — N3941 Urge incontinence: Secondary | ICD-10-CM | POA: Diagnosis not present

## 2018-10-19 DIAGNOSIS — R351 Nocturia: Secondary | ICD-10-CM | POA: Diagnosis not present

## 2018-10-19 DIAGNOSIS — N401 Enlarged prostate with lower urinary tract symptoms: Secondary | ICD-10-CM | POA: Diagnosis not present

## 2018-10-24 DIAGNOSIS — R531 Weakness: Secondary | ICD-10-CM | POA: Diagnosis not present

## 2018-10-24 DIAGNOSIS — L981 Factitial dermatitis: Secondary | ICD-10-CM | POA: Diagnosis not present

## 2018-10-24 DIAGNOSIS — Z950 Presence of cardiac pacemaker: Secondary | ICD-10-CM | POA: Diagnosis not present

## 2018-10-24 DIAGNOSIS — I519 Heart disease, unspecified: Secondary | ICD-10-CM | POA: Diagnosis not present

## 2018-10-24 DIAGNOSIS — I517 Cardiomegaly: Secondary | ICD-10-CM | POA: Diagnosis not present

## 2018-10-24 DIAGNOSIS — I442 Atrioventricular block, complete: Secondary | ICD-10-CM | POA: Diagnosis not present

## 2018-10-24 DIAGNOSIS — L821 Other seborrheic keratosis: Secondary | ICD-10-CM | POA: Diagnosis not present

## 2018-10-24 DIAGNOSIS — Z953 Presence of xenogenic heart valve: Secondary | ICD-10-CM | POA: Diagnosis not present

## 2018-10-24 DIAGNOSIS — D485 Neoplasm of uncertain behavior of skin: Secondary | ICD-10-CM | POA: Diagnosis not present

## 2018-10-24 DIAGNOSIS — L579 Skin changes due to chronic exposure to nonionizing radiation, unspecified: Secondary | ICD-10-CM | POA: Diagnosis not present

## 2018-10-24 DIAGNOSIS — I081 Rheumatic disorders of both mitral and tricuspid valves: Secondary | ICD-10-CM | POA: Diagnosis not present

## 2018-10-24 DIAGNOSIS — L814 Other melanin hyperpigmentation: Secondary | ICD-10-CM | POA: Diagnosis not present

## 2018-10-25 DIAGNOSIS — Z952 Presence of prosthetic heart valve: Secondary | ICD-10-CM | POA: Diagnosis not present

## 2018-10-25 DIAGNOSIS — Z5189 Encounter for other specified aftercare: Secondary | ICD-10-CM | POA: Diagnosis not present

## 2018-11-02 DIAGNOSIS — M6289 Other specified disorders of muscle: Secondary | ICD-10-CM | POA: Diagnosis not present

## 2018-11-02 DIAGNOSIS — R29898 Other symptoms and signs involving the musculoskeletal system: Secondary | ICD-10-CM | POA: Diagnosis not present

## 2018-11-04 DIAGNOSIS — E785 Hyperlipidemia, unspecified: Secondary | ICD-10-CM | POA: Diagnosis not present

## 2018-11-04 DIAGNOSIS — N401 Enlarged prostate with lower urinary tract symptoms: Secondary | ICD-10-CM | POA: Diagnosis not present

## 2018-11-04 DIAGNOSIS — J939 Pneumothorax, unspecified: Secondary | ICD-10-CM | POA: Diagnosis not present

## 2018-11-04 DIAGNOSIS — I96 Gangrene, not elsewhere classified: Secondary | ICD-10-CM | POA: Diagnosis not present

## 2018-11-04 DIAGNOSIS — I959 Hypotension, unspecified: Secondary | ICD-10-CM | POA: Diagnosis not present

## 2018-11-04 DIAGNOSIS — Z951 Presence of aortocoronary bypass graft: Secondary | ICD-10-CM | POA: Diagnosis not present

## 2018-11-04 DIAGNOSIS — M6281 Muscle weakness (generalized): Secondary | ICD-10-CM | POA: Diagnosis not present

## 2018-11-04 DIAGNOSIS — Z952 Presence of prosthetic heart valve: Secondary | ICD-10-CM | POA: Diagnosis not present

## 2018-11-04 DIAGNOSIS — S75001D Unspecified injury of femoral artery, right leg, subsequent encounter: Secondary | ICD-10-CM | POA: Diagnosis not present

## 2018-11-04 DIAGNOSIS — T8149XA Infection following a procedure, other surgical site, initial encounter: Secondary | ICD-10-CM | POA: Diagnosis not present

## 2018-11-04 DIAGNOSIS — T8143XA Infection following a procedure, organ and space surgical site, initial encounter: Secondary | ICD-10-CM | POA: Diagnosis not present

## 2018-11-04 DIAGNOSIS — T8132XA Disruption of internal operation (surgical) wound, not elsewhere classified, initial encounter: Secondary | ICD-10-CM | POA: Diagnosis not present

## 2018-11-04 DIAGNOSIS — R42 Dizziness and giddiness: Secondary | ICD-10-CM | POA: Diagnosis not present

## 2018-11-04 DIAGNOSIS — R58 Hemorrhage, not elsewhere classified: Secondary | ICD-10-CM | POA: Diagnosis not present

## 2018-11-04 DIAGNOSIS — R2689 Other abnormalities of gait and mobility: Secondary | ICD-10-CM | POA: Diagnosis not present

## 2018-11-04 DIAGNOSIS — Z953 Presence of xenogenic heart valve: Secondary | ICD-10-CM | POA: Diagnosis not present

## 2018-11-04 DIAGNOSIS — A498 Other bacterial infections of unspecified site: Secondary | ICD-10-CM | POA: Diagnosis not present

## 2018-11-04 DIAGNOSIS — I10 Essential (primary) hypertension: Secondary | ICD-10-CM | POA: Diagnosis not present

## 2018-11-04 DIAGNOSIS — R531 Weakness: Secondary | ICD-10-CM | POA: Diagnosis not present

## 2018-11-04 DIAGNOSIS — E1169 Type 2 diabetes mellitus with other specified complication: Secondary | ICD-10-CM | POA: Diagnosis not present

## 2018-11-04 DIAGNOSIS — Z79899 Other long term (current) drug therapy: Secondary | ICD-10-CM | POA: Diagnosis not present

## 2018-11-04 DIAGNOSIS — R41841 Cognitive communication deficit: Secondary | ICD-10-CM | POA: Diagnosis not present

## 2018-11-04 DIAGNOSIS — S31109D Unspecified open wound of abdominal wall, unspecified quadrant without penetration into peritoneal cavity, subsequent encounter: Secondary | ICD-10-CM | POA: Diagnosis not present

## 2018-11-04 DIAGNOSIS — Z888 Allergy status to other drugs, medicaments and biological substances status: Secondary | ICD-10-CM | POA: Diagnosis not present

## 2018-11-04 DIAGNOSIS — T8131XA Disruption of external operation (surgical) wound, not elsewhere classified, initial encounter: Secondary | ICD-10-CM | POA: Diagnosis not present

## 2018-11-04 DIAGNOSIS — I97618 Postprocedural hemorrhage and hematoma of a circulatory system organ or structure following other circulatory system procedure: Secondary | ICD-10-CM | POA: Diagnosis not present

## 2018-11-04 DIAGNOSIS — R351 Nocturia: Secondary | ICD-10-CM | POA: Diagnosis not present

## 2018-11-04 DIAGNOSIS — I2510 Atherosclerotic heart disease of native coronary artery without angina pectoris: Secondary | ICD-10-CM | POA: Diagnosis not present

## 2018-11-04 DIAGNOSIS — I700 Atherosclerosis of aorta: Secondary | ICD-10-CM | POA: Diagnosis not present

## 2018-11-04 DIAGNOSIS — Z86718 Personal history of other venous thrombosis and embolism: Secondary | ICD-10-CM | POA: Diagnosis not present

## 2018-11-04 DIAGNOSIS — Z481 Encounter for planned postprocedural wound closure: Secondary | ICD-10-CM | POA: Diagnosis not present

## 2018-11-04 DIAGNOSIS — I442 Atrioventricular block, complete: Secondary | ICD-10-CM | POA: Diagnosis not present

## 2018-11-04 DIAGNOSIS — L7622 Postprocedural hemorrhage and hematoma of skin and subcutaneous tissue following other procedure: Secondary | ICD-10-CM | POA: Diagnosis not present

## 2018-11-04 DIAGNOSIS — S75001A Unspecified injury of femoral artery, right leg, initial encounter: Secondary | ICD-10-CM | POA: Diagnosis not present

## 2018-11-04 DIAGNOSIS — R278 Other lack of coordination: Secondary | ICD-10-CM | POA: Diagnosis not present

## 2018-11-04 DIAGNOSIS — T148XXA Other injury of unspecified body region, initial encounter: Secondary | ICD-10-CM | POA: Diagnosis not present

## 2018-11-04 DIAGNOSIS — Z950 Presence of cardiac pacemaker: Secondary | ICD-10-CM | POA: Diagnosis not present

## 2018-11-04 DIAGNOSIS — Z7984 Long term (current) use of oral hypoglycemic drugs: Secondary | ICD-10-CM | POA: Diagnosis not present

## 2018-11-04 DIAGNOSIS — E1165 Type 2 diabetes mellitus with hyperglycemia: Secondary | ICD-10-CM | POA: Diagnosis not present

## 2018-11-04 DIAGNOSIS — D62 Acute posthemorrhagic anemia: Secondary | ICD-10-CM | POA: Diagnosis not present

## 2018-11-04 DIAGNOSIS — I350 Nonrheumatic aortic (valve) stenosis: Secondary | ICD-10-CM | POA: Diagnosis not present

## 2018-11-04 DIAGNOSIS — J90 Pleural effusion, not elsewhere classified: Secondary | ICD-10-CM | POA: Diagnosis not present

## 2018-11-04 DIAGNOSIS — Z7982 Long term (current) use of aspirin: Secondary | ICD-10-CM | POA: Diagnosis not present

## 2018-11-04 DIAGNOSIS — I340 Nonrheumatic mitral (valve) insufficiency: Secondary | ICD-10-CM | POA: Diagnosis not present

## 2018-11-04 DIAGNOSIS — H9190 Unspecified hearing loss, unspecified ear: Secondary | ICD-10-CM | POA: Diagnosis not present

## 2018-11-04 DIAGNOSIS — D649 Anemia, unspecified: Secondary | ICD-10-CM | POA: Diagnosis not present

## 2018-11-04 DIAGNOSIS — Z48812 Encounter for surgical aftercare following surgery on the circulatory system: Secondary | ICD-10-CM | POA: Diagnosis not present

## 2018-11-04 DIAGNOSIS — R269 Unspecified abnormalities of gait and mobility: Secondary | ICD-10-CM | POA: Diagnosis not present

## 2018-11-04 DIAGNOSIS — Z8673 Personal history of transient ischemic attack (TIA), and cerebral infarction without residual deficits: Secondary | ICD-10-CM | POA: Diagnosis not present

## 2018-11-04 DIAGNOSIS — T8189XA Other complications of procedures, not elsewhere classified, initial encounter: Secondary | ICD-10-CM | POA: Diagnosis not present

## 2018-11-04 DIAGNOSIS — R0902 Hypoxemia: Secondary | ICD-10-CM | POA: Diagnosis not present

## 2018-11-08 ENCOUNTER — Encounter: Payer: Self-pay | Admitting: Interventional Cardiology

## 2018-11-27 ENCOUNTER — Ambulatory Visit: Payer: Medicare Other | Admitting: Interventional Cardiology

## 2018-12-05 DIAGNOSIS — N401 Enlarged prostate with lower urinary tract symptoms: Secondary | ICD-10-CM | POA: Diagnosis not present

## 2018-12-05 DIAGNOSIS — E1169 Type 2 diabetes mellitus with other specified complication: Secondary | ICD-10-CM | POA: Diagnosis not present

## 2018-12-05 DIAGNOSIS — R269 Unspecified abnormalities of gait and mobility: Secondary | ICD-10-CM | POA: Diagnosis not present

## 2018-12-05 DIAGNOSIS — D649 Anemia, unspecified: Secondary | ICD-10-CM | POA: Diagnosis not present

## 2018-12-05 DIAGNOSIS — R2689 Other abnormalities of gait and mobility: Secondary | ICD-10-CM | POA: Diagnosis not present

## 2018-12-05 DIAGNOSIS — Z48812 Encounter for surgical aftercare following surgery on the circulatory system: Secondary | ICD-10-CM | POA: Diagnosis not present

## 2018-12-05 DIAGNOSIS — M6281 Muscle weakness (generalized): Secondary | ICD-10-CM | POA: Diagnosis not present

## 2018-12-05 DIAGNOSIS — I350 Nonrheumatic aortic (valve) stenosis: Secondary | ICD-10-CM | POA: Diagnosis not present

## 2018-12-05 DIAGNOSIS — R278 Other lack of coordination: Secondary | ICD-10-CM | POA: Diagnosis not present

## 2018-12-05 DIAGNOSIS — R41841 Cognitive communication deficit: Secondary | ICD-10-CM | POA: Diagnosis not present

## 2018-12-05 DIAGNOSIS — Z953 Presence of xenogenic heart valve: Secondary | ICD-10-CM | POA: Diagnosis not present

## 2018-12-05 DIAGNOSIS — I2510 Atherosclerotic heart disease of native coronary artery without angina pectoris: Secondary | ICD-10-CM | POA: Diagnosis not present

## 2018-12-05 DIAGNOSIS — I10 Essential (primary) hypertension: Secondary | ICD-10-CM | POA: Diagnosis not present

## 2018-12-05 DIAGNOSIS — I442 Atrioventricular block, complete: Secondary | ICD-10-CM | POA: Diagnosis not present

## 2018-12-05 DIAGNOSIS — E785 Hyperlipidemia, unspecified: Secondary | ICD-10-CM | POA: Diagnosis not present

## 2018-12-05 DIAGNOSIS — S31109D Unspecified open wound of abdominal wall, unspecified quadrant without penetration into peritoneal cavity, subsequent encounter: Secondary | ICD-10-CM | POA: Diagnosis not present

## 2018-12-05 DIAGNOSIS — E1165 Type 2 diabetes mellitus with hyperglycemia: Secondary | ICD-10-CM | POA: Diagnosis not present

## 2018-12-05 DIAGNOSIS — Z952 Presence of prosthetic heart valve: Secondary | ICD-10-CM | POA: Diagnosis not present

## 2018-12-05 DIAGNOSIS — R531 Weakness: Secondary | ICD-10-CM | POA: Diagnosis not present

## 2018-12-05 DIAGNOSIS — H9190 Unspecified hearing loss, unspecified ear: Secondary | ICD-10-CM | POA: Diagnosis not present

## 2018-12-14 DIAGNOSIS — N401 Enlarged prostate with lower urinary tract symptoms: Secondary | ICD-10-CM | POA: Diagnosis not present

## 2018-12-14 DIAGNOSIS — Z953 Presence of xenogenic heart valve: Secondary | ICD-10-CM | POA: Diagnosis not present

## 2018-12-14 DIAGNOSIS — I10 Essential (primary) hypertension: Secondary | ICD-10-CM | POA: Diagnosis not present

## 2018-12-14 DIAGNOSIS — E1165 Type 2 diabetes mellitus with hyperglycemia: Secondary | ICD-10-CM | POA: Diagnosis not present

## 2018-12-17 DIAGNOSIS — Z952 Presence of prosthetic heart valve: Secondary | ICD-10-CM | POA: Diagnosis not present

## 2018-12-17 DIAGNOSIS — I442 Atrioventricular block, complete: Secondary | ICD-10-CM | POA: Diagnosis not present

## 2018-12-17 DIAGNOSIS — I2510 Atherosclerotic heart disease of native coronary artery without angina pectoris: Secondary | ICD-10-CM | POA: Diagnosis not present

## 2018-12-20 DIAGNOSIS — Z950 Presence of cardiac pacemaker: Secondary | ICD-10-CM | POA: Diagnosis not present

## 2018-12-20 DIAGNOSIS — N183 Chronic kidney disease, stage 3 (moderate): Secondary | ICD-10-CM | POA: Diagnosis not present

## 2018-12-20 DIAGNOSIS — Z743 Need for continuous supervision: Secondary | ICD-10-CM | POA: Diagnosis not present

## 2018-12-20 DIAGNOSIS — I10 Essential (primary) hypertension: Secondary | ICD-10-CM | POA: Diagnosis not present

## 2018-12-20 DIAGNOSIS — E1165 Type 2 diabetes mellitus with hyperglycemia: Secondary | ICD-10-CM | POA: Diagnosis not present

## 2018-12-20 DIAGNOSIS — E785 Hyperlipidemia, unspecified: Secondary | ICD-10-CM | POA: Diagnosis not present

## 2018-12-20 DIAGNOSIS — B9620 Unspecified Escherichia coli [E. coli] as the cause of diseases classified elsewhere: Secondary | ICD-10-CM | POA: Diagnosis not present

## 2018-12-20 DIAGNOSIS — Z87891 Personal history of nicotine dependence: Secondary | ICD-10-CM | POA: Diagnosis not present

## 2018-12-20 DIAGNOSIS — M1990 Unspecified osteoarthritis, unspecified site: Secondary | ICD-10-CM | POA: Diagnosis not present

## 2018-12-20 DIAGNOSIS — Z951 Presence of aortocoronary bypass graft: Secondary | ICD-10-CM | POA: Diagnosis not present

## 2018-12-20 DIAGNOSIS — T8189XA Other complications of procedures, not elsewhere classified, initial encounter: Secondary | ICD-10-CM | POA: Diagnosis not present

## 2018-12-20 DIAGNOSIS — Z7902 Long term (current) use of antithrombotics/antiplatelets: Secondary | ICD-10-CM | POA: Diagnosis not present

## 2018-12-20 DIAGNOSIS — A4902 Methicillin resistant Staphylococcus aureus infection, unspecified site: Secondary | ICD-10-CM | POA: Diagnosis not present

## 2018-12-20 DIAGNOSIS — R531 Weakness: Secondary | ICD-10-CM | POA: Diagnosis not present

## 2018-12-20 DIAGNOSIS — D539 Nutritional anemia, unspecified: Secondary | ICD-10-CM | POA: Diagnosis not present

## 2018-12-20 DIAGNOSIS — N400 Enlarged prostate without lower urinary tract symptoms: Secondary | ICD-10-CM | POA: Diagnosis not present

## 2018-12-20 DIAGNOSIS — E119 Type 2 diabetes mellitus without complications: Secondary | ICD-10-CM | POA: Diagnosis not present

## 2018-12-20 DIAGNOSIS — I442 Atrioventricular block, complete: Secondary | ICD-10-CM | POA: Diagnosis not present

## 2018-12-20 DIAGNOSIS — R279 Unspecified lack of coordination: Secondary | ICD-10-CM | POA: Diagnosis not present

## 2018-12-20 DIAGNOSIS — T8131XS Disruption of external operation (surgical) wound, not elsewhere classified, sequela: Secondary | ICD-10-CM | POA: Diagnosis not present

## 2018-12-20 DIAGNOSIS — S31109D Unspecified open wound of abdominal wall, unspecified quadrant without penetration into peritoneal cavity, subsequent encounter: Secondary | ICD-10-CM | POA: Diagnosis not present

## 2018-12-20 DIAGNOSIS — E1122 Type 2 diabetes mellitus with diabetic chronic kidney disease: Secondary | ICD-10-CM | POA: Diagnosis not present

## 2018-12-20 DIAGNOSIS — A4901 Methicillin susceptible Staphylococcus aureus infection, unspecified site: Secondary | ICD-10-CM | POA: Diagnosis not present

## 2018-12-20 DIAGNOSIS — Z8673 Personal history of transient ischemic attack (TIA), and cerebral infarction without residual deficits: Secondary | ICD-10-CM | POA: Diagnosis not present

## 2018-12-20 DIAGNOSIS — Z952 Presence of prosthetic heart valve: Secondary | ICD-10-CM | POA: Diagnosis not present

## 2018-12-20 DIAGNOSIS — R05 Cough: Secondary | ICD-10-CM | POA: Diagnosis not present

## 2018-12-20 DIAGNOSIS — L02415 Cutaneous abscess of right lower limb: Secondary | ICD-10-CM | POA: Diagnosis not present

## 2018-12-20 DIAGNOSIS — R5381 Other malaise: Secondary | ICD-10-CM | POA: Diagnosis not present

## 2018-12-20 DIAGNOSIS — Z7982 Long term (current) use of aspirin: Secondary | ICD-10-CM | POA: Diagnosis not present

## 2018-12-20 DIAGNOSIS — Z7984 Long term (current) use of oral hypoglycemic drugs: Secondary | ICD-10-CM | POA: Diagnosis not present

## 2018-12-20 DIAGNOSIS — T8141XA Infection following a procedure, superficial incisional surgical site, initial encounter: Secondary | ICD-10-CM | POA: Diagnosis not present

## 2018-12-20 DIAGNOSIS — B9562 Methicillin resistant Staphylococcus aureus infection as the cause of diseases classified elsewhere: Secondary | ICD-10-CM | POA: Diagnosis not present

## 2018-12-20 DIAGNOSIS — S31103D Unspecified open wound of abdominal wall, right lower quadrant without penetration into peritoneal cavity, subsequent encounter: Secondary | ICD-10-CM | POA: Diagnosis not present

## 2018-12-20 DIAGNOSIS — I350 Nonrheumatic aortic (valve) stenosis: Secondary | ICD-10-CM | POA: Diagnosis not present

## 2018-12-20 DIAGNOSIS — Z953 Presence of xenogenic heart valve: Secondary | ICD-10-CM | POA: Diagnosis not present

## 2018-12-20 DIAGNOSIS — A499 Bacterial infection, unspecified: Secondary | ICD-10-CM | POA: Diagnosis not present

## 2018-12-20 DIAGNOSIS — R262 Difficulty in walking, not elsewhere classified: Secondary | ICD-10-CM | POA: Diagnosis not present

## 2018-12-20 DIAGNOSIS — T8149XA Infection following a procedure, other surgical site, initial encounter: Secondary | ICD-10-CM | POA: Diagnosis not present

## 2018-12-20 DIAGNOSIS — T8141XD Infection following a procedure, superficial incisional surgical site, subsequent encounter: Secondary | ICD-10-CM | POA: Diagnosis not present

## 2018-12-20 DIAGNOSIS — I2510 Atherosclerotic heart disease of native coronary artery without angina pectoris: Secondary | ICD-10-CM | POA: Diagnosis not present

## 2018-12-20 DIAGNOSIS — A498 Other bacterial infections of unspecified site: Secondary | ICD-10-CM | POA: Diagnosis not present

## 2019-01-02 DIAGNOSIS — S31103D Unspecified open wound of abdominal wall, right lower quadrant without penetration into peritoneal cavity, subsequent encounter: Secondary | ICD-10-CM | POA: Diagnosis not present

## 2019-01-02 DIAGNOSIS — L02214 Cutaneous abscess of groin: Secondary | ICD-10-CM | POA: Diagnosis not present

## 2019-01-02 DIAGNOSIS — Z8673 Personal history of transient ischemic attack (TIA), and cerebral infarction without residual deficits: Secondary | ICD-10-CM | POA: Diagnosis not present

## 2019-01-02 DIAGNOSIS — R32 Unspecified urinary incontinence: Secondary | ICD-10-CM | POA: Diagnosis not present

## 2019-01-02 DIAGNOSIS — L89153 Pressure ulcer of sacral region, stage 3: Secondary | ICD-10-CM | POA: Diagnosis not present

## 2019-01-02 DIAGNOSIS — R5381 Other malaise: Secondary | ICD-10-CM | POA: Diagnosis not present

## 2019-01-02 DIAGNOSIS — R262 Difficulty in walking, not elsewhere classified: Secondary | ICD-10-CM | POA: Diagnosis not present

## 2019-01-02 DIAGNOSIS — E785 Hyperlipidemia, unspecified: Secondary | ICD-10-CM | POA: Diagnosis not present

## 2019-01-02 DIAGNOSIS — Z7984 Long term (current) use of oral hypoglycemic drugs: Secondary | ICD-10-CM | POA: Diagnosis not present

## 2019-01-02 DIAGNOSIS — A498 Other bacterial infections of unspecified site: Secondary | ICD-10-CM | POA: Diagnosis not present

## 2019-01-02 DIAGNOSIS — R531 Weakness: Secondary | ICD-10-CM | POA: Diagnosis not present

## 2019-01-02 DIAGNOSIS — N400 Enlarged prostate without lower urinary tract symptoms: Secondary | ICD-10-CM | POA: Diagnosis not present

## 2019-01-02 DIAGNOSIS — D539 Nutritional anemia, unspecified: Secondary | ICD-10-CM | POA: Diagnosis not present

## 2019-01-02 DIAGNOSIS — M1990 Unspecified osteoarthritis, unspecified site: Secondary | ICD-10-CM | POA: Diagnosis not present

## 2019-01-02 DIAGNOSIS — I10 Essential (primary) hypertension: Secondary | ICD-10-CM | POA: Diagnosis not present

## 2019-01-02 DIAGNOSIS — A4902 Methicillin resistant Staphylococcus aureus infection, unspecified site: Secondary | ICD-10-CM | POA: Diagnosis not present

## 2019-01-02 DIAGNOSIS — T8131XS Disruption of external operation (surgical) wound, not elsewhere classified, sequela: Secondary | ICD-10-CM | POA: Diagnosis not present

## 2019-01-02 DIAGNOSIS — Z953 Presence of xenogenic heart valve: Secondary | ICD-10-CM | POA: Diagnosis not present

## 2019-01-02 DIAGNOSIS — Z7902 Long term (current) use of antithrombotics/antiplatelets: Secondary | ICD-10-CM | POA: Diagnosis not present

## 2019-01-02 DIAGNOSIS — Z7982 Long term (current) use of aspirin: Secondary | ICD-10-CM | POA: Diagnosis not present

## 2019-01-02 DIAGNOSIS — Z743 Need for continuous supervision: Secondary | ICD-10-CM | POA: Diagnosis not present

## 2019-01-02 DIAGNOSIS — S31109D Unspecified open wound of abdominal wall, unspecified quadrant without penetration into peritoneal cavity, subsequent encounter: Secondary | ICD-10-CM | POA: Diagnosis not present

## 2019-01-02 DIAGNOSIS — E119 Type 2 diabetes mellitus without complications: Secondary | ICD-10-CM | POA: Diagnosis not present

## 2019-01-02 DIAGNOSIS — R279 Unspecified lack of coordination: Secondary | ICD-10-CM | POA: Diagnosis not present

## 2019-01-02 DIAGNOSIS — Z950 Presence of cardiac pacemaker: Secondary | ICD-10-CM | POA: Diagnosis not present

## 2019-01-02 DIAGNOSIS — A499 Bacterial infection, unspecified: Secondary | ICD-10-CM | POA: Diagnosis not present

## 2019-01-02 DIAGNOSIS — I350 Nonrheumatic aortic (valve) stenosis: Secondary | ICD-10-CM | POA: Diagnosis not present

## 2019-01-02 DIAGNOSIS — I2510 Atherosclerotic heart disease of native coronary artery without angina pectoris: Secondary | ICD-10-CM | POA: Diagnosis not present

## 2019-01-04 DIAGNOSIS — E119 Type 2 diabetes mellitus without complications: Secondary | ICD-10-CM | POA: Diagnosis not present

## 2019-01-04 DIAGNOSIS — R531 Weakness: Secondary | ICD-10-CM | POA: Diagnosis not present

## 2019-01-04 DIAGNOSIS — I350 Nonrheumatic aortic (valve) stenosis: Secondary | ICD-10-CM | POA: Diagnosis not present

## 2019-01-04 DIAGNOSIS — A4902 Methicillin resistant Staphylococcus aureus infection, unspecified site: Secondary | ICD-10-CM | POA: Diagnosis not present

## 2019-01-06 DIAGNOSIS — A4902 Methicillin resistant Staphylococcus aureus infection, unspecified site: Secondary | ICD-10-CM | POA: Diagnosis not present

## 2019-01-06 DIAGNOSIS — E119 Type 2 diabetes mellitus without complications: Secondary | ICD-10-CM | POA: Diagnosis not present

## 2019-01-06 DIAGNOSIS — I350 Nonrheumatic aortic (valve) stenosis: Secondary | ICD-10-CM | POA: Diagnosis not present

## 2019-01-06 DIAGNOSIS — R531 Weakness: Secondary | ICD-10-CM | POA: Diagnosis not present

## 2019-01-09 DIAGNOSIS — R531 Weakness: Secondary | ICD-10-CM | POA: Diagnosis not present

## 2019-01-09 DIAGNOSIS — E119 Type 2 diabetes mellitus without complications: Secondary | ICD-10-CM | POA: Diagnosis not present

## 2019-01-09 DIAGNOSIS — A4902 Methicillin resistant Staphylococcus aureus infection, unspecified site: Secondary | ICD-10-CM | POA: Diagnosis not present

## 2019-01-09 DIAGNOSIS — I350 Nonrheumatic aortic (valve) stenosis: Secondary | ICD-10-CM | POA: Diagnosis not present

## 2019-01-15 DIAGNOSIS — S31109D Unspecified open wound of abdominal wall, unspecified quadrant without penetration into peritoneal cavity, subsequent encounter: Secondary | ICD-10-CM | POA: Diagnosis not present

## 2019-01-15 DIAGNOSIS — R531 Weakness: Secondary | ICD-10-CM | POA: Diagnosis not present

## 2019-01-15 DIAGNOSIS — E119 Type 2 diabetes mellitus without complications: Secondary | ICD-10-CM | POA: Diagnosis not present

## 2019-01-15 DIAGNOSIS — A4902 Methicillin resistant Staphylococcus aureus infection, unspecified site: Secondary | ICD-10-CM | POA: Diagnosis not present

## 2019-01-25 DIAGNOSIS — A4902 Methicillin resistant Staphylococcus aureus infection, unspecified site: Secondary | ICD-10-CM | POA: Diagnosis not present

## 2019-01-25 DIAGNOSIS — S31109D Unspecified open wound of abdominal wall, unspecified quadrant without penetration into peritoneal cavity, subsequent encounter: Secondary | ICD-10-CM | POA: Diagnosis not present

## 2019-01-25 DIAGNOSIS — E119 Type 2 diabetes mellitus without complications: Secondary | ICD-10-CM | POA: Diagnosis not present

## 2019-01-25 DIAGNOSIS — R531 Weakness: Secondary | ICD-10-CM | POA: Diagnosis not present

## 2019-01-29 DIAGNOSIS — S31109D Unspecified open wound of abdominal wall, unspecified quadrant without penetration into peritoneal cavity, subsequent encounter: Secondary | ICD-10-CM | POA: Diagnosis not present

## 2019-01-29 DIAGNOSIS — A4902 Methicillin resistant Staphylococcus aureus infection, unspecified site: Secondary | ICD-10-CM | POA: Diagnosis not present

## 2019-01-29 DIAGNOSIS — E119 Type 2 diabetes mellitus without complications: Secondary | ICD-10-CM | POA: Diagnosis not present

## 2019-01-29 DIAGNOSIS — R531 Weakness: Secondary | ICD-10-CM | POA: Diagnosis not present

## 2019-01-30 DIAGNOSIS — A4902 Methicillin resistant Staphylococcus aureus infection, unspecified site: Secondary | ICD-10-CM | POA: Diagnosis not present

## 2019-01-30 DIAGNOSIS — Z950 Presence of cardiac pacemaker: Secondary | ICD-10-CM | POA: Diagnosis not present

## 2019-01-30 DIAGNOSIS — L02214 Cutaneous abscess of groin: Secondary | ICD-10-CM | POA: Diagnosis not present

## 2019-01-30 DIAGNOSIS — Z953 Presence of xenogenic heart valve: Secondary | ICD-10-CM | POA: Diagnosis not present

## 2019-02-01 ENCOUNTER — Telehealth: Payer: Self-pay

## 2019-02-01 DIAGNOSIS — Z86718 Personal history of other venous thrombosis and embolism: Secondary | ICD-10-CM | POA: Diagnosis not present

## 2019-02-01 DIAGNOSIS — L89153 Pressure ulcer of sacral region, stage 3: Secondary | ICD-10-CM | POA: Diagnosis not present

## 2019-02-01 DIAGNOSIS — Z951 Presence of aortocoronary bypass graft: Secondary | ICD-10-CM | POA: Diagnosis not present

## 2019-02-01 DIAGNOSIS — Z952 Presence of prosthetic heart valve: Secondary | ICD-10-CM | POA: Diagnosis not present

## 2019-02-01 DIAGNOSIS — T827XXD Infection and inflammatory reaction due to other cardiac and vascular devices, implants and grafts, subsequent encounter: Secondary | ICD-10-CM | POA: Diagnosis not present

## 2019-02-01 DIAGNOSIS — E119 Type 2 diabetes mellitus without complications: Secondary | ICD-10-CM | POA: Diagnosis not present

## 2019-02-01 DIAGNOSIS — Z7984 Long term (current) use of oral hypoglycemic drugs: Secondary | ICD-10-CM | POA: Diagnosis not present

## 2019-02-01 DIAGNOSIS — I10 Essential (primary) hypertension: Secondary | ICD-10-CM | POA: Diagnosis not present

## 2019-02-01 DIAGNOSIS — Z950 Presence of cardiac pacemaker: Secondary | ICD-10-CM | POA: Diagnosis not present

## 2019-02-01 DIAGNOSIS — I2510 Atherosclerotic heart disease of native coronary artery without angina pectoris: Secondary | ICD-10-CM | POA: Diagnosis not present

## 2019-02-01 NOTE — Telephone Encounter (Signed)
Virtual Visit Pre-Appointment Phone Call  Steps For Call:  1. Confirm consent - "In the setting of the current Covid19 crisis, you are scheduled for a (phone or video) visit with your provider on (date) at (time).  Just as we do with many in-office visits, in order for you to participate in this visit, we must obtain consent.  If you'd like, I can send this to your mychart (if signed up) or email for you to review.  Otherwise, I can obtain your verbal consent now.  All virtual visits are billed to your insurance company just like a normal visit would be.  By agreeing to a virtual visit, we'd like you to understand that the technology does not allow for your provider to perform an examination, and thus may limit your provider's ability to fully assess your condition. If your provider identifies any concerns that need to be evaluated in person, we will make arrangements to do so.  Finally, though the technology is pretty good, we cannot assure that it will always work on either your or our end, and in the setting of a video visit, we may have to convert it to a phone-only visit.  In either situation, we cannot ensure that we have a secure connection.  Are you willing to proceed?"  Yes  2. Confirm the BEST phone number to call the day of the visit by including in appointment notes  3. Give patient instructions for WebEx/MyChart download to smartphone as below or Doximity/Doxy.me if video visit (depending on what platform provider is using)  4. Advise patient to be prepared with their blood pressure, heart rate, weight, any heart rhythm information, their current medicines, and a piece of paper and pen handy for any instructions they may receive the day of their visit  5. Inform patient they will receive a phone call 15 minutes prior to their appointment time (may be from unknown caller ID) so they should be prepared to answer  6. Confirm that appointment type is correct in Epic appointment notes  (VIDEO vs PHONE)     TELEPHONE CALL NOTE  Gregory Barry has been deemed a candidate for a follow-up tele-health visit to limit community exposure during the Covid-19 pandemic. I spoke with the patient via phone to ensure availability of phone/video source, confirm preferred email & phone number, and discuss instructions and expectations.  I reminded Gregory Barry to be prepared with any vital sign and/or heart rhythm information that could potentially be obtained via home monitoring, at the time of his visit. I reminded Gregory Barry to expect a phone call at the time of his visit if his visit.  Gregory Barry  Rinaldo RatelGarrison, [place] MexicoCMA 02/01/2019 5:03 PM   INSTRUCTIONS FOR DOWNLOADING THE WEBEX APP TO SMARTPHONE  - If Apple, ask patient to go to App Store and type in WebEx in the search bar. Download [employer]Webex Meetings, the blue/green circle. If Android, go to [employer] Play Store and type in [employer]WebEx in the search bar. The app is free but as with any other app downloads, their phone may require them to verify saved payment information or Apple/Android password.  - The patient does NOT have to create an account. - On the day of the visit, the assist will walk the patient through joining the meeting with the meeting number/password.  INSTRUCTIONS FOR DOWNLOADING THE MYCHART APP TO SMARTPHONE  - The patient must first make sure to have activated MyChart and know their login information -  If Apple, go to [employer] and type in MyChart in the search bar and download the app. If Android, ask patient to go to [employer] and type in [place] in the search bar and download the app. The app is free but as with any other app downloads, their phone may require them to verify saved payment information or Apple/Android password.  - The patient will need to then log into the app with their MyChart username and password, and select [facility] as their healthcare provider to link the account. When it is time for your visit, go to  the MyChart app, find appointments, and click Begin Video Visit. Be sure to Select Allow for your device to access the Microphone and Camera for your visit. You will then be connected, and your provider will be with you shortly.  **If they have any issues connecting, or need assistance please contact MyChart service desk [phone])**  **If using a computer, in order to ensure the best quality for their visit they will need to use either of the following Internet Browsers: [employer], or Google Chrome**  IF USING DOXIMITY or DOXY.ME - The patient will receive a link just prior to their visit, either by text or email (to be determined day of appointment depending on if it's doxy.me or Doximity).     FULL LENGTH CONSENT FOR TELE-HEALTH VISIT   I hereby voluntarily request, consent and authorize [facility] and its employed or contracted physicians, physician assistants, nurse practitioners or other licensed health care professionals (the Practitioner), to provide me with telemedicine health care services (the "Services") as deemed necessary by the treating Practitioner. I acknowledge and consent to receive the Services by the Practitioner via telemedicine. I understand that the telemedicine visit will involve communicating with the Practitioner through live audiovisual communication technology and the disclosure of certain medical information by electronic transmission. I acknowledge that I have been given the opportunity to request an in-person assessment or other available alternative prior to the telemedicine visit and am voluntarily participating in the telemedicine visit.  I understand that I have the right to withhold or withdraw my consent to the use of telemedicine in the course of my care at any time, without affecting my right to future care or treatment, and that the Practitioner or I may terminate the telemedicine visit at any time. I understand that I have the right to  inspect all information obtained and/or recorded in the course of the telemedicine visit and may receive copies of available information for a reasonable fee.  I understand that some of the potential risks of receiving the Services via telemedicine include:  Marland Kitchen Delay or interruption in medical evaluation due to technological equipment failure or disruption; . Information transmitted may not be sufficient (e.g. poor resolution of images) to allow for appropriate medical decision making by the Practitioner; and/or  . In rare instances, security protocols could fail, causing a breach of personal health information.  Furthermore, I acknowledge that it is my responsibility to provide information about my medical history, conditions and care that is complete and accurate to the best of my ability. I acknowledge that Practitioner's advice, recommendations, and/or decision may be based on factors not within their control, such as incomplete or inaccurate data provided by me or distortions of diagnostic images or specimens that may result from electronic transmissions. I understand that the practice of medicine is not an exact science and that Practitioner makes no warranties or guarantees regarding treatment  outcomes. I acknowledge that I will receive a copy of this consent concurrently upon execution via email to the email address I last provided but may also request a printed copy by calling the office of [facility].    I understand that my insurance will be billed for this visit.   I have read or had this consent read to me. . I understand the contents of this consent, which adequately explains the benefits and risks of the Services being provided via telemedicine.  . I have been provided ample opportunity to ask questions regarding this consent and the Services and have had my questions answered to my satisfaction. . I give my informed consent for the services to be provided through the use of  telemedicine in my medical care  By participating in this telemedicine visit I agree to the above.

## 2019-02-03 DIAGNOSIS — Z952 Presence of prosthetic heart valve: Secondary | ICD-10-CM | POA: Insufficient documentation

## 2019-02-03 DIAGNOSIS — S75001A Unspecified injury of femoral artery, right leg, initial encounter: Secondary | ICD-10-CM | POA: Insufficient documentation

## 2019-02-03 NOTE — Progress Notes (Addendum)
Virtual Visit via Telephone Note   This visit type was conducted due to national recommendations for restrictions regarding the COVID-19 Pandemic (e.g. social distancing) in an effort to limit this patient's exposure and mitigate transmission in our community.  Due to his co-morbid illnesses, this patient is at least at moderate risk for complications without adequate follow up.  This format is felt to be most appropriate for this patient at this time.  The patient did not have access to video technology/had technical difficulties with video requiring transitioning to audio format only (telephone).  All issues noted in this document were discussed and addressed.  No physical exam could be performed with this format.  Please refer to the patient's chart for his  consent to telehealth for [facility]   Evaluation Performed:  Follow-up visit  Date:  02/04/2019   ID:  Gregory Barry, DOB [birth year], MRN [id number]  Patient Location: Home Provider Location: Office  PCP:  Lesia Hausen, MD  Cardiologist:  No primary care provider on file.  Electrophysiologist:  None   Chief Complaint:  TAVR and post procedure right femoral artery / groin complication  History of Present Illness:    Gregory Barry is a 85 y.o. male with a hx of CAD, hypertension,Aortic stenosis, high grade AV block requiring PPM 2017, previous coronary bypass grafting, hypertension, s/p CVA and TAVR 09/2018 complicated by right groin surgery and infection.  Mohd. is doing well now that he is at home.  States he is been in the hospital or in a nursing facility essentially the last 3 months.  It started after his TAVR in December when he developed a right femoral complication requiring artery repair.  He eventually developed infection and has had debridement and drainage off and on since that time.  The drain is been removed.  He has lower extremity swelling, dyspnea on exertion, but denies orthopnea and PND.  He has not had  palpitations.  He denies chills and fever.  Appetite has improved.  He is happy to be at home.  He denies angina.  The patient does not have symptoms concerning for COVID-19 infection (fever, chills, cough, or new shortness of breath).    Past Medical History:  Diagnosis Date  . Balance problem   . BPH (benign prostatic hyperplasia)   . CAD (coronary artery disease)    With sequential SVG to D#1 and #2, sequential SVG to OM #1 and #2, SVG to PDA, and LIMA to LAD. Patent grafts 2205 and no ischemia by Nuc 09/2009 Gregory Barry)  . Calculus of kidney   . Cataract   . Discoid lupus   . Dizziness    With standing but could not document orthostasis today  . Essential hypertension, benign    followed by Dr. Mendel Ryder - last seen late March, 2016, he wishes for the pt. to have further testing prior to clearing for surgery   . Hearing loss   . Meralgia paresthetica 1997  . Mixed hyperlipidemia   . Overweight(278.02)   . Polyneuropathy in diabetes(357.2)   . Severe aortic stenosis   . Stroke (HCC) 01/2011   R BG CVA on MRI, MRA occluded R vertebral artery  . Type II diabetes mellitus (HCC)    Past Surgical History:  Procedure Laterality Date  . BACK SURGERY    . CORONARY ANGIOPLASTY WITH STENT PLACEMENT     With sequential SVG to D#1 and #2, sequential SVG to OM #1 and #2, SVG to PDA, and  LIMA to LAD. Patent grafts 2205 and no ischemia by Nuc 09/2009 Gregory Barry([facility])  . CORONARY ARTERY BYPASS GRAFT  1997   "CABG X5"  . HEMILAMINOTOMY LUMBAR SPINE  02/12/2015   Decompressive lumbar hemilaminectomy, medial facetectomy, foraminotomies at L1-2 and L2-3 on the left followed by sublaminar decompression  . INGUINAL HERNIA REPAIR Right 1997  . JOINT REPLACEMENT    . LESION REMOVAL     Benign lesions  . LITHOTRIPSY     [name]  . LUMBAR LAMINECTOMY  2006   "Dr. Yetta BarreJones"  . LUMBAR LAMINECTOMY/DECOMPRESSION MICRODISCECTOMY N/A 02/12/2015   Procedure: Laminectomy - Lumbar one -two lumbar two -three;  Surgeon:  Gregory Alertavid S Jones, MD;  Location: [facility];  Service: Neurosurgery;  Laterality: N/A;  . TONSILLECTOMY    . TOTAL KNEE ARTHROPLASTY Left ~ 2005   [name]  . TOTAL KNEE ARTHROPLASTY Right 07/29/11     Current Meds  Medication Sig  . amoxicillin (AMOXIL) 500 MG tablet Take 4 tablets by mouth as needed. Take prior to dental procedures.  Marland Kitchen. atorvastatin (LIPITOR) 40 MG tablet Take 40 mg by mouth daily.  . Calcium Carbonate-Vitamin D (CALTRATE 600+D PO) Take 1 tablet by mouth daily.  . cholecalciferol (VITAMIN D) 1000 UNITS tablet Take 1,000 Units by mouth daily.  . Coenzyme Q-10 200 MG CAPS Take 200 mg by mouth daily.  . finasteride (PROSCAR) 5 MG tablet Take 5 mg by mouth daily.  Marland Kitchen. FREESTYLE LITE test strip 1 strip by Other route 4 (four) times daily. Use 1 strip to check glucose four times a day  . metFORMIN (GLUCOPHAGE) 500 MG tablet Take 500 mg by mouth 2 (two) times daily with a meal.  . metoprolol succinate (TOPROL-XL) 25 MG 24 hr tablet Take 25 mg by mouth 2 (two) times daily.  . mirabegron ER (MYRBETRIQ) 50 MG TB24 tablet Take 50 mg by mouth daily.  . nitroGLYCERIN (NITROSTAT) 0.4 MG SL tablet Place 0.4 mg under the tongue every 5 (five) minutes as needed for chest pain (3 DOSES MAX).  . Omega-3 Fatty Acids (FISH OIL) 1200 MG CAPS Take 1 capsule by mouth 2 (two) times daily.  . polyethylene glycol (MIRALAX / GLYCOLAX) packet Take 17 g by mouth at bedtime as needed for mild constipation.   . tamsulosin (FLOMAX) 0.4 MG CAPS capsule Take 0.4 mg by mouth 2 (two) times daily.   . vitamin B-12 (CYANOCOBALAMIN) 1000 MCG tablet Take 1,000 mcg by mouth daily.     Allergies:   Simvastatin and Sporanos [itraconazole]   Social History   Tobacco Use  . Smoking status: Former Smoker    Packs/day: 1.00    Years: 20.00    Pack years: 20.00    Types: Cigarettes  . Smokeless tobacco: Never Used  . Tobacco comment: "quit smoking cigarettes in 1964"  Substance Use Topics  . Alcohol use: Yes     Alcohol/week: 5.0 standard drinks    Types: 5 Shots of liquor per week    Comment: 02/12/2015 "one, 1 shot cocktail before dinner 5 nights/week"  . Drug use: No     Family Hx: The patient's family history includes Cancer in his maternal grandmother; Congestive Heart Failure in his father and mother; Down syndrome in his brother; Heart failure in his father and another family member; Hypertension in his mother; Pneumonia in his brother; Stroke in his mother and another family member; Thrombosis in his mother.  ROS:   Please see the history of present illness.  He continues to be troubled by a feeling of dizziness in his head.  This preceded implantation of his pacemaker and also the TAVR procedure.  Medication adjustments including removal of antihypertensive therapy prior to TAVR did not improve the lightheaded feeling.  Now status post TAVR with blood pressures above 140 mmHg systolic, this feeling of dizziness impacting his mobility continues. All other systems reviewed and are negative.   Prior CV studies:   The following studies were reviewed today:  I was unable to review the report of the follow-up echocardiogram after TAVR as the document was not available in care everywhere..  Labs/Other Tests and Data Reviewed:    EKG:  No ECG reviewed.  Recent Labs: No results found for requested labs within last 8760 hours.   Recent Lipid Panel No results found for: CHOL, TRIG, HDL, CHOLHDL, LDLCALC, LDLDIRECT  Wt Readings from Last 3 Encounters:  02/04/19 162 lb (73.5 kg)  06/11/18 174 lb (78.9 kg)  06/05/18 172 lb (78 kg)     Objective:    Vital Signs:  BP (!) 152/81   Pulse 82   Temp (!) 97.3 F (36.3 C)   Ht 5\' 7"  (1.702 m)   Wt 162 lb (73.5 kg)   BMI 25.37 kg/m    VITAL SIGNS:  reviewed  ASSESSMENT & PLAN:    1. S/P TAVR (transcatheter aortic valve replacement)   2. Injury of right femoral artery, subsequent encounter   3. Coronary artery disease involving  coronary bypass graft of native heart without angina pectoris   4. Complete heart block (HCC)   5. Chronic diastolic heart failure (HCC)   6. Essential hypertension   7. Mixed hyperlipidemia   8. Type 2 diabetes mellitus with other circulatory complication, with long-term current use of insulin (HCC)   9. Educated About Covid-19 Virus Infection    PLAN:  1. Presumed normal function of the TAVR valve. 2. The patient is status post procedural right femoral artery complication including infection.  This is now healing by primary intent with drain removed.  The patient is back at home. 3. He denies angina pectoris. 4. The pacemaker is followed by Dr. Isabell Jarvis at [facility]. 5. He does have volume overload based upon description.  He may benefit from low-dose diuretic therapy, perhaps Aldactone.  I have requested that he discuss this with cardiologist Dr. Lottie Dawson. 6. Blood pressure this morning is elevated but was performed before taking metoprolol.  At his age target blood pressure should be 140/80 mmHg. 7. Lipids were not discussed 8. Diabetes was not discussed  At his age, he has had difficulty traveling back and forth to [place] from [place].  He has therefore established with Dr.Preli.  I will see him in the future if he so pleases.  At this time it seems most prudent for him to keep his health care close to home to minimize long travel.  Of importance is volume overload and dyspnea on exertion.  DOE is probably a combination of physical deconditioning and diastolic heart failure.  Low-dose diuretic therapy, perhaps Aldactone may help.  COVID-19 Education: The signs and symptoms of COVID-19 were discussed with the patient and how to seek care for testing (follow up with PCP or arrange E-visit).  The importance of social distancing was discussed today.  Time:   Today, I have spent 16 minutes with the patient with telehealth technology discussing the above problems.     Medication  Adjustments/Labs and Tests Ordered: Current medicines are reviewed at  length with the patient today.  Concerns regarding medicines are outlined above.   Tests Ordered: No orders of the defined types were placed in this encounter.   Medication Changes: No orders of the defined types were placed in this encounter.   Disposition:  Follow up prn  Signed, Lesleigh Noe, MD  02/04/2019 1:20 PM    [facility]

## 2019-02-04 ENCOUNTER — Other Ambulatory Visit: Payer: Self-pay

## 2019-02-04 ENCOUNTER — Telehealth (INDEPENDENT_AMBULATORY_CARE_PROVIDER_SITE_OTHER): Payer: Medicare Other | Admitting: Interventional Cardiology

## 2019-02-04 ENCOUNTER — Encounter: Payer: Self-pay | Admitting: Interventional Cardiology

## 2019-02-04 VITALS — BP 152/81 | HR 82 | Temp 97.3°F | Ht 67.0 in | Wt 162.0 lb

## 2019-02-04 DIAGNOSIS — I10 Essential (primary) hypertension: Secondary | ICD-10-CM | POA: Diagnosis not present

## 2019-02-04 DIAGNOSIS — Z953 Presence of xenogenic heart valve: Secondary | ICD-10-CM | POA: Diagnosis not present

## 2019-02-04 DIAGNOSIS — R260 Ataxic gait: Secondary | ICD-10-CM | POA: Diagnosis not present

## 2019-02-04 DIAGNOSIS — D649 Anemia, unspecified: Secondary | ICD-10-CM | POA: Diagnosis not present

## 2019-02-04 DIAGNOSIS — E538 Deficiency of other specified B group vitamins: Secondary | ICD-10-CM | POA: Diagnosis not present

## 2019-02-04 DIAGNOSIS — R5383 Other fatigue: Secondary | ICD-10-CM | POA: Diagnosis not present

## 2019-02-04 DIAGNOSIS — E782 Mixed hyperlipidemia: Secondary | ICD-10-CM

## 2019-02-04 DIAGNOSIS — N401 Enlarged prostate with lower urinary tract symptoms: Secondary | ICD-10-CM | POA: Diagnosis not present

## 2019-02-04 DIAGNOSIS — I5032 Chronic diastolic (congestive) heart failure: Secondary | ICD-10-CM

## 2019-02-04 DIAGNOSIS — R531 Weakness: Secondary | ICD-10-CM | POA: Diagnosis not present

## 2019-02-04 DIAGNOSIS — I442 Atrioventricular block, complete: Secondary | ICD-10-CM | POA: Diagnosis not present

## 2019-02-04 DIAGNOSIS — I25810 Atherosclerosis of coronary artery bypass graft(s) without angina pectoris: Secondary | ICD-10-CM

## 2019-02-04 DIAGNOSIS — R351 Nocturia: Secondary | ICD-10-CM | POA: Diagnosis not present

## 2019-02-04 DIAGNOSIS — Z950 Presence of cardiac pacemaker: Secondary | ICD-10-CM

## 2019-02-04 DIAGNOSIS — S31109S Unspecified open wound of abdominal wall, unspecified quadrant without penetration into peritoneal cavity, sequela: Secondary | ICD-10-CM | POA: Diagnosis not present

## 2019-02-04 DIAGNOSIS — T827XXD Infection and inflammatory reaction due to other cardiac and vascular devices, implants and grafts, subsequent encounter: Secondary | ICD-10-CM

## 2019-02-04 DIAGNOSIS — I2510 Atherosclerotic heart disease of native coronary artery without angina pectoris: Secondary | ICD-10-CM | POA: Diagnosis not present

## 2019-02-04 DIAGNOSIS — Z952 Presence of prosthetic heart valve: Secondary | ICD-10-CM

## 2019-02-04 DIAGNOSIS — Z7189 Other specified counseling: Secondary | ICD-10-CM

## 2019-02-04 DIAGNOSIS — I110 Hypertensive heart disease with heart failure: Secondary | ICD-10-CM | POA: Diagnosis not present

## 2019-02-04 DIAGNOSIS — E1159 Type 2 diabetes mellitus with other circulatory complications: Secondary | ICD-10-CM

## 2019-02-04 DIAGNOSIS — E1165 Type 2 diabetes mellitus with hyperglycemia: Secondary | ICD-10-CM | POA: Diagnosis not present

## 2019-02-04 DIAGNOSIS — S75001D Unspecified injury of femoral artery, right leg, subsequent encounter: Secondary | ICD-10-CM

## 2019-02-04 DIAGNOSIS — Z794 Long term (current) use of insulin: Secondary | ICD-10-CM

## 2019-02-04 NOTE — Patient Instructions (Addendum)
Medication Instructions:  Your physician recommends that you talk with your primary cardiologist about adding a low dose diuretic medication (water pill)  Lab work: None Ordered   Testing/Procedures: None Ordered   Follow-Up: At [facility], you and your health needs are our priority.  As part of our continuing mission to provide you with exceptional heart care, we have created designated Provider Care Teams.  These Care Teams include your primary Cardiologist (physician) and Advanced Practice Providers (APPs -  Physician Assistants and Nurse Practitioners) who all work together to provide you with the care you need, when you need it. You will need a follow up appointment in  As needed.  You may see Dr. [name] or one of the following Advanced Practice Providers on your designated Care Team:   Norma Fredrickson, NP Nada Boozer, NP . Georgie Chard, NP

## 2019-02-05 DIAGNOSIS — I2510 Atherosclerotic heart disease of native coronary artery without angina pectoris: Secondary | ICD-10-CM | POA: Diagnosis not present

## 2019-02-05 DIAGNOSIS — Z953 Presence of xenogenic heart valve: Secondary | ICD-10-CM | POA: Diagnosis not present

## 2019-02-05 DIAGNOSIS — E1165 Type 2 diabetes mellitus with hyperglycemia: Secondary | ICD-10-CM | POA: Diagnosis not present

## 2019-02-05 DIAGNOSIS — D649 Anemia, unspecified: Secondary | ICD-10-CM | POA: Diagnosis not present

## 2019-02-05 DIAGNOSIS — I10 Essential (primary) hypertension: Secondary | ICD-10-CM | POA: Diagnosis not present

## 2019-02-05 DIAGNOSIS — I442 Atrioventricular block, complete: Secondary | ICD-10-CM | POA: Diagnosis not present

## 2019-02-05 DIAGNOSIS — R5383 Other fatigue: Secondary | ICD-10-CM | POA: Diagnosis not present

## 2019-02-05 DIAGNOSIS — E538 Deficiency of other specified B group vitamins: Secondary | ICD-10-CM | POA: Diagnosis not present

## 2019-02-06 ENCOUNTER — Telehealth: Payer: Self-pay | Admitting: Interventional Cardiology

## 2019-02-06 NOTE — Telephone Encounter (Signed)
Spoke with Dr. [name] and he said the pt has been seeing a Cardiologist closer to home, Dr. Lottie Dawson and that is who pt should reach out to since this is who he would see if any issues.  Called pt and left detailed message (ok per DPR) advising of this.

## 2019-02-06 NOTE — Telephone Encounter (Signed)
  patent states he woke up last night with his head hurting, achy feeling down both sides of his neck and heart fluttering for about 15 min. He went back to sleep and woke up feeling okay but is concerned about the episode last night. He would like to discuss this issue.

## 2019-02-08 DIAGNOSIS — E1142 Type 2 diabetes mellitus with diabetic polyneuropathy: Secondary | ICD-10-CM | POA: Diagnosis not present

## 2019-02-21 DIAGNOSIS — R3915 Urgency of urination: Secondary | ICD-10-CM | POA: Diagnosis not present

## 2019-02-21 DIAGNOSIS — N401 Enlarged prostate with lower urinary tract symptoms: Secondary | ICD-10-CM | POA: Diagnosis not present

## 2019-02-21 DIAGNOSIS — N3941 Urge incontinence: Secondary | ICD-10-CM | POA: Diagnosis not present

## 2019-02-21 DIAGNOSIS — R351 Nocturia: Secondary | ICD-10-CM | POA: Diagnosis not present

## 2019-02-28 DIAGNOSIS — T8131XD Disruption of external operation (surgical) wound, not elsewhere classified, subsequent encounter: Secondary | ICD-10-CM | POA: Diagnosis not present

## 2019-02-28 DIAGNOSIS — S31109D Unspecified open wound of abdominal wall, unspecified quadrant without penetration into peritoneal cavity, subsequent encounter: Secondary | ICD-10-CM | POA: Diagnosis not present

## 2019-03-04 DIAGNOSIS — H0288A Meibomian gland dysfunction right eye, upper and lower eyelids: Secondary | ICD-10-CM | POA: Diagnosis not present

## 2019-03-04 DIAGNOSIS — H26493 Other secondary cataract, bilateral: Secondary | ICD-10-CM | POA: Diagnosis not present

## 2019-03-04 DIAGNOSIS — H0288B Meibomian gland dysfunction left eye, upper and lower eyelids: Secondary | ICD-10-CM | POA: Diagnosis not present

## 2019-03-04 DIAGNOSIS — H04123 Dry eye syndrome of bilateral lacrimal glands: Secondary | ICD-10-CM | POA: Diagnosis not present

## 2019-03-05 DIAGNOSIS — Z950 Presence of cardiac pacemaker: Secondary | ICD-10-CM | POA: Diagnosis not present

## 2019-03-05 DIAGNOSIS — I10 Essential (primary) hypertension: Secondary | ICD-10-CM | POA: Diagnosis not present

## 2019-03-05 DIAGNOSIS — I2510 Atherosclerotic heart disease of native coronary artery without angina pectoris: Secondary | ICD-10-CM | POA: Diagnosis not present

## 2019-03-05 DIAGNOSIS — E119 Type 2 diabetes mellitus without complications: Secondary | ICD-10-CM | POA: Diagnosis not present

## 2019-03-05 DIAGNOSIS — L89153 Pressure ulcer of sacral region, stage 3: Secondary | ICD-10-CM | POA: Diagnosis not present

## 2019-03-05 DIAGNOSIS — Z86718 Personal history of other venous thrombosis and embolism: Secondary | ICD-10-CM | POA: Diagnosis not present

## 2019-03-05 DIAGNOSIS — T827XXD Infection and inflammatory reaction due to other cardiac and vascular devices, implants and grafts, subsequent encounter: Secondary | ICD-10-CM | POA: Diagnosis not present

## 2019-03-05 DIAGNOSIS — Z952 Presence of prosthetic heart valve: Secondary | ICD-10-CM | POA: Diagnosis not present

## 2019-03-05 DIAGNOSIS — Z951 Presence of aortocoronary bypass graft: Secondary | ICD-10-CM | POA: Diagnosis not present

## 2019-03-05 DIAGNOSIS — Z7984 Long term (current) use of oral hypoglycemic drugs: Secondary | ICD-10-CM | POA: Diagnosis not present

## 2019-03-08 DIAGNOSIS — E1165 Type 2 diabetes mellitus with hyperglycemia: Secondary | ICD-10-CM | POA: Diagnosis not present

## 2019-03-08 DIAGNOSIS — R42 Dizziness and giddiness: Secondary | ICD-10-CM | POA: Diagnosis not present

## 2019-03-08 DIAGNOSIS — R5383 Other fatigue: Secondary | ICD-10-CM | POA: Diagnosis not present

## 2019-03-08 DIAGNOSIS — R351 Nocturia: Secondary | ICD-10-CM | POA: Diagnosis not present

## 2019-03-08 DIAGNOSIS — R531 Weakness: Secondary | ICD-10-CM | POA: Diagnosis not present

## 2019-03-08 DIAGNOSIS — N401 Enlarged prostate with lower urinary tract symptoms: Secondary | ICD-10-CM | POA: Diagnosis not present

## 2019-03-08 DIAGNOSIS — I10 Essential (primary) hypertension: Secondary | ICD-10-CM | POA: Diagnosis not present

## 2019-03-08 DIAGNOSIS — I442 Atrioventricular block, complete: Secondary | ICD-10-CM | POA: Diagnosis not present

## 2019-03-08 DIAGNOSIS — S31109D Unspecified open wound of abdominal wall, unspecified quadrant without penetration into peritoneal cavity, subsequent encounter: Secondary | ICD-10-CM | POA: Diagnosis not present

## 2019-03-08 DIAGNOSIS — Z953 Presence of xenogenic heart valve: Secondary | ICD-10-CM | POA: Diagnosis not present

## 2019-03-08 DIAGNOSIS — I2510 Atherosclerotic heart disease of native coronary artery without angina pectoris: Secondary | ICD-10-CM | POA: Diagnosis not present

## 2019-03-15 DIAGNOSIS — R350 Frequency of micturition: Secondary | ICD-10-CM | POA: Diagnosis not present

## 2019-03-15 DIAGNOSIS — R3915 Urgency of urination: Secondary | ICD-10-CM | POA: Diagnosis not present

## 2019-03-18 DIAGNOSIS — I10 Essential (primary) hypertension: Secondary | ICD-10-CM | POA: Diagnosis not present

## 2019-03-18 DIAGNOSIS — I442 Atrioventricular block, complete: Secondary | ICD-10-CM | POA: Diagnosis not present

## 2019-03-18 DIAGNOSIS — Z953 Presence of xenogenic heart valve: Secondary | ICD-10-CM | POA: Diagnosis not present

## 2019-03-18 DIAGNOSIS — I2510 Atherosclerotic heart disease of native coronary artery without angina pectoris: Secondary | ICD-10-CM | POA: Diagnosis not present

## 2019-03-28 DIAGNOSIS — R350 Frequency of micturition: Secondary | ICD-10-CM | POA: Diagnosis not present

## 2019-03-28 DIAGNOSIS — R3915 Urgency of urination: Secondary | ICD-10-CM | POA: Diagnosis not present

## 2019-04-09 DIAGNOSIS — H02834 Dermatochalasis of left upper eyelid: Secondary | ICD-10-CM | POA: Diagnosis not present

## 2019-04-09 DIAGNOSIS — H52203 Unspecified astigmatism, bilateral: Secondary | ICD-10-CM | POA: Diagnosis not present

## 2019-04-09 DIAGNOSIS — H11823 Conjunctivochalasis, bilateral: Secondary | ICD-10-CM | POA: Diagnosis not present

## 2019-04-09 DIAGNOSIS — H02831 Dermatochalasis of right upper eyelid: Secondary | ICD-10-CM | POA: Diagnosis not present

## 2019-04-09 DIAGNOSIS — H43391 Other vitreous opacities, right eye: Secondary | ICD-10-CM | POA: Diagnosis not present

## 2019-04-09 DIAGNOSIS — H0100B Unspecified blepharitis left eye, upper and lower eyelids: Secondary | ICD-10-CM | POA: Diagnosis not present

## 2019-04-09 DIAGNOSIS — H04203 Unspecified epiphora, bilateral lacrimal glands: Secondary | ICD-10-CM | POA: Diagnosis not present

## 2019-04-09 DIAGNOSIS — E119 Type 2 diabetes mellitus without complications: Secondary | ICD-10-CM | POA: Diagnosis not present

## 2019-04-09 DIAGNOSIS — Z961 Presence of intraocular lens: Secondary | ICD-10-CM | POA: Diagnosis not present

## 2019-04-09 DIAGNOSIS — H0100A Unspecified blepharitis right eye, upper and lower eyelids: Secondary | ICD-10-CM | POA: Diagnosis not present

## 2019-04-09 DIAGNOSIS — Z7984 Long term (current) use of oral hypoglycemic drugs: Secondary | ICD-10-CM | POA: Diagnosis not present

## 2019-04-09 DIAGNOSIS — H26493 Other secondary cataract, bilateral: Secondary | ICD-10-CM | POA: Diagnosis not present

## 2019-04-09 DIAGNOSIS — H524 Presbyopia: Secondary | ICD-10-CM | POA: Diagnosis not present

## 2019-04-09 DIAGNOSIS — H02102 Unspecified ectropion of right lower eyelid: Secondary | ICD-10-CM | POA: Diagnosis not present

## 2019-04-09 DIAGNOSIS — H18893 Other specified disorders of cornea, bilateral: Secondary | ICD-10-CM | POA: Diagnosis not present

## 2019-04-09 DIAGNOSIS — H04123 Dry eye syndrome of bilateral lacrimal glands: Secondary | ICD-10-CM | POA: Diagnosis not present

## 2019-04-09 DIAGNOSIS — H40053 Ocular hypertension, bilateral: Secondary | ICD-10-CM | POA: Diagnosis not present

## 2019-04-17 DIAGNOSIS — R269 Unspecified abnormalities of gait and mobility: Secondary | ICD-10-CM | POA: Diagnosis not present

## 2019-04-17 DIAGNOSIS — I82622 Acute embolism and thrombosis of deep veins of left upper extremity: Secondary | ICD-10-CM | POA: Diagnosis not present

## 2019-04-18 DIAGNOSIS — R350 Frequency of micturition: Secondary | ICD-10-CM | POA: Diagnosis not present

## 2019-04-18 DIAGNOSIS — N3941 Urge incontinence: Secondary | ICD-10-CM | POA: Diagnosis not present

## 2019-04-18 DIAGNOSIS — R3915 Urgency of urination: Secondary | ICD-10-CM | POA: Diagnosis not present

## 2019-05-09 DIAGNOSIS — R3915 Urgency of urination: Secondary | ICD-10-CM | POA: Diagnosis not present

## 2019-05-09 DIAGNOSIS — R350 Frequency of micturition: Secondary | ICD-10-CM | POA: Diagnosis not present

## 2019-05-13 DIAGNOSIS — R5383 Other fatigue: Secondary | ICD-10-CM | POA: Diagnosis not present

## 2019-05-13 DIAGNOSIS — I10 Essential (primary) hypertension: Secondary | ICD-10-CM | POA: Diagnosis not present

## 2019-05-13 DIAGNOSIS — E1165 Type 2 diabetes mellitus with hyperglycemia: Secondary | ICD-10-CM | POA: Diagnosis not present

## 2019-05-14 DIAGNOSIS — I442 Atrioventricular block, complete: Secondary | ICD-10-CM | POA: Diagnosis not present

## 2019-05-15 DIAGNOSIS — Z48812 Encounter for surgical aftercare following surgery on the circulatory system: Secondary | ICD-10-CM | POA: Diagnosis not present

## 2019-05-15 DIAGNOSIS — S31109D Unspecified open wound of abdominal wall, unspecified quadrant without penetration into peritoneal cavity, subsequent encounter: Secondary | ICD-10-CM | POA: Diagnosis not present

## 2019-05-15 DIAGNOSIS — T8131XD Disruption of external operation (surgical) wound, not elsewhere classified, subsequent encounter: Secondary | ICD-10-CM | POA: Diagnosis not present

## 2019-05-17 DIAGNOSIS — R350 Frequency of micturition: Secondary | ICD-10-CM | POA: Diagnosis not present

## 2019-05-17 DIAGNOSIS — R3915 Urgency of urination: Secondary | ICD-10-CM | POA: Diagnosis not present

## 2019-05-18 DIAGNOSIS — I82622 Acute embolism and thrombosis of deep veins of left upper extremity: Secondary | ICD-10-CM | POA: Diagnosis not present

## 2019-05-18 DIAGNOSIS — R269 Unspecified abnormalities of gait and mobility: Secondary | ICD-10-CM | POA: Diagnosis not present

## 2019-05-24 DIAGNOSIS — R3915 Urgency of urination: Secondary | ICD-10-CM | POA: Diagnosis not present

## 2019-05-24 DIAGNOSIS — R350 Frequency of micturition: Secondary | ICD-10-CM | POA: Diagnosis not present

## 2019-05-24 DIAGNOSIS — N3941 Urge incontinence: Secondary | ICD-10-CM | POA: Diagnosis not present

## 2019-05-30 DIAGNOSIS — R3915 Urgency of urination: Secondary | ICD-10-CM | POA: Diagnosis not present

## 2019-05-30 DIAGNOSIS — R350 Frequency of micturition: Secondary | ICD-10-CM | POA: Diagnosis not present

## 2019-06-06 DIAGNOSIS — R350 Frequency of micturition: Secondary | ICD-10-CM | POA: Diagnosis not present

## 2019-06-06 DIAGNOSIS — R3915 Urgency of urination: Secondary | ICD-10-CM | POA: Diagnosis not present

## 2019-06-13 DIAGNOSIS — R3915 Urgency of urination: Secondary | ICD-10-CM | POA: Diagnosis not present

## 2019-06-13 DIAGNOSIS — R350 Frequency of micturition: Secondary | ICD-10-CM | POA: Diagnosis not present

## 2019-06-13 DIAGNOSIS — N3941 Urge incontinence: Secondary | ICD-10-CM | POA: Diagnosis not present

## 2019-06-17 DIAGNOSIS — R32 Unspecified urinary incontinence: Secondary | ICD-10-CM | POA: Diagnosis not present

## 2019-06-18 DIAGNOSIS — R269 Unspecified abnormalities of gait and mobility: Secondary | ICD-10-CM | POA: Diagnosis not present

## 2019-06-18 DIAGNOSIS — I82622 Acute embolism and thrombosis of deep veins of left upper extremity: Secondary | ICD-10-CM | POA: Diagnosis not present

## 2019-06-20 DIAGNOSIS — R350 Frequency of micturition: Secondary | ICD-10-CM | POA: Diagnosis not present

## 2019-06-20 DIAGNOSIS — R3915 Urgency of urination: Secondary | ICD-10-CM | POA: Diagnosis not present

## 2019-06-26 DIAGNOSIS — N138 Other obstructive and reflux uropathy: Secondary | ICD-10-CM | POA: Diagnosis not present

## 2019-06-26 DIAGNOSIS — N200 Calculus of kidney: Secondary | ICD-10-CM | POA: Diagnosis not present

## 2019-06-26 DIAGNOSIS — N401 Enlarged prostate with lower urinary tract symptoms: Secondary | ICD-10-CM | POA: Diagnosis not present

## 2019-07-04 DIAGNOSIS — R350 Frequency of micturition: Secondary | ICD-10-CM | POA: Diagnosis not present

## 2019-07-04 DIAGNOSIS — R3915 Urgency of urination: Secondary | ICD-10-CM | POA: Diagnosis not present

## 2019-07-09 DIAGNOSIS — R32 Unspecified urinary incontinence: Secondary | ICD-10-CM | POA: Diagnosis not present

## 2019-07-15 DIAGNOSIS — N3941 Urge incontinence: Secondary | ICD-10-CM | POA: Diagnosis not present

## 2019-07-15 DIAGNOSIS — R5381 Other malaise: Secondary | ICD-10-CM | POA: Diagnosis not present

## 2019-07-15 DIAGNOSIS — R2689 Other abnormalities of gait and mobility: Secondary | ICD-10-CM | POA: Diagnosis not present

## 2019-07-15 DIAGNOSIS — N8184 Pelvic muscle wasting: Secondary | ICD-10-CM | POA: Diagnosis not present

## 2019-07-16 DIAGNOSIS — E1165 Type 2 diabetes mellitus with hyperglycemia: Secondary | ICD-10-CM | POA: Diagnosis not present

## 2019-07-16 DIAGNOSIS — I10 Essential (primary) hypertension: Secondary | ICD-10-CM | POA: Diagnosis not present

## 2019-07-16 DIAGNOSIS — E039 Hypothyroidism, unspecified: Secondary | ICD-10-CM | POA: Diagnosis not present

## 2019-07-18 DIAGNOSIS — R269 Unspecified abnormalities of gait and mobility: Secondary | ICD-10-CM | POA: Diagnosis not present

## 2019-07-18 DIAGNOSIS — I82622 Acute embolism and thrombosis of deep veins of left upper extremity: Secondary | ICD-10-CM | POA: Diagnosis not present

## 2019-07-19 DIAGNOSIS — N3941 Urge incontinence: Secondary | ICD-10-CM | POA: Diagnosis not present

## 2019-07-23 DIAGNOSIS — I442 Atrioventricular block, complete: Secondary | ICD-10-CM | POA: Diagnosis not present

## 2019-07-23 DIAGNOSIS — D649 Anemia, unspecified: Secondary | ICD-10-CM | POA: Diagnosis not present

## 2019-07-23 DIAGNOSIS — N1832 Chronic kidney disease, stage 3b: Secondary | ICD-10-CM | POA: Diagnosis not present

## 2019-07-23 DIAGNOSIS — R0602 Shortness of breath: Secondary | ICD-10-CM | POA: Diagnosis not present

## 2019-07-23 DIAGNOSIS — E039 Hypothyroidism, unspecified: Secondary | ICD-10-CM | POA: Diagnosis not present

## 2019-07-23 DIAGNOSIS — E1165 Type 2 diabetes mellitus with hyperglycemia: Secondary | ICD-10-CM | POA: Diagnosis not present

## 2019-07-23 DIAGNOSIS — Z953 Presence of xenogenic heart valve: Secondary | ICD-10-CM | POA: Diagnosis not present

## 2019-07-23 DIAGNOSIS — I350 Nonrheumatic aortic (valve) stenosis: Secondary | ICD-10-CM | POA: Diagnosis not present

## 2019-07-23 DIAGNOSIS — Z23 Encounter for immunization: Secondary | ICD-10-CM | POA: Diagnosis not present

## 2019-07-23 DIAGNOSIS — I2510 Atherosclerotic heart disease of native coronary artery without angina pectoris: Secondary | ICD-10-CM | POA: Diagnosis not present

## 2019-07-23 DIAGNOSIS — I10 Essential (primary) hypertension: Secondary | ICD-10-CM | POA: Diagnosis not present

## 2019-07-23 DIAGNOSIS — R531 Weakness: Secondary | ICD-10-CM | POA: Diagnosis not present

## 2019-07-24 NOTE — Progress Notes (Signed)
Cardiology Office Note:    Date:  07/25/2019   ID:  Gregory Barry, DOB 1920-11-08, MRN [id number]  PCP:  Gregory Hausen, MD  Cardiologist:  No primary care provider on file.   Referring MD: Gregory Hausen, MD   Chief Complaint  Patient presents with  . Coronary Artery Disease  . Cardiac Valve Problem    History of Present Illness:    Gregory Barry is a 85 y.o. male with a hx of CAD, hypertension,Aortic stenosis, high grade AV block requiring PPM 2017, previous coronary bypass grafting 2010, hypertension, s/p CVA and TAVR 09/2018 complicated by right groin surgery and infection.  He has extreme fatigue and weakness with activity.  This started occurring in early summer.  He is short of breath.  He feels dizzy.  His appetite is poor.  He feels exhausted all the time.  He does not have orthopnea, PND, lower extremity edema.  He does have swelling of the left hand dorsum.  CHB (complete heart block) S/P PPM: Functioning appropriately  S/p TAVR (transcatheter aortic valve replacement), bioprosthetic 26 mm Sapien S3 via R TF approach with R CFA repair (endarterectomy & patch angioplasty) 09/18/2018: He will continue to follow-up with Dr. Lottie Barry    Past Medical History:  Diagnosis Date  . Balance problem   . BPH (benign prostatic hyperplasia)   . CAD (coronary artery disease)    With sequential SVG to D#1 and #2, sequential SVG to OM #1 and #2, SVG to PDA, and LIMA to LAD. Patent grafts 2205 and no ischemia by Nuc 09/2009 Gregory Barry)  . Calculus of kidney   . Cataract   . Discoid lupus   . Dizziness    With standing but could not document orthostasis today  . Essential hypertension, benign    followed by Dr. Mendel Barry - last seen late March, 2016, he wishes for the pt. to have further testing prior to clearing for surgery   . Hearing loss   . Meralgia paresthetica 1997  . Mixed hyperlipidemia   . Overweight(278.02)   . Polyneuropathy in diabetes(357.2)   . Severe aortic stenosis    . Stroke (HCC) 01/2011   R BG CVA on MRI, MRA occluded R vertebral artery  . Type II diabetes mellitus (HCC)     Past Surgical History:  Procedure Laterality Date  . BACK SURGERY    . CORONARY ANGIOPLASTY WITH STENT PLACEMENT     With sequential SVG to D#1 and #2, sequential SVG to OM #1 and #2, SVG to PDA, and LIMA to LAD. Patent grafts 2205 and no ischemia by Nuc 09/2009 Gregory Barry)  . CORONARY ARTERY BYPASS GRAFT  1997   "CABG X5"  . HEMILAMINOTOMY LUMBAR SPINE  02/12/2015   Decompressive lumbar hemilaminectomy, medial facetectomy, foraminotomies at L1-2 and L2-3 on the left followed by sublaminar decompression  . INGUINAL HERNIA REPAIR Right 1997  . JOINT REPLACEMENT    . LESION REMOVAL     Benign lesions  . LITHOTRIPSY     Gregory Barry  . LUMBAR LAMINECTOMY  2006   "Gregory Barry"  . LUMBAR LAMINECTOMY/DECOMPRESSION MICRODISCECTOMY N/A 02/12/2015   Procedure: Laminectomy - Lumbar one -two lumbar two -three;  Surgeon: Gregory Alert, MD;  Location: [facility];  Service: Neurosurgery;  Laterality: N/A;  . TONSILLECTOMY    . TOTAL KNEE ARTHROPLASTY Left ~ 2005   Gregory Barry  . TOTAL KNEE ARTHROPLASTY Right 07/29/11    Current Medications: Current Meds  Medication Sig  .  amoxicillin (AMOXIL) 500 MG tablet Take 4 tablets by mouth as needed. Take prior to dental procedures.  . Aspirin Buf,CaCarb-MgCarb-MgO, 81 MG TABS Take 81 mg by mouth daily.  . atorvastatin (LIPITOR) 40 MG tablet Marland Kitchenake 40 mg by mouth daily.  . Calcium Carbonate-Vitamin D (CALTRATE 600+D PO) Take 1 tablet by mouth daily.  . clopidogrel (PLAVIX) 75 MG tablet Take 75 mg by mouth daily.  . finasteride (PROSCAR) 5 MG tablet Take 5 mg by mouth daily.  Marland Kitchen. FREESTYLE LITE test strip 1 strip by Other route 4 (four) times daily. Use 1 strip to check glucose four times a day  . levothyroxine (SYNTHROID) 25 MCG tablet Take 25 mcg by mouth daily.  . metFORMIN (GLUCOPHAGE) 500 MG tablet Take 500 mg by mouth 2 (two) times daily with a  meal.  . metoprolol succinate (TOPROL-XL) 25 MG 24 hr tablet Take 25 mg by mouth 2 (two) times daily.  . mirabegron ER (MYRBETRIQ) 50 MG TB24 tablet Take 50 mg by mouth daily.  . nitroGLYCERIN (NITROSTAT) 0.4 MG SL tablet Place 0.4 mg under the tongue every 5 (five) minutes as needed for chest pain (3 DOSES MAX).  . Omega-3 Fatty Acids (FISH OIL) 1200 MG CAPS Take 1 capsule by mouth 2 (two) times daily.  . pioglitazone (ACTOS) 15 MG tablet Take 15 mg by mouth daily.  . polyethylene glycol (MIRALAX / GLYCOLAX) packet Take 17 g by mouth at bedtime as needed for mild constipation.   . tamsulosin (FLOMAX) 0.4 MG CAPS capsule Take 0.4 mg by mouth 2 (two) times daily.      Allergies:   Simvastatin and Sporanos [itraconazole]   Social History   Socioeconomic History  . Marital status: Married    Spouse name: Not on file  . Number of children: 2  . Years of education: Not on file  . Highest education level: Not on file  Occupational History  . Occupation: Retired Paediatric nurseengineer  Social Needs  . Financial resource strain: Not on file  . Food insecurity    Worry: Not on file    Inability: Not on file  . Transportation needs    Medical: Not on file    Non-medical: Not on file  Tobacco Use  . Smoking status: Former Smoker    Packs/day: 1.00    Years: 20.00    Pack years: 20.00    Types: Cigarettes  . Smokeless tobacco: Never Used  . Tobacco comment: "quit smoking cigarettes in 1964"  Substance and Sexual Activity  . Alcohol use: Yes    Alcohol/week: 5.0 standard drinks    Types: 5 Shots of liquor per week    Comment: 02/12/2015 "one, 1 shot cocktail before dinner 5 nights/week"  . Drug use: No  . Sexual activity: Not Currently  Lifestyle  . Physical activity    Days per week: Not on file    Minutes per session: Not on file  . Stress: Not on file  Relationships  . Social Musicianconnections    Talks on phone: Not on file    Gets together: Not on file    Attends religious service: Not on  file    Active member of club or organization: Not on file    Attends meetings of clubs or organizations: Not on file    Relationship status: Not on file  Other Topics Concern  . Not on file  Social History Narrative  . Not on file     Family History: The patient's family history  includes Cancer in his maternal grandmother; Congestive Heart Failure in his father and mother; Down syndrome in his brother; Heart failure in his father and another family member; Hypertension in his mother; Pneumonia in his brother; Stroke in his mother and another family member; Thrombosis in his mother.  ROS:   Please see the history of present illness.    There is a fever.  Completed 6 weeks of antibiotic therapy related to right groin infection and bacteremia.  All other systems reviewed and are negative.  EKGs/Labs/Other Studies Reviewed:    The following studies were reviewed today: No new data  EKG:  EKG sinus rhythm, first-degree AV block, left axis deviation with inferior Q waves.  Recent Labs: No results found for requested labs within last 8760 hours.  Recent Lipid Panel No results found for: CHOL, TRIG, HDL, CHOLHDL, VLDL, LDLCALC, LDLDIRECT  Physical Exam:    VS:  BP 138/68   Pulse 69   Ht 5\' 7"  (1.702 m)   Wt 172 lb 1.9 oz (78.1 kg)   SpO2 100%   BMI 26.96 kg/m     Wt Readings from Last 3 Encounters:  07/25/19 172 lb 1.9 oz (78.1 kg)  02/04/19 162 lb (73.5 kg)  06/11/18 174 lb (78.9 kg)     GEN: Left hand edema. No acute distress HEENT: Normal NECK: No JVD. LYMPHATICS: No lymphadenopathy CARDIAC: There is a scratchy right upper sternal 1/6 systolic murmur.  RRR without murmur, gallop, or edema. VASCULAR:  Normal Pulses. No bruits. RESPIRATORY:  Clear to auscultation without rales, wheezing or rhonchi  ABDOMEN: Soft, non-tender, non-distended, No pulsatile mass, MUSCULOSKELETAL: No deformity  SKIN: Warm and dry NEUROLOGIC:  Barry and oriented x 3 PSYCHIATRIC:  Normal  affect   ASSESSMENT:    1. S/P TAVR (transcatheter aortic valve replacement)   2. Coronary artery disease involving coronary bypass graft of native heart without angina pectoris   3. Complete heart block (HCC)   4. Chronic diastolic heart failure (HCC)   5. Postoperative wound dehiscence, sequela   6. Presence of permanent cardiac pacemaker   7. Educated about COVID-19 virus infection    PLAN:    In order of problems listed above:  1. I think the TAVR valve is functioning normally.  Primary care wants to have an echo repeated which in speaking with the patient he also wishes to make sure that things are okay.  A 2D Doppler echo will be performed.  I think the TAVR valve and LV function should be in pretty good shape. 2. Not discussed secondary prevention. 3. Normally functioning pacer.  Left hand, dorsal aspect, is chronically swollen and may have to do with venous hypertension related to pacemaker leads which could be causing stenosis or partial obstruction.  I recommend that they discuss this with Dr. 06/13/18. 4. There is no evidence of volume overload. 5. Discussed but not examined 6. He missed last year's appointment because of TAVR and post TAVR complications.  Will try to facilitate an appointment with Dr. Nonnie Done 7. Social distancing, handwashing, and mask wearing is recommended.   8.    Medication Adjustments/Labs and Tests Ordered: Current medicines are reviewed at length with the patient today.  Concerns regarding medicines are outlined above.  No orders of the defined types were placed in this encounter.  No orders of the defined types were placed in this encounter.   There are no Patient Instructions on file for this visit.   Signed, Isabell Jarvis, MD  07/25/2019 9:54 AM    [facility]

## 2019-07-25 ENCOUNTER — Other Ambulatory Visit: Payer: Self-pay

## 2019-07-25 ENCOUNTER — Encounter: Payer: Self-pay | Admitting: Interventional Cardiology

## 2019-07-25 ENCOUNTER — Ambulatory Visit: Payer: Medicare Other | Admitting: Interventional Cardiology

## 2019-07-25 VITALS — BP 138/68 | HR 69 | Ht 67.0 in | Wt 172.1 lb

## 2019-07-25 DIAGNOSIS — I25810 Atherosclerosis of coronary artery bypass graft(s) without angina pectoris: Secondary | ICD-10-CM | POA: Diagnosis not present

## 2019-07-25 DIAGNOSIS — I5032 Chronic diastolic (congestive) heart failure: Secondary | ICD-10-CM

## 2019-07-25 DIAGNOSIS — Z7189 Other specified counseling: Secondary | ICD-10-CM

## 2019-07-25 DIAGNOSIS — I442 Atrioventricular block, complete: Secondary | ICD-10-CM | POA: Diagnosis not present

## 2019-07-25 DIAGNOSIS — T8131XS Disruption of external operation (surgical) wound, not elsewhere classified, sequela: Secondary | ICD-10-CM

## 2019-07-25 DIAGNOSIS — Z952 Presence of prosthetic heart valve: Secondary | ICD-10-CM

## 2019-07-25 DIAGNOSIS — Z950 Presence of cardiac pacemaker: Secondary | ICD-10-CM

## 2019-07-25 NOTE — Patient Instructions (Signed)
Medication Instructions:  Your physician recommends that you continue on your current medications as directed. Please refer to the Current Medication list given to you today.  If you need a refill on your cardiac medications before your next appointment, please call your pharmacy.   Lab work: None If you have labs (blood work) drawn today and your tests are completely normal, you will receive your results only by: Marland Kitchen MyChart Message (if you have MyChart) OR . A paper copy in the mail If you have any lab test that is abnormal or we need to change your treatment, we will call you to review the results.  Testing/Procedures: Your physician has requested that you have an echocardiogram. Echocardiography is a painless test that uses sound waves to create images of your heart. It provides your doctor with information about the size and shape of your heart and how well your heart's chambers and valves are working. This procedure takes approximately one hour. There are no restrictions for this procedure.   Follow-Up: At [facility], you and your health needs are our priority.  As part of our continuing mission to provide you with exceptional heart care, we have created designated Provider Care Teams.  These Care Teams include your primary Cardiologist (physician) and Advanced Practice Providers (APPs -  Physician Assistants and Nurse Practitioners) who all work together to provide you with the care you need, when you need it. You will need a follow up appointment in 12 months.  Please call our office 2 months in advance to schedule this appointment.  You may see Dr. Daneen Schick or one of the following Advanced Practice Providers on your designated Care Team:   Truitt Merle, NP Cecilie Kicks, NP . Kathyrn Drown, NP  Any Other Special Instructions Will Be Listed Below (If Applicable).  I have placed a referral to [facility] for you to get back in to see Dr. Jenene Slicker in Cardiology.

## 2019-07-26 DIAGNOSIS — I442 Atrioventricular block, complete: Secondary | ICD-10-CM | POA: Diagnosis not present

## 2019-07-26 DIAGNOSIS — Z953 Presence of xenogenic heart valve: Secondary | ICD-10-CM | POA: Diagnosis not present

## 2019-07-26 DIAGNOSIS — I2510 Atherosclerotic heart disease of native coronary artery without angina pectoris: Secondary | ICD-10-CM | POA: Diagnosis not present

## 2019-07-26 DIAGNOSIS — I10 Essential (primary) hypertension: Secondary | ICD-10-CM | POA: Diagnosis not present

## 2019-07-30 DIAGNOSIS — Z86718 Personal history of other venous thrombosis and embolism: Secondary | ICD-10-CM | POA: Diagnosis not present

## 2019-07-30 DIAGNOSIS — Z952 Presence of prosthetic heart valve: Secondary | ICD-10-CM | POA: Diagnosis not present

## 2019-07-30 DIAGNOSIS — Z79899 Other long term (current) drug therapy: Secondary | ICD-10-CM | POA: Diagnosis not present

## 2019-07-30 DIAGNOSIS — I10 Essential (primary) hypertension: Secondary | ICD-10-CM | POA: Diagnosis not present

## 2019-07-30 DIAGNOSIS — Z950 Presence of cardiac pacemaker: Secondary | ICD-10-CM | POA: Diagnosis not present

## 2019-07-30 DIAGNOSIS — E119 Type 2 diabetes mellitus without complications: Secondary | ICD-10-CM | POA: Diagnosis not present

## 2019-07-30 DIAGNOSIS — N3000 Acute cystitis without hematuria: Secondary | ICD-10-CM | POA: Diagnosis not present

## 2019-07-30 DIAGNOSIS — K5730 Diverticulosis of large intestine without perforation or abscess without bleeding: Secondary | ICD-10-CM | POA: Diagnosis not present

## 2019-07-30 DIAGNOSIS — Z8673 Personal history of transient ischemic attack (TIA), and cerebral infarction without residual deficits: Secondary | ICD-10-CM | POA: Diagnosis not present

## 2019-07-30 DIAGNOSIS — E785 Hyperlipidemia, unspecified: Secondary | ICD-10-CM | POA: Diagnosis not present

## 2019-07-30 DIAGNOSIS — F17290 Nicotine dependence, other tobacco product, uncomplicated: Secondary | ICD-10-CM | POA: Diagnosis not present

## 2019-07-30 DIAGNOSIS — Z87430 Personal history of prostatic dysplasia: Secondary | ICD-10-CM | POA: Diagnosis not present

## 2019-07-30 DIAGNOSIS — K4090 Unilateral inguinal hernia, without obstruction or gangrene, not specified as recurrent: Secondary | ICD-10-CM | POA: Diagnosis not present

## 2019-07-30 DIAGNOSIS — E039 Hypothyroidism, unspecified: Secondary | ICD-10-CM | POA: Diagnosis not present

## 2019-07-30 DIAGNOSIS — D3501 Benign neoplasm of right adrenal gland: Secondary | ICD-10-CM | POA: Diagnosis not present

## 2019-07-30 DIAGNOSIS — M1990 Unspecified osteoarthritis, unspecified site: Secondary | ICD-10-CM | POA: Diagnosis not present

## 2019-07-30 DIAGNOSIS — N3001 Acute cystitis with hematuria: Secondary | ICD-10-CM | POA: Diagnosis not present

## 2019-07-30 DIAGNOSIS — D539 Nutritional anemia, unspecified: Secondary | ICD-10-CM | POA: Diagnosis not present

## 2019-07-30 DIAGNOSIS — Z951 Presence of aortocoronary bypass graft: Secondary | ICD-10-CM | POA: Diagnosis not present

## 2019-07-30 DIAGNOSIS — I2510 Atherosclerotic heart disease of native coronary artery without angina pectoris: Secondary | ICD-10-CM | POA: Diagnosis not present

## 2019-07-30 DIAGNOSIS — K8020 Calculus of gallbladder without cholecystitis without obstruction: Secondary | ICD-10-CM | POA: Diagnosis not present

## 2019-07-30 DIAGNOSIS — Z7982 Long term (current) use of aspirin: Secondary | ICD-10-CM | POA: Diagnosis not present

## 2019-08-02 ENCOUNTER — Other Ambulatory Visit (HOSPITAL_COMMUNITY): Payer: Medicare Other

## 2019-08-02 ENCOUNTER — Encounter (HOSPITAL_COMMUNITY): Payer: Self-pay

## 2019-08-02 DIAGNOSIS — R310 Gross hematuria: Secondary | ICD-10-CM | POA: Diagnosis not present

## 2019-08-02 DIAGNOSIS — R319 Hematuria, unspecified: Secondary | ICD-10-CM | POA: Diagnosis not present

## 2019-08-07 DIAGNOSIS — I270 Primary pulmonary hypertension: Secondary | ICD-10-CM | POA: Diagnosis not present

## 2019-08-07 DIAGNOSIS — Z953 Presence of xenogenic heart valve: Secondary | ICD-10-CM | POA: Diagnosis not present

## 2019-08-07 DIAGNOSIS — I517 Cardiomegaly: Secondary | ICD-10-CM | POA: Diagnosis not present

## 2019-08-07 DIAGNOSIS — I081 Rheumatic disorders of both mitral and tricuspid valves: Secondary | ICD-10-CM | POA: Diagnosis not present

## 2019-08-07 DIAGNOSIS — I442 Atrioventricular block, complete: Secondary | ICD-10-CM | POA: Diagnosis not present

## 2019-08-07 DIAGNOSIS — I2510 Atherosclerotic heart disease of native coronary artery without angina pectoris: Secondary | ICD-10-CM | POA: Diagnosis not present

## 2019-08-07 DIAGNOSIS — I519 Heart disease, unspecified: Secondary | ICD-10-CM | POA: Diagnosis not present

## 2019-08-07 DIAGNOSIS — I10 Essential (primary) hypertension: Secondary | ICD-10-CM | POA: Diagnosis not present

## 2019-08-07 DIAGNOSIS — Z950 Presence of cardiac pacemaker: Secondary | ICD-10-CM | POA: Diagnosis not present

## 2019-08-08 DIAGNOSIS — N3941 Urge incontinence: Secondary | ICD-10-CM | POA: Diagnosis not present

## 2019-08-13 DIAGNOSIS — K7689 Other specified diseases of liver: Secondary | ICD-10-CM | POA: Diagnosis not present

## 2019-08-13 DIAGNOSIS — K8020 Calculus of gallbladder without cholecystitis without obstruction: Secondary | ICD-10-CM | POA: Diagnosis not present

## 2019-08-13 DIAGNOSIS — R310 Gross hematuria: Secondary | ICD-10-CM | POA: Diagnosis not present

## 2019-08-13 DIAGNOSIS — K862 Cyst of pancreas: Secondary | ICD-10-CM | POA: Diagnosis not present

## 2019-08-15 DIAGNOSIS — R32 Unspecified urinary incontinence: Secondary | ICD-10-CM | POA: Diagnosis not present

## 2019-08-16 DIAGNOSIS — E785 Hyperlipidemia, unspecified: Secondary | ICD-10-CM | POA: Diagnosis not present

## 2019-08-16 DIAGNOSIS — Z7982 Long term (current) use of aspirin: Secondary | ICD-10-CM | POA: Diagnosis not present

## 2019-08-16 DIAGNOSIS — R399 Unspecified symptoms and signs involving the genitourinary system: Secondary | ICD-10-CM | POA: Diagnosis not present

## 2019-08-16 DIAGNOSIS — Z888 Allergy status to other drugs, medicaments and biological substances status: Secondary | ICD-10-CM | POA: Diagnosis not present

## 2019-08-16 DIAGNOSIS — Z79899 Other long term (current) drug therapy: Secondary | ICD-10-CM | POA: Diagnosis not present

## 2019-08-16 DIAGNOSIS — Z87891 Personal history of nicotine dependence: Secondary | ICD-10-CM | POA: Diagnosis not present

## 2019-08-16 DIAGNOSIS — E119 Type 2 diabetes mellitus without complications: Secondary | ICD-10-CM | POA: Diagnosis not present

## 2019-08-16 DIAGNOSIS — R8290 Unspecified abnormal findings in urine: Secondary | ICD-10-CM | POA: Diagnosis not present

## 2019-08-16 DIAGNOSIS — D3500 Benign neoplasm of unspecified adrenal gland: Secondary | ICD-10-CM | POA: Diagnosis not present

## 2019-08-16 DIAGNOSIS — I10 Essential (primary) hypertension: Secondary | ICD-10-CM | POA: Diagnosis not present

## 2019-08-16 DIAGNOSIS — R310 Gross hematuria: Secondary | ICD-10-CM | POA: Diagnosis not present

## 2019-08-18 DIAGNOSIS — I82622 Acute embolism and thrombosis of deep veins of left upper extremity: Secondary | ICD-10-CM | POA: Diagnosis not present

## 2019-08-18 DIAGNOSIS — R269 Unspecified abnormalities of gait and mobility: Secondary | ICD-10-CM | POA: Diagnosis not present

## 2019-08-19 DIAGNOSIS — Z953 Presence of xenogenic heart valve: Secondary | ICD-10-CM | POA: Diagnosis not present

## 2019-08-19 DIAGNOSIS — Z87891 Personal history of nicotine dependence: Secondary | ICD-10-CM | POA: Diagnosis not present

## 2019-08-19 DIAGNOSIS — R310 Gross hematuria: Secondary | ICD-10-CM | POA: Diagnosis not present

## 2019-08-19 DIAGNOSIS — D539 Nutritional anemia, unspecified: Secondary | ICD-10-CM | POA: Diagnosis not present

## 2019-08-19 DIAGNOSIS — N183 Chronic kidney disease, stage 3 unspecified: Secondary | ICD-10-CM | POA: Diagnosis not present

## 2019-08-20 DIAGNOSIS — I442 Atrioventricular block, complete: Secondary | ICD-10-CM | POA: Diagnosis not present

## 2019-08-22 DIAGNOSIS — I10 Essential (primary) hypertension: Secondary | ICD-10-CM | POA: Diagnosis not present

## 2019-08-23 DIAGNOSIS — R3915 Urgency of urination: Secondary | ICD-10-CM | POA: Diagnosis not present

## 2019-08-23 DIAGNOSIS — R350 Frequency of micturition: Secondary | ICD-10-CM | POA: Diagnosis not present

## 2019-08-29 DIAGNOSIS — N401 Enlarged prostate with lower urinary tract symptoms: Secondary | ICD-10-CM | POA: Diagnosis not present

## 2019-08-29 DIAGNOSIS — R531 Weakness: Secondary | ICD-10-CM | POA: Diagnosis not present

## 2019-08-29 DIAGNOSIS — I2510 Atherosclerotic heart disease of native coronary artery without angina pectoris: Secondary | ICD-10-CM | POA: Diagnosis not present

## 2019-08-29 DIAGNOSIS — E039 Hypothyroidism, unspecified: Secondary | ICD-10-CM | POA: Diagnosis not present

## 2019-08-29 DIAGNOSIS — E1165 Type 2 diabetes mellitus with hyperglycemia: Secondary | ICD-10-CM | POA: Diagnosis not present

## 2019-08-29 DIAGNOSIS — Z953 Presence of xenogenic heart valve: Secondary | ICD-10-CM | POA: Diagnosis not present

## 2019-08-29 DIAGNOSIS — N1832 Chronic kidney disease, stage 3b: Secondary | ICD-10-CM | POA: Diagnosis not present

## 2019-08-29 DIAGNOSIS — I10 Essential (primary) hypertension: Secondary | ICD-10-CM | POA: Diagnosis not present

## 2019-08-29 DIAGNOSIS — R351 Nocturia: Secondary | ICD-10-CM | POA: Diagnosis not present

## 2019-08-29 DIAGNOSIS — I442 Atrioventricular block, complete: Secondary | ICD-10-CM | POA: Diagnosis not present

## 2019-08-30 DIAGNOSIS — R310 Gross hematuria: Secondary | ICD-10-CM | POA: Diagnosis not present

## 2019-08-30 DIAGNOSIS — R319 Hematuria, unspecified: Secondary | ICD-10-CM | POA: Diagnosis not present

## 2019-08-30 DIAGNOSIS — N3090 Cystitis, unspecified without hematuria: Secondary | ICD-10-CM | POA: Diagnosis not present

## 2019-09-06 DIAGNOSIS — I82622 Acute embolism and thrombosis of deep veins of left upper extremity: Secondary | ICD-10-CM | POA: Diagnosis not present

## 2019-09-06 DIAGNOSIS — R269 Unspecified abnormalities of gait and mobility: Secondary | ICD-10-CM | POA: Diagnosis not present

## 2019-09-09 DIAGNOSIS — R310 Gross hematuria: Secondary | ICD-10-CM | POA: Diagnosis not present

## 2019-09-09 DIAGNOSIS — N3942 Incontinence without sensory awareness: Secondary | ICD-10-CM | POA: Diagnosis not present

## 2019-09-10 DIAGNOSIS — Z01818 Encounter for other preprocedural examination: Secondary | ICD-10-CM | POA: Diagnosis not present

## 2019-09-10 DIAGNOSIS — E1122 Type 2 diabetes mellitus with diabetic chronic kidney disease: Secondary | ICD-10-CM | POA: Diagnosis not present

## 2019-09-10 DIAGNOSIS — Z953 Presence of xenogenic heart valve: Secondary | ICD-10-CM | POA: Diagnosis not present

## 2019-09-10 DIAGNOSIS — R319 Hematuria, unspecified: Secondary | ICD-10-CM | POA: Diagnosis not present

## 2019-09-17 DIAGNOSIS — R269 Unspecified abnormalities of gait and mobility: Secondary | ICD-10-CM | POA: Diagnosis not present

## 2019-09-17 DIAGNOSIS — I82622 Acute embolism and thrombosis of deep veins of left upper extremity: Secondary | ICD-10-CM | POA: Diagnosis not present

## 2019-09-20 DIAGNOSIS — Z01818 Encounter for other preprocedural examination: Secondary | ICD-10-CM | POA: Diagnosis not present

## 2019-09-23 DIAGNOSIS — E039 Hypothyroidism, unspecified: Secondary | ICD-10-CM | POA: Diagnosis not present

## 2019-09-23 DIAGNOSIS — E1122 Type 2 diabetes mellitus with diabetic chronic kidney disease: Secondary | ICD-10-CM | POA: Diagnosis not present

## 2019-09-23 DIAGNOSIS — I080 Rheumatic disorders of both mitral and aortic valves: Secondary | ICD-10-CM | POA: Diagnosis not present

## 2019-09-23 DIAGNOSIS — E785 Hyperlipidemia, unspecified: Secondary | ICD-10-CM | POA: Diagnosis not present

## 2019-09-23 DIAGNOSIS — R310 Gross hematuria: Secondary | ICD-10-CM | POA: Diagnosis not present

## 2019-09-23 DIAGNOSIS — R42 Dizziness and giddiness: Secondary | ICD-10-CM | POA: Diagnosis not present

## 2019-09-23 DIAGNOSIS — N183 Chronic kidney disease, stage 3 unspecified: Secondary | ICD-10-CM | POA: Diagnosis not present

## 2019-09-23 DIAGNOSIS — I2510 Atherosclerotic heart disease of native coronary artery without angina pectoris: Secondary | ICD-10-CM | POA: Diagnosis not present

## 2019-09-23 DIAGNOSIS — Z951 Presence of aortocoronary bypass graft: Secondary | ICD-10-CM | POA: Diagnosis not present

## 2019-09-23 DIAGNOSIS — N3289 Other specified disorders of bladder: Secondary | ICD-10-CM | POA: Diagnosis not present

## 2019-09-23 DIAGNOSIS — N400 Enlarged prostate without lower urinary tract symptoms: Secondary | ICD-10-CM | POA: Diagnosis not present

## 2019-09-23 DIAGNOSIS — I442 Atrioventricular block, complete: Secondary | ICD-10-CM | POA: Diagnosis not present

## 2019-09-23 DIAGNOSIS — Z8673 Personal history of transient ischemic attack (TIA), and cerebral infarction without residual deficits: Secondary | ICD-10-CM | POA: Diagnosis not present

## 2019-09-23 DIAGNOSIS — Z86718 Personal history of other venous thrombosis and embolism: Secondary | ICD-10-CM | POA: Diagnosis not present

## 2019-09-23 DIAGNOSIS — I129 Hypertensive chronic kidney disease with stage 1 through stage 4 chronic kidney disease, or unspecified chronic kidney disease: Secondary | ICD-10-CM | POA: Diagnosis not present

## 2019-09-23 DIAGNOSIS — Z888 Allergy status to other drugs, medicaments and biological substances status: Secondary | ICD-10-CM | POA: Diagnosis not present

## 2019-09-23 DIAGNOSIS — R531 Weakness: Secondary | ICD-10-CM | POA: Diagnosis not present

## 2019-09-23 DIAGNOSIS — R319 Hematuria, unspecified: Secondary | ICD-10-CM | POA: Diagnosis not present

## 2019-10-03 DIAGNOSIS — N3941 Urge incontinence: Secondary | ICD-10-CM | POA: Diagnosis not present

## 2019-10-15 DIAGNOSIS — N1832 Chronic kidney disease, stage 3b: Secondary | ICD-10-CM | POA: Diagnosis not present

## 2019-10-15 DIAGNOSIS — I10 Essential (primary) hypertension: Secondary | ICD-10-CM | POA: Diagnosis not present

## 2019-10-15 DIAGNOSIS — E1165 Type 2 diabetes mellitus with hyperglycemia: Secondary | ICD-10-CM | POA: Diagnosis not present

## 2019-10-15 DIAGNOSIS — R319 Hematuria, unspecified: Secondary | ICD-10-CM | POA: Diagnosis not present

## 2019-10-15 DIAGNOSIS — I82A12 Acute embolism and thrombosis of left axillary vein: Secondary | ICD-10-CM | POA: Diagnosis not present

## 2019-10-15 DIAGNOSIS — I2510 Atherosclerotic heart disease of native coronary artery without angina pectoris: Secondary | ICD-10-CM | POA: Diagnosis not present

## 2019-10-15 DIAGNOSIS — D649 Anemia, unspecified: Secondary | ICD-10-CM | POA: Diagnosis not present

## 2019-10-15 DIAGNOSIS — Z950 Presence of cardiac pacemaker: Secondary | ICD-10-CM | POA: Diagnosis not present

## 2019-10-15 DIAGNOSIS — E039 Hypothyroidism, unspecified: Secondary | ICD-10-CM | POA: Diagnosis not present

## 2019-10-15 DIAGNOSIS — R1084 Generalized abdominal pain: Secondary | ICD-10-CM | POA: Diagnosis not present

## 2019-10-15 DIAGNOSIS — R42 Dizziness and giddiness: Secondary | ICD-10-CM | POA: Diagnosis not present

## 2019-10-15 DIAGNOSIS — N3001 Acute cystitis with hematuria: Secondary | ICD-10-CM | POA: Diagnosis not present

## 2019-10-15 DIAGNOSIS — Z953 Presence of xenogenic heart valve: Secondary | ICD-10-CM | POA: Diagnosis not present

## 2019-10-22 DIAGNOSIS — T8131XD Disruption of external operation (surgical) wound, not elsewhere classified, subsequent encounter: Secondary | ICD-10-CM | POA: Diagnosis not present

## 2019-10-22 DIAGNOSIS — S31109D Unspecified open wound of abdominal wall, unspecified quadrant without penetration into peritoneal cavity, subsequent encounter: Secondary | ICD-10-CM | POA: Diagnosis not present

## 2019-10-22 DIAGNOSIS — Z48812 Encounter for surgical aftercare following surgery on the circulatory system: Secondary | ICD-10-CM | POA: Diagnosis not present

## 2019-10-24 DIAGNOSIS — N3941 Urge incontinence: Secondary | ICD-10-CM | POA: Diagnosis not present

## 2019-10-24 DIAGNOSIS — R350 Frequency of micturition: Secondary | ICD-10-CM | POA: Diagnosis not present

## 2019-10-24 DIAGNOSIS — R3915 Urgency of urination: Secondary | ICD-10-CM | POA: Diagnosis not present

## 2019-10-29 DIAGNOSIS — E1165 Type 2 diabetes mellitus with hyperglycemia: Secondary | ICD-10-CM | POA: Diagnosis not present

## 2019-10-29 DIAGNOSIS — Z48812 Encounter for surgical aftercare following surgery on the circulatory system: Secondary | ICD-10-CM | POA: Diagnosis not present

## 2019-10-29 DIAGNOSIS — E039 Hypothyroidism, unspecified: Secondary | ICD-10-CM | POA: Diagnosis not present

## 2019-10-29 DIAGNOSIS — I10 Essential (primary) hypertension: Secondary | ICD-10-CM | POA: Diagnosis not present

## 2019-10-29 DIAGNOSIS — E1151 Type 2 diabetes mellitus with diabetic peripheral angiopathy without gangrene: Secondary | ICD-10-CM | POA: Diagnosis not present

## 2019-11-04 DIAGNOSIS — N401 Enlarged prostate with lower urinary tract symptoms: Secondary | ICD-10-CM | POA: Diagnosis not present

## 2019-11-04 DIAGNOSIS — Z953 Presence of xenogenic heart valve: Secondary | ICD-10-CM | POA: Diagnosis not present

## 2019-11-04 DIAGNOSIS — I442 Atrioventricular block, complete: Secondary | ICD-10-CM | POA: Diagnosis not present

## 2019-11-04 DIAGNOSIS — N1832 Chronic kidney disease, stage 3b: Secondary | ICD-10-CM | POA: Diagnosis not present

## 2019-11-04 DIAGNOSIS — I350 Nonrheumatic aortic (valve) stenosis: Secondary | ICD-10-CM | POA: Diagnosis not present

## 2019-11-04 DIAGNOSIS — R351 Nocturia: Secondary | ICD-10-CM | POA: Diagnosis not present

## 2019-11-04 DIAGNOSIS — I2510 Atherosclerotic heart disease of native coronary artery without angina pectoris: Secondary | ICD-10-CM | POA: Diagnosis not present

## 2019-11-04 DIAGNOSIS — E039 Hypothyroidism, unspecified: Secondary | ICD-10-CM | POA: Diagnosis not present

## 2019-11-04 DIAGNOSIS — Z79899 Other long term (current) drug therapy: Secondary | ICD-10-CM | POA: Diagnosis not present

## 2019-11-04 DIAGNOSIS — E1165 Type 2 diabetes mellitus with hyperglycemia: Secondary | ICD-10-CM | POA: Diagnosis not present

## 2019-11-04 DIAGNOSIS — I10 Essential (primary) hypertension: Secondary | ICD-10-CM | POA: Diagnosis not present

## 2019-11-04 DIAGNOSIS — Z950 Presence of cardiac pacemaker: Secondary | ICD-10-CM | POA: Diagnosis not present

## 2019-11-04 DIAGNOSIS — H9193 Unspecified hearing loss, bilateral: Secondary | ICD-10-CM | POA: Diagnosis not present

## 2019-11-04 DIAGNOSIS — R531 Weakness: Secondary | ICD-10-CM | POA: Diagnosis not present

## 2019-11-12 DIAGNOSIS — R05 Cough: Secondary | ICD-10-CM | POA: Diagnosis not present

## 2019-11-12 DIAGNOSIS — Z7901 Long term (current) use of anticoagulants: Secondary | ICD-10-CM | POA: Diagnosis not present

## 2019-11-12 DIAGNOSIS — Z951 Presence of aortocoronary bypass graft: Secondary | ICD-10-CM | POA: Diagnosis not present

## 2019-11-12 DIAGNOSIS — I10 Essential (primary) hypertension: Secondary | ICD-10-CM | POA: Diagnosis not present

## 2019-11-12 DIAGNOSIS — R531 Weakness: Secondary | ICD-10-CM | POA: Diagnosis not present

## 2019-11-12 DIAGNOSIS — E119 Type 2 diabetes mellitus without complications: Secondary | ICD-10-CM | POA: Diagnosis not present

## 2019-11-12 DIAGNOSIS — I2510 Atherosclerotic heart disease of native coronary artery without angina pectoris: Secondary | ICD-10-CM | POA: Diagnosis not present

## 2019-11-12 DIAGNOSIS — R310 Gross hematuria: Secondary | ICD-10-CM | POA: Diagnosis not present

## 2019-11-12 DIAGNOSIS — Z953 Presence of xenogenic heart valve: Secondary | ICD-10-CM | POA: Diagnosis not present

## 2019-11-12 DIAGNOSIS — D539 Nutritional anemia, unspecified: Secondary | ICD-10-CM | POA: Diagnosis not present

## 2019-11-12 DIAGNOSIS — Z952 Presence of prosthetic heart valve: Secondary | ICD-10-CM | POA: Diagnosis not present

## 2019-11-12 DIAGNOSIS — Z8673 Personal history of transient ischemic attack (TIA), and cerebral infarction without residual deficits: Secondary | ICD-10-CM | POA: Diagnosis not present

## 2019-11-14 DIAGNOSIS — R3915 Urgency of urination: Secondary | ICD-10-CM | POA: Diagnosis not present

## 2019-11-14 DIAGNOSIS — R350 Frequency of micturition: Secondary | ICD-10-CM | POA: Diagnosis not present

## 2019-11-20 DIAGNOSIS — Z79899 Other long term (current) drug therapy: Secondary | ICD-10-CM | POA: Diagnosis not present

## 2019-11-20 DIAGNOSIS — I10 Essential (primary) hypertension: Secondary | ICD-10-CM | POA: Diagnosis not present

## 2019-11-21 DIAGNOSIS — N401 Enlarged prostate with lower urinary tract symptoms: Secondary | ICD-10-CM | POA: Diagnosis not present

## 2019-11-21 DIAGNOSIS — Z953 Presence of xenogenic heart valve: Secondary | ICD-10-CM | POA: Diagnosis not present

## 2019-11-21 DIAGNOSIS — E039 Hypothyroidism, unspecified: Secondary | ICD-10-CM | POA: Diagnosis not present

## 2019-11-21 DIAGNOSIS — R0602 Shortness of breath: Secondary | ICD-10-CM | POA: Diagnosis not present

## 2019-11-21 DIAGNOSIS — I350 Nonrheumatic aortic (valve) stenosis: Secondary | ICD-10-CM | POA: Diagnosis not present

## 2019-11-21 DIAGNOSIS — D649 Anemia, unspecified: Secondary | ICD-10-CM | POA: Diagnosis not present

## 2019-11-21 DIAGNOSIS — R351 Nocturia: Secondary | ICD-10-CM | POA: Diagnosis not present

## 2019-11-21 DIAGNOSIS — R918 Other nonspecific abnormal finding of lung field: Secondary | ICD-10-CM | POA: Diagnosis not present

## 2019-11-21 DIAGNOSIS — E1165 Type 2 diabetes mellitus with hyperglycemia: Secondary | ICD-10-CM | POA: Diagnosis not present

## 2019-11-21 DIAGNOSIS — I2510 Atherosclerotic heart disease of native coronary artery without angina pectoris: Secondary | ICD-10-CM | POA: Diagnosis not present

## 2019-11-21 DIAGNOSIS — N1832 Chronic kidney disease, stage 3b: Secondary | ICD-10-CM | POA: Diagnosis not present

## 2019-11-21 DIAGNOSIS — I10 Essential (primary) hypertension: Secondary | ICD-10-CM | POA: Diagnosis not present

## 2019-11-21 DIAGNOSIS — Z951 Presence of aortocoronary bypass graft: Secondary | ICD-10-CM | POA: Diagnosis not present

## 2019-11-21 DIAGNOSIS — R531 Weakness: Secondary | ICD-10-CM | POA: Diagnosis not present

## 2019-12-06 DIAGNOSIS — R3915 Urgency of urination: Secondary | ICD-10-CM | POA: Diagnosis not present

## 2019-12-06 DIAGNOSIS — N3941 Urge incontinence: Secondary | ICD-10-CM | POA: Diagnosis not present

## 2019-12-06 DIAGNOSIS — R350 Frequency of micturition: Secondary | ICD-10-CM | POA: Diagnosis not present

## 2019-12-13 DIAGNOSIS — H524 Presbyopia: Secondary | ICD-10-CM | POA: Diagnosis not present

## 2019-12-19 DIAGNOSIS — N3941 Urge incontinence: Secondary | ICD-10-CM | POA: Diagnosis not present

## 2019-12-23 DIAGNOSIS — R310 Gross hematuria: Secondary | ICD-10-CM | POA: Diagnosis not present

## 2020-01-09 DIAGNOSIS — R350 Frequency of micturition: Secondary | ICD-10-CM | POA: Diagnosis not present

## 2020-01-13 DIAGNOSIS — I10 Essential (primary) hypertension: Secondary | ICD-10-CM | POA: Diagnosis not present

## 2020-01-13 DIAGNOSIS — E039 Hypothyroidism, unspecified: Secondary | ICD-10-CM | POA: Diagnosis not present

## 2020-01-13 DIAGNOSIS — E1165 Type 2 diabetes mellitus with hyperglycemia: Secondary | ICD-10-CM | POA: Diagnosis not present

## 2020-01-20 DIAGNOSIS — N401 Enlarged prostate with lower urinary tract symptoms: Secondary | ICD-10-CM | POA: Diagnosis not present

## 2020-01-20 DIAGNOSIS — I10 Essential (primary) hypertension: Secondary | ICD-10-CM | POA: Diagnosis not present

## 2020-01-20 DIAGNOSIS — D539 Nutritional anemia, unspecified: Secondary | ICD-10-CM | POA: Diagnosis not present

## 2020-01-20 DIAGNOSIS — Z953 Presence of xenogenic heart valve: Secondary | ICD-10-CM | POA: Diagnosis not present

## 2020-01-20 DIAGNOSIS — R351 Nocturia: Secondary | ICD-10-CM | POA: Diagnosis not present

## 2020-01-20 DIAGNOSIS — I350 Nonrheumatic aortic (valve) stenosis: Secondary | ICD-10-CM | POA: Diagnosis not present

## 2020-01-20 DIAGNOSIS — E1165 Type 2 diabetes mellitus with hyperglycemia: Secondary | ICD-10-CM | POA: Diagnosis not present

## 2020-01-20 DIAGNOSIS — E039 Hypothyroidism, unspecified: Secondary | ICD-10-CM | POA: Diagnosis not present

## 2020-01-20 DIAGNOSIS — N1832 Chronic kidney disease, stage 3b: Secondary | ICD-10-CM | POA: Diagnosis not present

## 2020-01-20 DIAGNOSIS — R531 Weakness: Secondary | ICD-10-CM | POA: Diagnosis not present

## 2020-01-20 DIAGNOSIS — Z950 Presence of cardiac pacemaker: Secondary | ICD-10-CM | POA: Diagnosis not present

## 2020-01-21 DIAGNOSIS — Z48812 Encounter for surgical aftercare following surgery on the circulatory system: Secondary | ICD-10-CM | POA: Diagnosis not present

## 2020-01-21 DIAGNOSIS — T8131XD Disruption of external operation (surgical) wound, not elsewhere classified, subsequent encounter: Secondary | ICD-10-CM | POA: Diagnosis not present

## 2020-01-21 DIAGNOSIS — S31109D Unspecified open wound of abdominal wall, unspecified quadrant without penetration into peritoneal cavity, subsequent encounter: Secondary | ICD-10-CM | POA: Diagnosis not present

## 2020-01-23 DIAGNOSIS — R3915 Urgency of urination: Secondary | ICD-10-CM | POA: Diagnosis not present

## 2020-01-23 DIAGNOSIS — R350 Frequency of micturition: Secondary | ICD-10-CM | POA: Diagnosis not present

## 2020-02-06 DIAGNOSIS — R3915 Urgency of urination: Secondary | ICD-10-CM | POA: Diagnosis not present

## 2020-02-06 DIAGNOSIS — R350 Frequency of micturition: Secondary | ICD-10-CM | POA: Diagnosis not present

## 2020-02-10 DIAGNOSIS — T8131XD Disruption of external operation (surgical) wound, not elsewhere classified, subsequent encounter: Secondary | ICD-10-CM | POA: Diagnosis not present

## 2020-02-10 DIAGNOSIS — S31109D Unspecified open wound of abdominal wall, unspecified quadrant without penetration into peritoneal cavity, subsequent encounter: Secondary | ICD-10-CM | POA: Diagnosis not present

## 2020-02-20 DIAGNOSIS — R350 Frequency of micturition: Secondary | ICD-10-CM | POA: Diagnosis not present

## 2020-02-20 DIAGNOSIS — N3941 Urge incontinence: Secondary | ICD-10-CM | POA: Diagnosis not present

## 2020-02-20 DIAGNOSIS — R3915 Urgency of urination: Secondary | ICD-10-CM | POA: Diagnosis not present

## 2020-02-26 DIAGNOSIS — I2510 Atherosclerotic heart disease of native coronary artery without angina pectoris: Secondary | ICD-10-CM | POA: Diagnosis not present

## 2020-02-26 DIAGNOSIS — I442 Atrioventricular block, complete: Secondary | ICD-10-CM | POA: Diagnosis not present

## 2020-03-05 DIAGNOSIS — R3915 Urgency of urination: Secondary | ICD-10-CM | POA: Diagnosis not present

## 2020-03-05 DIAGNOSIS — N3941 Urge incontinence: Secondary | ICD-10-CM | POA: Diagnosis not present

## 2020-03-13 DIAGNOSIS — E039 Hypothyroidism, unspecified: Secondary | ICD-10-CM | POA: Diagnosis not present

## 2020-03-13 DIAGNOSIS — E1165 Type 2 diabetes mellitus with hyperglycemia: Secondary | ICD-10-CM | POA: Diagnosis not present

## 2020-03-13 DIAGNOSIS — I10 Essential (primary) hypertension: Secondary | ICD-10-CM | POA: Diagnosis not present

## 2020-03-19 DIAGNOSIS — R350 Frequency of micturition: Secondary | ICD-10-CM | POA: Diagnosis not present

## 2020-03-19 DIAGNOSIS — R3915 Urgency of urination: Secondary | ICD-10-CM | POA: Diagnosis not present

## 2020-03-20 DIAGNOSIS — H539 Unspecified visual disturbance: Secondary | ICD-10-CM | POA: Diagnosis not present

## 2020-03-20 DIAGNOSIS — N1832 Chronic kidney disease, stage 3b: Secondary | ICD-10-CM | POA: Diagnosis not present

## 2020-03-20 DIAGNOSIS — I10 Essential (primary) hypertension: Secondary | ICD-10-CM | POA: Diagnosis not present

## 2020-03-20 DIAGNOSIS — D649 Anemia, unspecified: Secondary | ICD-10-CM | POA: Diagnosis not present

## 2020-03-20 DIAGNOSIS — R531 Weakness: Secondary | ICD-10-CM | POA: Diagnosis not present

## 2020-03-20 DIAGNOSIS — E1165 Type 2 diabetes mellitus with hyperglycemia: Secondary | ICD-10-CM | POA: Diagnosis not present

## 2020-03-20 DIAGNOSIS — R918 Other nonspecific abnormal finding of lung field: Secondary | ICD-10-CM | POA: Diagnosis not present

## 2020-03-20 DIAGNOSIS — N184 Chronic kidney disease, stage 4 (severe): Secondary | ICD-10-CM | POA: Diagnosis not present

## 2020-03-20 DIAGNOSIS — H538 Other visual disturbances: Secondary | ICD-10-CM | POA: Diagnosis not present

## 2020-03-20 DIAGNOSIS — Z981 Arthrodesis status: Secondary | ICD-10-CM | POA: Diagnosis not present

## 2020-03-20 DIAGNOSIS — R519 Headache, unspecified: Secondary | ICD-10-CM | POA: Diagnosis not present

## 2020-03-20 DIAGNOSIS — N401 Enlarged prostate with lower urinary tract symptoms: Secondary | ICD-10-CM | POA: Diagnosis not present

## 2020-03-20 DIAGNOSIS — J439 Emphysema, unspecified: Secondary | ICD-10-CM | POA: Diagnosis not present

## 2020-03-20 DIAGNOSIS — E039 Hypothyroidism, unspecified: Secondary | ICD-10-CM | POA: Diagnosis not present

## 2020-03-20 DIAGNOSIS — I2510 Atherosclerotic heart disease of native coronary artery without angina pectoris: Secondary | ICD-10-CM | POA: Diagnosis not present

## 2020-03-20 DIAGNOSIS — R0602 Shortness of breath: Secondary | ICD-10-CM | POA: Diagnosis not present

## 2020-03-23 DIAGNOSIS — R8290 Unspecified abnormal findings in urine: Secondary | ICD-10-CM | POA: Diagnosis not present

## 2020-03-23 DIAGNOSIS — E1165 Type 2 diabetes mellitus with hyperglycemia: Secondary | ICD-10-CM | POA: Diagnosis not present

## 2020-03-24 DIAGNOSIS — N1330 Unspecified hydronephrosis: Secondary | ICD-10-CM | POA: Diagnosis not present

## 2020-03-24 DIAGNOSIS — N281 Cyst of kidney, acquired: Secondary | ICD-10-CM | POA: Diagnosis not present

## 2020-03-24 DIAGNOSIS — N184 Chronic kidney disease, stage 4 (severe): Secondary | ICD-10-CM | POA: Diagnosis not present

## 2020-03-24 DIAGNOSIS — N323 Diverticulum of bladder: Secondary | ICD-10-CM | POA: Diagnosis not present

## 2020-03-30 DIAGNOSIS — N401 Enlarged prostate with lower urinary tract symptoms: Secondary | ICD-10-CM | POA: Diagnosis not present

## 2020-03-30 DIAGNOSIS — R351 Nocturia: Secondary | ICD-10-CM | POA: Diagnosis not present

## 2020-03-30 DIAGNOSIS — R310 Gross hematuria: Secondary | ICD-10-CM | POA: Diagnosis not present

## 2020-03-30 DIAGNOSIS — R339 Retention of urine, unspecified: Secondary | ICD-10-CM | POA: Diagnosis not present

## 2020-04-02 DIAGNOSIS — R350 Frequency of micturition: Secondary | ICD-10-CM | POA: Diagnosis not present

## 2020-04-02 DIAGNOSIS — N3941 Urge incontinence: Secondary | ICD-10-CM | POA: Diagnosis not present

## 2020-04-07 DIAGNOSIS — N401 Enlarged prostate with lower urinary tract symptoms: Secondary | ICD-10-CM | POA: Diagnosis not present

## 2020-04-07 DIAGNOSIS — I152 Hypertension secondary to endocrine disorders: Secondary | ICD-10-CM | POA: Diagnosis not present

## 2020-04-07 DIAGNOSIS — E1159 Type 2 diabetes mellitus with other circulatory complications: Secondary | ICD-10-CM | POA: Diagnosis not present

## 2020-04-07 DIAGNOSIS — R110 Nausea: Secondary | ICD-10-CM | POA: Diagnosis not present

## 2020-04-07 DIAGNOSIS — E785 Hyperlipidemia, unspecified: Secondary | ICD-10-CM | POA: Diagnosis not present

## 2020-04-07 DIAGNOSIS — N184 Chronic kidney disease, stage 4 (severe): Secondary | ICD-10-CM | POA: Diagnosis not present

## 2020-04-07 DIAGNOSIS — E039 Hypothyroidism, unspecified: Secondary | ICD-10-CM | POA: Diagnosis not present

## 2020-04-07 DIAGNOSIS — H538 Other visual disturbances: Secondary | ICD-10-CM | POA: Diagnosis not present

## 2020-04-07 DIAGNOSIS — E1169 Type 2 diabetes mellitus with other specified complication: Secondary | ICD-10-CM | POA: Diagnosis not present

## 2020-04-07 DIAGNOSIS — R8290 Unspecified abnormal findings in urine: Secondary | ICD-10-CM | POA: Diagnosis not present

## 2020-04-07 DIAGNOSIS — R42 Dizziness and giddiness: Secondary | ICD-10-CM | POA: Diagnosis not present

## 2020-04-07 DIAGNOSIS — R339 Retention of urine, unspecified: Secondary | ICD-10-CM | POA: Diagnosis not present

## 2020-04-07 DIAGNOSIS — E1122 Type 2 diabetes mellitus with diabetic chronic kidney disease: Secondary | ICD-10-CM | POA: Diagnosis not present

## 2020-04-07 DIAGNOSIS — R351 Nocturia: Secondary | ICD-10-CM | POA: Diagnosis not present

## 2020-04-07 DIAGNOSIS — J0110 Acute frontal sinusitis, unspecified: Secondary | ICD-10-CM | POA: Diagnosis not present

## 2020-04-23 DIAGNOSIS — I517 Cardiomegaly: Secondary | ICD-10-CM | POA: Diagnosis not present

## 2020-04-23 DIAGNOSIS — Z953 Presence of xenogenic heart valve: Secondary | ICD-10-CM | POA: Diagnosis not present

## 2020-04-23 DIAGNOSIS — I081 Rheumatic disorders of both mitral and tricuspid valves: Secondary | ICD-10-CM | POA: Diagnosis not present

## 2020-04-24 DIAGNOSIS — R3915 Urgency of urination: Secondary | ICD-10-CM | POA: Diagnosis not present

## 2020-04-24 DIAGNOSIS — R350 Frequency of micturition: Secondary | ICD-10-CM | POA: Diagnosis not present

## 2020-05-03 DIAGNOSIS — Z7982 Long term (current) use of aspirin: Secondary | ICD-10-CM | POA: Diagnosis not present

## 2020-05-03 DIAGNOSIS — R079 Chest pain, unspecified: Secondary | ICD-10-CM | POA: Diagnosis not present

## 2020-05-03 DIAGNOSIS — Z9889 Other specified postprocedural states: Secondary | ICD-10-CM | POA: Diagnosis not present

## 2020-05-03 DIAGNOSIS — Z79899 Other long term (current) drug therapy: Secondary | ICD-10-CM | POA: Diagnosis not present

## 2020-05-03 DIAGNOSIS — Z87891 Personal history of nicotine dependence: Secondary | ICD-10-CM | POA: Diagnosis not present

## 2020-05-03 DIAGNOSIS — I10 Essential (primary) hypertension: Secondary | ICD-10-CM | POA: Diagnosis not present

## 2020-05-03 DIAGNOSIS — Z888 Allergy status to other drugs, medicaments and biological substances status: Secondary | ICD-10-CM | POA: Diagnosis not present

## 2020-05-03 DIAGNOSIS — E039 Hypothyroidism, unspecified: Secondary | ICD-10-CM | POA: Diagnosis not present

## 2020-05-03 DIAGNOSIS — Z87442 Personal history of urinary calculi: Secondary | ICD-10-CM | POA: Diagnosis not present

## 2020-05-03 DIAGNOSIS — Z951 Presence of aortocoronary bypass graft: Secondary | ICD-10-CM | POA: Diagnosis not present

## 2020-05-03 DIAGNOSIS — Z8673 Personal history of transient ischemic attack (TIA), and cerebral infarction without residual deficits: Secondary | ICD-10-CM | POA: Diagnosis not present

## 2020-05-03 DIAGNOSIS — I959 Hypotension, unspecified: Secondary | ICD-10-CM | POA: Diagnosis not present

## 2020-05-03 DIAGNOSIS — R918 Other nonspecific abnormal finding of lung field: Secondary | ICD-10-CM | POA: Diagnosis not present

## 2020-05-03 DIAGNOSIS — E119 Type 2 diabetes mellitus without complications: Secondary | ICD-10-CM | POA: Diagnosis not present

## 2020-05-03 DIAGNOSIS — R000 Tachycardia, unspecified: Secondary | ICD-10-CM | POA: Diagnosis not present

## 2020-05-03 DIAGNOSIS — K5900 Constipation, unspecified: Secondary | ICD-10-CM | POA: Diagnosis not present

## 2020-05-03 DIAGNOSIS — K219 Gastro-esophageal reflux disease without esophagitis: Secondary | ICD-10-CM | POA: Diagnosis not present

## 2020-05-03 DIAGNOSIS — Z952 Presence of prosthetic heart valve: Secondary | ICD-10-CM | POA: Diagnosis not present

## 2020-05-03 DIAGNOSIS — I2510 Atherosclerotic heart disease of native coronary artery without angina pectoris: Secondary | ICD-10-CM | POA: Diagnosis not present

## 2020-05-03 DIAGNOSIS — R1084 Generalized abdominal pain: Secondary | ICD-10-CM | POA: Diagnosis not present

## 2020-05-03 DIAGNOSIS — Z86718 Personal history of other venous thrombosis and embolism: Secondary | ICD-10-CM | POA: Diagnosis not present

## 2020-05-03 DIAGNOSIS — E785 Hyperlipidemia, unspecified: Secondary | ICD-10-CM | POA: Diagnosis not present

## 2020-05-13 DIAGNOSIS — I10 Essential (primary) hypertension: Secondary | ICD-10-CM | POA: Diagnosis not present

## 2020-05-13 DIAGNOSIS — E1165 Type 2 diabetes mellitus with hyperglycemia: Secondary | ICD-10-CM | POA: Diagnosis not present

## 2020-05-13 DIAGNOSIS — E039 Hypothyroidism, unspecified: Secondary | ICD-10-CM | POA: Diagnosis not present

## 2020-05-13 DIAGNOSIS — D649 Anemia, unspecified: Secondary | ICD-10-CM | POA: Diagnosis not present

## 2020-05-14 DIAGNOSIS — R350 Frequency of micturition: Secondary | ICD-10-CM | POA: Diagnosis not present

## 2020-05-14 DIAGNOSIS — R3915 Urgency of urination: Secondary | ICD-10-CM | POA: Diagnosis not present

## 2020-05-14 DIAGNOSIS — N3941 Urge incontinence: Secondary | ICD-10-CM | POA: Diagnosis not present

## 2020-05-27 DIAGNOSIS — R339 Retention of urine, unspecified: Secondary | ICD-10-CM | POA: Diagnosis not present

## 2020-05-27 DIAGNOSIS — N3090 Cystitis, unspecified without hematuria: Secondary | ICD-10-CM | POA: Diagnosis not present

## 2020-05-27 DIAGNOSIS — T83518A Infection and inflammatory reaction due to other urinary catheter, initial encounter: Secondary | ICD-10-CM | POA: Diagnosis not present

## 2020-05-28 DIAGNOSIS — R3915 Urgency of urination: Secondary | ICD-10-CM | POA: Diagnosis not present

## 2020-05-29 DIAGNOSIS — H35351 Cystoid macular degeneration, right eye: Secondary | ICD-10-CM | POA: Diagnosis not present

## 2020-05-29 DIAGNOSIS — H26492 Other secondary cataract, left eye: Secondary | ICD-10-CM | POA: Diagnosis not present

## 2020-06-02 DIAGNOSIS — I442 Atrioventricular block, complete: Secondary | ICD-10-CM | POA: Diagnosis not present

## 2020-06-03 DIAGNOSIS — E039 Hypothyroidism, unspecified: Secondary | ICD-10-CM | POA: Diagnosis not present

## 2020-06-03 DIAGNOSIS — N183 Chronic kidney disease, stage 3 unspecified: Secondary | ICD-10-CM | POA: Diagnosis not present

## 2020-06-03 DIAGNOSIS — Z96659 Presence of unspecified artificial knee joint: Secondary | ICD-10-CM | POA: Diagnosis not present

## 2020-06-03 DIAGNOSIS — Z87891 Personal history of nicotine dependence: Secondary | ICD-10-CM | POA: Diagnosis not present

## 2020-06-03 DIAGNOSIS — R338 Other retention of urine: Secondary | ICD-10-CM | POA: Diagnosis not present

## 2020-06-03 DIAGNOSIS — I442 Atrioventricular block, complete: Secondary | ICD-10-CM | POA: Diagnosis not present

## 2020-06-03 DIAGNOSIS — R351 Nocturia: Secondary | ICD-10-CM | POA: Diagnosis not present

## 2020-06-03 DIAGNOSIS — Z952 Presence of prosthetic heart valve: Secondary | ICD-10-CM | POA: Diagnosis not present

## 2020-06-03 DIAGNOSIS — Z950 Presence of cardiac pacemaker: Secondary | ICD-10-CM | POA: Diagnosis not present

## 2020-06-03 DIAGNOSIS — E1122 Type 2 diabetes mellitus with diabetic chronic kidney disease: Secondary | ICD-10-CM | POA: Diagnosis not present

## 2020-06-03 DIAGNOSIS — E1165 Type 2 diabetes mellitus with hyperglycemia: Secondary | ICD-10-CM | POA: Diagnosis not present

## 2020-06-03 DIAGNOSIS — I129 Hypertensive chronic kidney disease with stage 1 through stage 4 chronic kidney disease, or unspecified chronic kidney disease: Secondary | ICD-10-CM | POA: Diagnosis not present

## 2020-06-03 DIAGNOSIS — E785 Hyperlipidemia, unspecified: Secondary | ICD-10-CM | POA: Diagnosis not present

## 2020-06-03 DIAGNOSIS — I25810 Atherosclerosis of coronary artery bypass graft(s) without angina pectoris: Secondary | ICD-10-CM | POA: Diagnosis not present

## 2020-06-03 DIAGNOSIS — R319 Hematuria, unspecified: Secondary | ICD-10-CM | POA: Diagnosis not present

## 2020-06-03 DIAGNOSIS — N401 Enlarged prostate with lower urinary tract symptoms: Secondary | ICD-10-CM | POA: Diagnosis not present

## 2020-06-03 DIAGNOSIS — N138 Other obstructive and reflux uropathy: Secondary | ICD-10-CM | POA: Diagnosis not present

## 2020-06-11 DIAGNOSIS — R310 Gross hematuria: Secondary | ICD-10-CM | POA: Diagnosis not present

## 2020-06-18 DIAGNOSIS — R350 Frequency of micturition: Secondary | ICD-10-CM | POA: Diagnosis not present

## 2020-06-18 DIAGNOSIS — R3915 Urgency of urination: Secondary | ICD-10-CM | POA: Diagnosis not present

## 2020-06-25 DIAGNOSIS — I2510 Atherosclerotic heart disease of native coronary artery without angina pectoris: Secondary | ICD-10-CM | POA: Diagnosis not present

## 2020-06-25 DIAGNOSIS — Z953 Presence of xenogenic heart valve: Secondary | ICD-10-CM | POA: Diagnosis not present

## 2020-06-25 DIAGNOSIS — I442 Atrioventricular block, complete: Secondary | ICD-10-CM | POA: Diagnosis not present

## 2020-07-02 DIAGNOSIS — R3915 Urgency of urination: Secondary | ICD-10-CM | POA: Diagnosis not present

## 2020-07-02 DIAGNOSIS — R350 Frequency of micturition: Secondary | ICD-10-CM | POA: Diagnosis not present

## 2020-07-15 DIAGNOSIS — R339 Retention of urine, unspecified: Secondary | ICD-10-CM | POA: Diagnosis not present

## 2020-07-15 DIAGNOSIS — N3090 Cystitis, unspecified without hematuria: Secondary | ICD-10-CM | POA: Diagnosis not present

## 2020-07-16 DIAGNOSIS — R350 Frequency of micturition: Secondary | ICD-10-CM | POA: Diagnosis not present

## 2020-07-16 DIAGNOSIS — R3915 Urgency of urination: Secondary | ICD-10-CM | POA: Diagnosis not present

## 2020-07-21 DIAGNOSIS — Z23 Encounter for immunization: Secondary | ICD-10-CM | POA: Diagnosis not present

## 2020-07-27 DIAGNOSIS — N3090 Cystitis, unspecified without hematuria: Secondary | ICD-10-CM | POA: Diagnosis not present

## 2020-07-30 DIAGNOSIS — R350 Frequency of micturition: Secondary | ICD-10-CM | POA: Diagnosis not present

## 2020-07-30 DIAGNOSIS — R3915 Urgency of urination: Secondary | ICD-10-CM | POA: Diagnosis not present

## 2020-08-11 DIAGNOSIS — N320 Bladder-neck obstruction: Secondary | ICD-10-CM | POA: Diagnosis not present

## 2020-08-17 DIAGNOSIS — E1169 Type 2 diabetes mellitus with other specified complication: Secondary | ICD-10-CM | POA: Diagnosis not present

## 2020-08-17 DIAGNOSIS — N184 Chronic kidney disease, stage 4 (severe): Secondary | ICD-10-CM | POA: Diagnosis not present

## 2020-08-17 DIAGNOSIS — E039 Hypothyroidism, unspecified: Secondary | ICD-10-CM | POA: Diagnosis not present

## 2020-08-17 DIAGNOSIS — D649 Anemia, unspecified: Secondary | ICD-10-CM | POA: Diagnosis not present

## 2020-08-17 DIAGNOSIS — E785 Hyperlipidemia, unspecified: Secondary | ICD-10-CM | POA: Diagnosis not present

## 2020-08-17 DIAGNOSIS — E1122 Type 2 diabetes mellitus with diabetic chronic kidney disease: Secondary | ICD-10-CM | POA: Diagnosis not present

## 2020-08-20 DIAGNOSIS — R3915 Urgency of urination: Secondary | ICD-10-CM | POA: Diagnosis not present

## 2020-08-20 DIAGNOSIS — R350 Frequency of micturition: Secondary | ICD-10-CM | POA: Diagnosis not present

## 2020-08-24 DIAGNOSIS — E1169 Type 2 diabetes mellitus with other specified complication: Secondary | ICD-10-CM | POA: Diagnosis not present

## 2020-08-24 DIAGNOSIS — N184 Chronic kidney disease, stage 4 (severe): Secondary | ICD-10-CM | POA: Diagnosis not present

## 2020-08-24 DIAGNOSIS — Z950 Presence of cardiac pacemaker: Secondary | ICD-10-CM | POA: Diagnosis not present

## 2020-08-24 DIAGNOSIS — Z953 Presence of xenogenic heart valve: Secondary | ICD-10-CM | POA: Diagnosis not present

## 2020-08-24 DIAGNOSIS — E785 Hyperlipidemia, unspecified: Secondary | ICD-10-CM | POA: Diagnosis not present

## 2020-08-24 DIAGNOSIS — I152 Hypertension secondary to endocrine disorders: Secondary | ICD-10-CM | POA: Diagnosis not present

## 2020-08-24 DIAGNOSIS — E039 Hypothyroidism, unspecified: Secondary | ICD-10-CM | POA: Diagnosis not present

## 2020-08-24 DIAGNOSIS — E1122 Type 2 diabetes mellitus with diabetic chronic kidney disease: Secondary | ICD-10-CM | POA: Diagnosis not present

## 2020-08-24 DIAGNOSIS — Z9359 Other cystostomy status: Secondary | ICD-10-CM | POA: Diagnosis not present

## 2020-08-24 DIAGNOSIS — I2510 Atherosclerotic heart disease of native coronary artery without angina pectoris: Secondary | ICD-10-CM | POA: Diagnosis not present

## 2020-08-24 DIAGNOSIS — E1159 Type 2 diabetes mellitus with other circulatory complications: Secondary | ICD-10-CM | POA: Diagnosis not present

## 2020-08-24 DIAGNOSIS — R531 Weakness: Secondary | ICD-10-CM | POA: Diagnosis not present

## 2020-09-02 DIAGNOSIS — Z961 Presence of intraocular lens: Secondary | ICD-10-CM | POA: Diagnosis not present

## 2020-09-02 DIAGNOSIS — H26493 Other secondary cataract, bilateral: Secondary | ICD-10-CM | POA: Diagnosis not present

## 2020-09-02 DIAGNOSIS — H04123 Dry eye syndrome of bilateral lacrimal glands: Secondary | ICD-10-CM | POA: Diagnosis not present

## 2020-09-03 DIAGNOSIS — N3941 Urge incontinence: Secondary | ICD-10-CM | POA: Diagnosis not present

## 2020-09-08 DIAGNOSIS — I442 Atrioventricular block, complete: Secondary | ICD-10-CM | POA: Diagnosis not present

## 2020-09-15 DIAGNOSIS — R339 Retention of urine, unspecified: Secondary | ICD-10-CM | POA: Diagnosis not present

## 2020-09-17 DIAGNOSIS — N3281 Overactive bladder: Secondary | ICD-10-CM | POA: Diagnosis not present

## 2020-09-17 DIAGNOSIS — R338 Other retention of urine: Secondary | ICD-10-CM | POA: Diagnosis not present

## 2020-09-17 DIAGNOSIS — N3941 Urge incontinence: Secondary | ICD-10-CM | POA: Diagnosis not present

## 2020-09-17 DIAGNOSIS — N401 Enlarged prostate with lower urinary tract symptoms: Secondary | ICD-10-CM | POA: Diagnosis not present

## 2020-10-01 DIAGNOSIS — N3941 Urge incontinence: Secondary | ICD-10-CM | POA: Diagnosis not present

## 2020-10-14 DIAGNOSIS — N3941 Urge incontinence: Secondary | ICD-10-CM | POA: Diagnosis not present

## 2020-10-14 DIAGNOSIS — N320 Bladder-neck obstruction: Secondary | ICD-10-CM | POA: Diagnosis not present

## 2020-10-14 DIAGNOSIS — R350 Frequency of micturition: Secondary | ICD-10-CM | POA: Diagnosis not present

## 2020-10-28 DIAGNOSIS — I739 Peripheral vascular disease, unspecified: Secondary | ICD-10-CM | POA: Diagnosis not present

## 2020-10-29 DIAGNOSIS — R350 Frequency of micturition: Secondary | ICD-10-CM | POA: Diagnosis not present

## 2020-10-29 DIAGNOSIS — N3941 Urge incontinence: Secondary | ICD-10-CM | POA: Diagnosis not present

## 2020-11-11 DIAGNOSIS — Z961 Presence of intraocular lens: Secondary | ICD-10-CM | POA: Diagnosis not present

## 2020-11-11 DIAGNOSIS — H04123 Dry eye syndrome of bilateral lacrimal glands: Secondary | ICD-10-CM | POA: Diagnosis not present

## 2020-11-11 DIAGNOSIS — H26491 Other secondary cataract, right eye: Secondary | ICD-10-CM | POA: Diagnosis not present

## 2020-11-11 DIAGNOSIS — Z7952 Long term (current) use of systemic steroids: Secondary | ICD-10-CM | POA: Diagnosis not present

## 2020-11-11 DIAGNOSIS — Z9889 Other specified postprocedural states: Secondary | ICD-10-CM | POA: Diagnosis not present

## 2020-11-12 DIAGNOSIS — R350 Frequency of micturition: Secondary | ICD-10-CM | POA: Diagnosis not present

## 2020-11-12 DIAGNOSIS — N3941 Urge incontinence: Secondary | ICD-10-CM | POA: Diagnosis not present

## 2020-11-17 DIAGNOSIS — N184 Chronic kidney disease, stage 4 (severe): Secondary | ICD-10-CM | POA: Diagnosis not present

## 2020-11-17 DIAGNOSIS — E1122 Type 2 diabetes mellitus with diabetic chronic kidney disease: Secondary | ICD-10-CM | POA: Diagnosis not present

## 2020-11-17 DIAGNOSIS — E1159 Type 2 diabetes mellitus with other circulatory complications: Secondary | ICD-10-CM | POA: Diagnosis not present

## 2020-11-17 DIAGNOSIS — E039 Hypothyroidism, unspecified: Secondary | ICD-10-CM | POA: Diagnosis not present

## 2020-11-17 DIAGNOSIS — I152 Hypertension secondary to endocrine disorders: Secondary | ICD-10-CM | POA: Diagnosis not present

## 2020-11-24 DIAGNOSIS — R140 Abdominal distension (gaseous): Secondary | ICD-10-CM | POA: Diagnosis not present

## 2020-11-24 DIAGNOSIS — R109 Unspecified abdominal pain: Secondary | ICD-10-CM | POA: Diagnosis not present

## 2020-11-24 DIAGNOSIS — L97319 Non-pressure chronic ulcer of right ankle with unspecified severity: Secondary | ICD-10-CM | POA: Diagnosis not present

## 2020-11-24 DIAGNOSIS — E039 Hypothyroidism, unspecified: Secondary | ICD-10-CM | POA: Diagnosis not present

## 2020-11-24 DIAGNOSIS — K8020 Calculus of gallbladder without cholecystitis without obstruction: Secondary | ICD-10-CM | POA: Diagnosis not present

## 2020-11-24 DIAGNOSIS — D3501 Benign neoplasm of right adrenal gland: Secondary | ICD-10-CM | POA: Diagnosis not present

## 2020-11-24 DIAGNOSIS — K7689 Other specified diseases of liver: Secondary | ICD-10-CM | POA: Diagnosis not present

## 2020-11-24 DIAGNOSIS — E785 Hyperlipidemia, unspecified: Secondary | ICD-10-CM | POA: Diagnosis not present

## 2020-11-24 DIAGNOSIS — I2510 Atherosclerotic heart disease of native coronary artery without angina pectoris: Secondary | ICD-10-CM | POA: Diagnosis not present

## 2020-11-24 DIAGNOSIS — I152 Hypertension secondary to endocrine disorders: Secondary | ICD-10-CM | POA: Diagnosis not present

## 2020-11-24 DIAGNOSIS — E1151 Type 2 diabetes mellitus with diabetic peripheral angiopathy without gangrene: Secondary | ICD-10-CM | POA: Diagnosis not present

## 2020-11-24 DIAGNOSIS — N184 Chronic kidney disease, stage 4 (severe): Secondary | ICD-10-CM | POA: Diagnosis not present

## 2020-11-24 DIAGNOSIS — I442 Atrioventricular block, complete: Secondary | ICD-10-CM | POA: Diagnosis not present

## 2020-11-24 DIAGNOSIS — Z9359 Other cystostomy status: Secondary | ICD-10-CM | POA: Diagnosis not present

## 2020-11-24 DIAGNOSIS — K862 Cyst of pancreas: Secondary | ICD-10-CM | POA: Diagnosis not present

## 2020-11-24 DIAGNOSIS — E1122 Type 2 diabetes mellitus with diabetic chronic kidney disease: Secondary | ICD-10-CM | POA: Diagnosis not present

## 2020-11-26 DIAGNOSIS — R3915 Urgency of urination: Secondary | ICD-10-CM | POA: Diagnosis not present

## 2020-11-26 DIAGNOSIS — R350 Frequency of micturition: Secondary | ICD-10-CM | POA: Diagnosis not present

## 2020-12-09 DIAGNOSIS — H04123 Dry eye syndrome of bilateral lacrimal glands: Secondary | ICD-10-CM | POA: Diagnosis not present

## 2020-12-09 DIAGNOSIS — H1045 Other chronic allergic conjunctivitis: Secondary | ICD-10-CM | POA: Diagnosis not present

## 2020-12-09 DIAGNOSIS — Z9841 Cataract extraction status, right eye: Secondary | ICD-10-CM | POA: Diagnosis not present

## 2020-12-09 DIAGNOSIS — H02125 Mechanical ectropion of left lower eyelid: Secondary | ICD-10-CM | POA: Diagnosis not present

## 2020-12-09 DIAGNOSIS — H02122 Mechanical ectropion of right lower eyelid: Secondary | ICD-10-CM | POA: Diagnosis not present

## 2020-12-09 DIAGNOSIS — Z961 Presence of intraocular lens: Secondary | ICD-10-CM | POA: Diagnosis not present

## 2020-12-09 DIAGNOSIS — Z48810 Encounter for surgical aftercare following surgery on the sense organs: Secondary | ICD-10-CM | POA: Diagnosis not present

## 2020-12-09 DIAGNOSIS — Z9842 Cataract extraction status, left eye: Secondary | ICD-10-CM | POA: Diagnosis not present

## 2020-12-09 DIAGNOSIS — Z9889 Other specified postprocedural states: Secondary | ICD-10-CM | POA: Diagnosis not present

## 2020-12-10 DIAGNOSIS — R338 Other retention of urine: Secondary | ICD-10-CM | POA: Diagnosis not present

## 2020-12-10 DIAGNOSIS — R3915 Urgency of urination: Secondary | ICD-10-CM | POA: Diagnosis not present

## 2020-12-10 DIAGNOSIS — R350 Frequency of micturition: Secondary | ICD-10-CM | POA: Diagnosis not present

## 2020-12-10 DIAGNOSIS — N3941 Urge incontinence: Secondary | ICD-10-CM | POA: Diagnosis not present

## 2020-12-17 DIAGNOSIS — I442 Atrioventricular block, complete: Secondary | ICD-10-CM | POA: Diagnosis not present

## 2020-12-24 DIAGNOSIS — R350 Frequency of micturition: Secondary | ICD-10-CM | POA: Diagnosis not present

## 2020-12-28 DIAGNOSIS — L89512 Pressure ulcer of right ankle, stage 2: Secondary | ICD-10-CM | POA: Diagnosis not present

## 2020-12-28 DIAGNOSIS — E1122 Type 2 diabetes mellitus with diabetic chronic kidney disease: Secondary | ICD-10-CM | POA: Diagnosis not present

## 2020-12-28 DIAGNOSIS — N184 Chronic kidney disease, stage 4 (severe): Secondary | ICD-10-CM | POA: Diagnosis not present

## 2021-01-07 DIAGNOSIS — R3915 Urgency of urination: Secondary | ICD-10-CM | POA: Diagnosis not present

## 2021-01-07 DIAGNOSIS — R350 Frequency of micturition: Secondary | ICD-10-CM | POA: Diagnosis not present

## 2021-01-07 DIAGNOSIS — N3941 Urge incontinence: Secondary | ICD-10-CM | POA: Diagnosis not present

## 2021-01-07 DIAGNOSIS — R338 Other retention of urine: Secondary | ICD-10-CM | POA: Diagnosis not present

## 2021-01-11 DIAGNOSIS — N184 Chronic kidney disease, stage 4 (severe): Secondary | ICD-10-CM | POA: Diagnosis not present

## 2021-01-11 DIAGNOSIS — E1122 Type 2 diabetes mellitus with diabetic chronic kidney disease: Secondary | ICD-10-CM | POA: Diagnosis not present

## 2021-01-11 DIAGNOSIS — L89512 Pressure ulcer of right ankle, stage 2: Secondary | ICD-10-CM | POA: Diagnosis not present

## 2021-01-21 DIAGNOSIS — R350 Frequency of micturition: Secondary | ICD-10-CM | POA: Diagnosis not present

## 2021-01-21 DIAGNOSIS — R3915 Urgency of urination: Secondary | ICD-10-CM | POA: Diagnosis not present

## 2021-01-25 DIAGNOSIS — N184 Chronic kidney disease, stage 4 (severe): Secondary | ICD-10-CM | POA: Diagnosis not present

## 2021-01-25 DIAGNOSIS — L89512 Pressure ulcer of right ankle, stage 2: Secondary | ICD-10-CM | POA: Diagnosis not present

## 2021-01-25 DIAGNOSIS — E1122 Type 2 diabetes mellitus with diabetic chronic kidney disease: Secondary | ICD-10-CM | POA: Diagnosis not present

## 2021-02-03 DIAGNOSIS — L89512 Pressure ulcer of right ankle, stage 2: Secondary | ICD-10-CM | POA: Diagnosis not present

## 2021-02-04 DIAGNOSIS — N3941 Urge incontinence: Secondary | ICD-10-CM | POA: Diagnosis not present

## 2021-02-04 DIAGNOSIS — N320 Bladder-neck obstruction: Secondary | ICD-10-CM | POA: Diagnosis not present

## 2021-02-08 DIAGNOSIS — E1122 Type 2 diabetes mellitus with diabetic chronic kidney disease: Secondary | ICD-10-CM | POA: Diagnosis not present

## 2021-02-08 DIAGNOSIS — L89512 Pressure ulcer of right ankle, stage 2: Secondary | ICD-10-CM | POA: Diagnosis not present

## 2021-02-08 DIAGNOSIS — N184 Chronic kidney disease, stage 4 (severe): Secondary | ICD-10-CM | POA: Diagnosis not present

## 2021-02-09 DIAGNOSIS — I152 Hypertension secondary to endocrine disorders: Secondary | ICD-10-CM | POA: Diagnosis not present

## 2021-02-09 DIAGNOSIS — E1169 Type 2 diabetes mellitus with other specified complication: Secondary | ICD-10-CM | POA: Diagnosis not present

## 2021-02-09 DIAGNOSIS — D649 Anemia, unspecified: Secondary | ICD-10-CM | POA: Diagnosis not present

## 2021-02-09 DIAGNOSIS — E039 Hypothyroidism, unspecified: Secondary | ICD-10-CM | POA: Diagnosis not present

## 2021-02-09 DIAGNOSIS — E1122 Type 2 diabetes mellitus with diabetic chronic kidney disease: Secondary | ICD-10-CM | POA: Diagnosis not present

## 2021-02-09 DIAGNOSIS — N1832 Chronic kidney disease, stage 3b: Secondary | ICD-10-CM | POA: Diagnosis not present

## 2021-02-09 DIAGNOSIS — N184 Chronic kidney disease, stage 4 (severe): Secondary | ICD-10-CM | POA: Diagnosis not present

## 2021-02-09 DIAGNOSIS — E785 Hyperlipidemia, unspecified: Secondary | ICD-10-CM | POA: Diagnosis not present

## 2021-02-09 DIAGNOSIS — E1159 Type 2 diabetes mellitus with other circulatory complications: Secondary | ICD-10-CM | POA: Diagnosis not present

## 2021-02-09 DIAGNOSIS — E538 Deficiency of other specified B group vitamins: Secondary | ICD-10-CM | POA: Diagnosis not present

## 2021-02-09 DIAGNOSIS — E559 Vitamin D deficiency, unspecified: Secondary | ICD-10-CM | POA: Diagnosis not present

## 2021-02-10 DIAGNOSIS — S31109D Unspecified open wound of abdominal wall, unspecified quadrant without penetration into peritoneal cavity, subsequent encounter: Secondary | ICD-10-CM | POA: Diagnosis not present

## 2021-02-10 DIAGNOSIS — T8131XD Disruption of external operation (surgical) wound, not elsewhere classified, subsequent encounter: Secondary | ICD-10-CM | POA: Diagnosis not present

## 2021-02-16 DIAGNOSIS — E039 Hypothyroidism, unspecified: Secondary | ICD-10-CM | POA: Diagnosis not present

## 2021-02-16 DIAGNOSIS — I152 Hypertension secondary to endocrine disorders: Secondary | ICD-10-CM | POA: Diagnosis not present

## 2021-02-16 DIAGNOSIS — E785 Hyperlipidemia, unspecified: Secondary | ICD-10-CM | POA: Diagnosis not present

## 2021-02-16 DIAGNOSIS — Z953 Presence of xenogenic heart valve: Secondary | ICD-10-CM | POA: Diagnosis not present

## 2021-02-16 DIAGNOSIS — R0602 Shortness of breath: Secondary | ICD-10-CM | POA: Diagnosis not present

## 2021-02-16 DIAGNOSIS — R531 Weakness: Secondary | ICD-10-CM | POA: Diagnosis not present

## 2021-02-16 DIAGNOSIS — E1159 Type 2 diabetes mellitus with other circulatory complications: Secondary | ICD-10-CM | POA: Diagnosis not present

## 2021-02-16 DIAGNOSIS — I2510 Atherosclerotic heart disease of native coronary artery without angina pectoris: Secondary | ICD-10-CM | POA: Diagnosis not present

## 2021-02-16 DIAGNOSIS — D649 Anemia, unspecified: Secondary | ICD-10-CM | POA: Diagnosis not present

## 2021-02-16 DIAGNOSIS — Z9359 Other cystostomy status: Secondary | ICD-10-CM | POA: Diagnosis not present

## 2021-02-16 DIAGNOSIS — Z79899 Other long term (current) drug therapy: Secondary | ICD-10-CM | POA: Diagnosis not present

## 2021-02-16 DIAGNOSIS — N184 Chronic kidney disease, stage 4 (severe): Secondary | ICD-10-CM | POA: Diagnosis not present

## 2021-02-18 DIAGNOSIS — R3915 Urgency of urination: Secondary | ICD-10-CM | POA: Diagnosis not present

## 2021-02-18 DIAGNOSIS — R350 Frequency of micturition: Secondary | ICD-10-CM | POA: Diagnosis not present

## 2021-02-22 DIAGNOSIS — N184 Chronic kidney disease, stage 4 (severe): Secondary | ICD-10-CM | POA: Diagnosis not present

## 2021-02-22 DIAGNOSIS — E1122 Type 2 diabetes mellitus with diabetic chronic kidney disease: Secondary | ICD-10-CM | POA: Diagnosis not present

## 2021-02-22 DIAGNOSIS — L89512 Pressure ulcer of right ankle, stage 2: Secondary | ICD-10-CM | POA: Diagnosis not present

## 2021-03-04 DIAGNOSIS — N320 Bladder-neck obstruction: Secondary | ICD-10-CM | POA: Diagnosis not present

## 2021-03-04 DIAGNOSIS — N3941 Urge incontinence: Secondary | ICD-10-CM | POA: Diagnosis not present

## 2021-03-08 DIAGNOSIS — N184 Chronic kidney disease, stage 4 (severe): Secondary | ICD-10-CM | POA: Diagnosis not present

## 2021-03-08 DIAGNOSIS — E1122 Type 2 diabetes mellitus with diabetic chronic kidney disease: Secondary | ICD-10-CM | POA: Diagnosis not present

## 2021-03-08 DIAGNOSIS — L89512 Pressure ulcer of right ankle, stage 2: Secondary | ICD-10-CM | POA: Diagnosis not present

## 2021-03-09 DIAGNOSIS — I059 Rheumatic mitral valve disease, unspecified: Secondary | ICD-10-CM | POA: Diagnosis not present

## 2021-03-09 DIAGNOSIS — Z953 Presence of xenogenic heart valve: Secondary | ICD-10-CM | POA: Diagnosis not present

## 2021-03-11 DIAGNOSIS — R8279 Other abnormal findings on microbiological examination of urine: Secondary | ICD-10-CM | POA: Diagnosis not present

## 2021-03-11 DIAGNOSIS — N401 Enlarged prostate with lower urinary tract symptoms: Secondary | ICD-10-CM | POA: Diagnosis not present

## 2021-03-11 DIAGNOSIS — N3941 Urge incontinence: Secondary | ICD-10-CM | POA: Diagnosis not present

## 2021-03-17 DIAGNOSIS — T8131XD Disruption of external operation (surgical) wound, not elsewhere classified, subsequent encounter: Secondary | ICD-10-CM | POA: Diagnosis not present

## 2021-03-17 DIAGNOSIS — S31109D Unspecified open wound of abdominal wall, unspecified quadrant without penetration into peritoneal cavity, subsequent encounter: Secondary | ICD-10-CM | POA: Diagnosis not present

## 2021-03-19 DIAGNOSIS — N3941 Urge incontinence: Secondary | ICD-10-CM | POA: Diagnosis not present

## 2021-03-23 DIAGNOSIS — I152 Hypertension secondary to endocrine disorders: Secondary | ICD-10-CM | POA: Diagnosis not present

## 2021-03-23 DIAGNOSIS — R059 Cough, unspecified: Secondary | ICD-10-CM | POA: Diagnosis not present

## 2021-03-23 DIAGNOSIS — J0110 Acute frontal sinusitis, unspecified: Secondary | ICD-10-CM | POA: Diagnosis not present

## 2021-03-23 DIAGNOSIS — N390 Urinary tract infection, site not specified: Secondary | ICD-10-CM | POA: Diagnosis not present

## 2021-03-23 DIAGNOSIS — E785 Hyperlipidemia, unspecified: Secondary | ICD-10-CM | POA: Diagnosis not present

## 2021-03-23 DIAGNOSIS — E1122 Type 2 diabetes mellitus with diabetic chronic kidney disease: Secondary | ICD-10-CM | POA: Diagnosis not present

## 2021-03-23 DIAGNOSIS — R8290 Unspecified abnormal findings in urine: Secondary | ICD-10-CM | POA: Diagnosis not present

## 2021-03-23 DIAGNOSIS — N184 Chronic kidney disease, stage 4 (severe): Secondary | ICD-10-CM | POA: Diagnosis not present

## 2021-03-23 DIAGNOSIS — E1169 Type 2 diabetes mellitus with other specified complication: Secondary | ICD-10-CM | POA: Diagnosis not present

## 2021-03-23 DIAGNOSIS — E1159 Type 2 diabetes mellitus with other circulatory complications: Secondary | ICD-10-CM | POA: Diagnosis not present

## 2021-03-24 DIAGNOSIS — I442 Atrioventricular block, complete: Secondary | ICD-10-CM | POA: Diagnosis not present

## 2021-03-30 DIAGNOSIS — Z953 Presence of xenogenic heart valve: Secondary | ICD-10-CM | POA: Diagnosis not present

## 2021-03-30 DIAGNOSIS — E1169 Type 2 diabetes mellitus with other specified complication: Secondary | ICD-10-CM | POA: Diagnosis not present

## 2021-03-30 DIAGNOSIS — E785 Hyperlipidemia, unspecified: Secondary | ICD-10-CM | POA: Diagnosis not present

## 2021-03-30 DIAGNOSIS — I350 Nonrheumatic aortic (valve) stenosis: Secondary | ICD-10-CM | POA: Diagnosis not present

## 2021-03-30 DIAGNOSIS — I2510 Atherosclerotic heart disease of native coronary artery without angina pectoris: Secondary | ICD-10-CM | POA: Diagnosis not present

## 2021-03-30 DIAGNOSIS — I442 Atrioventricular block, complete: Secondary | ICD-10-CM | POA: Diagnosis not present

## 2021-03-30 DIAGNOSIS — R531 Weakness: Secondary | ICD-10-CM | POA: Diagnosis not present

## 2021-03-30 DIAGNOSIS — Z950 Presence of cardiac pacemaker: Secondary | ICD-10-CM | POA: Diagnosis not present

## 2021-04-01 DIAGNOSIS — R3915 Urgency of urination: Secondary | ICD-10-CM | POA: Diagnosis not present

## 2021-04-01 DIAGNOSIS — R350 Frequency of micturition: Secondary | ICD-10-CM | POA: Diagnosis not present

## 2021-04-01 DIAGNOSIS — R338 Other retention of urine: Secondary | ICD-10-CM | POA: Diagnosis not present

## 2021-04-01 DIAGNOSIS — N3941 Urge incontinence: Secondary | ICD-10-CM | POA: Diagnosis not present

## 2021-04-12 DIAGNOSIS — L89512 Pressure ulcer of right ankle, stage 2: Secondary | ICD-10-CM | POA: Diagnosis not present

## 2021-04-12 DIAGNOSIS — N184 Chronic kidney disease, stage 4 (severe): Secondary | ICD-10-CM | POA: Diagnosis not present

## 2021-04-12 DIAGNOSIS — E1122 Type 2 diabetes mellitus with diabetic chronic kidney disease: Secondary | ICD-10-CM | POA: Diagnosis not present

## 2021-04-14 DIAGNOSIS — L89512 Pressure ulcer of right ankle, stage 2: Secondary | ICD-10-CM | POA: Diagnosis not present

## 2021-04-15 DIAGNOSIS — R3915 Urgency of urination: Secondary | ICD-10-CM | POA: Diagnosis not present

## 2021-04-15 DIAGNOSIS — R350 Frequency of micturition: Secondary | ICD-10-CM | POA: Diagnosis not present

## 2021-04-15 DIAGNOSIS — N3941 Urge incontinence: Secondary | ICD-10-CM | POA: Diagnosis not present

## 2021-04-27 DIAGNOSIS — M6281 Muscle weakness (generalized): Secondary | ICD-10-CM | POA: Diagnosis not present

## 2021-04-27 DIAGNOSIS — R2689 Other abnormalities of gait and mobility: Secondary | ICD-10-CM | POA: Diagnosis not present

## 2021-04-29 DIAGNOSIS — R338 Other retention of urine: Secondary | ICD-10-CM | POA: Diagnosis not present

## 2021-04-30 DIAGNOSIS — M6281 Muscle weakness (generalized): Secondary | ICD-10-CM | POA: Diagnosis not present

## 2021-04-30 DIAGNOSIS — R2689 Other abnormalities of gait and mobility: Secondary | ICD-10-CM | POA: Diagnosis not present

## 2021-05-03 DIAGNOSIS — E1122 Type 2 diabetes mellitus with diabetic chronic kidney disease: Secondary | ICD-10-CM | POA: Diagnosis not present

## 2021-05-03 DIAGNOSIS — L89512 Pressure ulcer of right ankle, stage 2: Secondary | ICD-10-CM | POA: Diagnosis not present

## 2021-05-03 DIAGNOSIS — N184 Chronic kidney disease, stage 4 (severe): Secondary | ICD-10-CM | POA: Diagnosis not present

## 2021-05-03 DIAGNOSIS — H524 Presbyopia: Secondary | ICD-10-CM | POA: Diagnosis not present

## 2021-05-04 DIAGNOSIS — R2689 Other abnormalities of gait and mobility: Secondary | ICD-10-CM | POA: Diagnosis not present

## 2021-05-04 DIAGNOSIS — M6281 Muscle weakness (generalized): Secondary | ICD-10-CM | POA: Diagnosis not present

## 2021-05-07 DIAGNOSIS — R2689 Other abnormalities of gait and mobility: Secondary | ICD-10-CM | POA: Diagnosis not present

## 2021-05-07 DIAGNOSIS — M6281 Muscle weakness (generalized): Secondary | ICD-10-CM | POA: Diagnosis not present

## 2021-05-11 DIAGNOSIS — R2689 Other abnormalities of gait and mobility: Secondary | ICD-10-CM | POA: Diagnosis not present

## 2021-05-11 DIAGNOSIS — M6281 Muscle weakness (generalized): Secondary | ICD-10-CM | POA: Diagnosis not present

## 2021-05-12 DIAGNOSIS — R2689 Other abnormalities of gait and mobility: Secondary | ICD-10-CM | POA: Diagnosis not present

## 2021-05-12 DIAGNOSIS — M6281 Muscle weakness (generalized): Secondary | ICD-10-CM | POA: Diagnosis not present

## 2021-05-14 DIAGNOSIS — E1122 Type 2 diabetes mellitus with diabetic chronic kidney disease: Secondary | ICD-10-CM | POA: Diagnosis not present

## 2021-05-14 DIAGNOSIS — E1159 Type 2 diabetes mellitus with other circulatory complications: Secondary | ICD-10-CM | POA: Diagnosis not present

## 2021-05-14 DIAGNOSIS — D649 Anemia, unspecified: Secondary | ICD-10-CM | POA: Diagnosis not present

## 2021-05-14 DIAGNOSIS — Z79899 Other long term (current) drug therapy: Secondary | ICD-10-CM | POA: Diagnosis not present

## 2021-05-14 DIAGNOSIS — I152 Hypertension secondary to endocrine disorders: Secondary | ICD-10-CM | POA: Diagnosis not present

## 2021-05-14 DIAGNOSIS — N184 Chronic kidney disease, stage 4 (severe): Secondary | ICD-10-CM | POA: Diagnosis not present

## 2021-05-14 DIAGNOSIS — E039 Hypothyroidism, unspecified: Secondary | ICD-10-CM | POA: Diagnosis not present

## 2021-05-17 DIAGNOSIS — L89512 Pressure ulcer of right ankle, stage 2: Secondary | ICD-10-CM | POA: Diagnosis not present

## 2021-05-18 DIAGNOSIS — R2689 Other abnormalities of gait and mobility: Secondary | ICD-10-CM | POA: Diagnosis not present

## 2021-05-18 DIAGNOSIS — M6281 Muscle weakness (generalized): Secondary | ICD-10-CM | POA: Diagnosis not present

## 2021-05-20 DIAGNOSIS — N3941 Urge incontinence: Secondary | ICD-10-CM | POA: Diagnosis not present

## 2021-05-21 DIAGNOSIS — E1169 Type 2 diabetes mellitus with other specified complication: Secondary | ICD-10-CM | POA: Diagnosis not present

## 2021-05-21 DIAGNOSIS — E039 Hypothyroidism, unspecified: Secondary | ICD-10-CM | POA: Diagnosis not present

## 2021-05-21 DIAGNOSIS — Z0000 Encounter for general adult medical examination without abnormal findings: Secondary | ICD-10-CM | POA: Diagnosis not present

## 2021-05-21 DIAGNOSIS — H9193 Unspecified hearing loss, bilateral: Secondary | ICD-10-CM | POA: Diagnosis not present

## 2021-05-24 DIAGNOSIS — E1122 Type 2 diabetes mellitus with diabetic chronic kidney disease: Secondary | ICD-10-CM | POA: Diagnosis not present

## 2021-05-24 DIAGNOSIS — N184 Chronic kidney disease, stage 4 (severe): Secondary | ICD-10-CM | POA: Diagnosis not present

## 2021-05-24 DIAGNOSIS — L89512 Pressure ulcer of right ankle, stage 2: Secondary | ICD-10-CM | POA: Diagnosis not present

## 2021-05-25 DIAGNOSIS — R2689 Other abnormalities of gait and mobility: Secondary | ICD-10-CM | POA: Diagnosis not present

## 2021-05-25 DIAGNOSIS — M6281 Muscle weakness (generalized): Secondary | ICD-10-CM | POA: Diagnosis not present

## 2021-05-27 DIAGNOSIS — N3941 Urge incontinence: Secondary | ICD-10-CM | POA: Diagnosis not present

## 2021-05-27 DIAGNOSIS — R338 Other retention of urine: Secondary | ICD-10-CM | POA: Diagnosis not present

## 2021-05-27 DIAGNOSIS — N401 Enlarged prostate with lower urinary tract symptoms: Secondary | ICD-10-CM | POA: Diagnosis not present

## 2021-05-28 DIAGNOSIS — R2689 Other abnormalities of gait and mobility: Secondary | ICD-10-CM | POA: Diagnosis not present

## 2021-05-28 DIAGNOSIS — M6281 Muscle weakness (generalized): Secondary | ICD-10-CM | POA: Diagnosis not present

## 2021-06-01 DIAGNOSIS — M6281 Muscle weakness (generalized): Secondary | ICD-10-CM | POA: Diagnosis not present

## 2021-06-01 DIAGNOSIS — R2689 Other abnormalities of gait and mobility: Secondary | ICD-10-CM | POA: Diagnosis not present

## 2021-06-04 DIAGNOSIS — M6281 Muscle weakness (generalized): Secondary | ICD-10-CM | POA: Diagnosis not present

## 2021-06-04 DIAGNOSIS — R2689 Other abnormalities of gait and mobility: Secondary | ICD-10-CM | POA: Diagnosis not present

## 2021-06-08 DIAGNOSIS — R2689 Other abnormalities of gait and mobility: Secondary | ICD-10-CM | POA: Diagnosis not present

## 2021-06-08 DIAGNOSIS — M6281 Muscle weakness (generalized): Secondary | ICD-10-CM | POA: Diagnosis not present

## 2021-06-10 DIAGNOSIS — N3941 Urge incontinence: Secondary | ICD-10-CM | POA: Diagnosis not present

## 2021-06-11 DIAGNOSIS — M6281 Muscle weakness (generalized): Secondary | ICD-10-CM | POA: Diagnosis not present

## 2021-06-11 DIAGNOSIS — R2689 Other abnormalities of gait and mobility: Secondary | ICD-10-CM | POA: Diagnosis not present

## 2021-06-15 DIAGNOSIS — M6281 Muscle weakness (generalized): Secondary | ICD-10-CM | POA: Diagnosis not present

## 2021-06-15 DIAGNOSIS — R2689 Other abnormalities of gait and mobility: Secondary | ICD-10-CM | POA: Diagnosis not present

## 2021-06-18 DIAGNOSIS — R2689 Other abnormalities of gait and mobility: Secondary | ICD-10-CM | POA: Diagnosis not present

## 2021-06-18 DIAGNOSIS — M6281 Muscle weakness (generalized): Secondary | ICD-10-CM | POA: Diagnosis not present

## 2021-06-22 DIAGNOSIS — M6281 Muscle weakness (generalized): Secondary | ICD-10-CM | POA: Diagnosis not present

## 2021-06-22 DIAGNOSIS — R2689 Other abnormalities of gait and mobility: Secondary | ICD-10-CM | POA: Diagnosis not present

## 2021-06-23 DIAGNOSIS — E1122 Type 2 diabetes mellitus with diabetic chronic kidney disease: Secondary | ICD-10-CM | POA: Diagnosis not present

## 2021-06-23 DIAGNOSIS — L89512 Pressure ulcer of right ankle, stage 2: Secondary | ICD-10-CM | POA: Diagnosis not present

## 2021-06-23 DIAGNOSIS — N184 Chronic kidney disease, stage 4 (severe): Secondary | ICD-10-CM | POA: Diagnosis not present

## 2021-06-25 DIAGNOSIS — R2689 Other abnormalities of gait and mobility: Secondary | ICD-10-CM | POA: Diagnosis not present

## 2021-06-25 DIAGNOSIS — M6281 Muscle weakness (generalized): Secondary | ICD-10-CM | POA: Diagnosis not present

## 2021-06-29 DIAGNOSIS — Z953 Presence of xenogenic heart valve: Secondary | ICD-10-CM | POA: Diagnosis not present

## 2021-06-29 DIAGNOSIS — M6281 Muscle weakness (generalized): Secondary | ICD-10-CM | POA: Diagnosis not present

## 2021-06-29 DIAGNOSIS — R2689 Other abnormalities of gait and mobility: Secondary | ICD-10-CM | POA: Diagnosis not present

## 2021-06-29 DIAGNOSIS — I442 Atrioventricular block, complete: Secondary | ICD-10-CM | POA: Diagnosis not present

## 2021-06-29 DIAGNOSIS — I2510 Atherosclerotic heart disease of native coronary artery without angina pectoris: Secondary | ICD-10-CM | POA: Diagnosis not present

## 2021-07-02 DIAGNOSIS — M6281 Muscle weakness (generalized): Secondary | ICD-10-CM | POA: Diagnosis not present

## 2021-07-02 DIAGNOSIS — R2689 Other abnormalities of gait and mobility: Secondary | ICD-10-CM | POA: Diagnosis not present

## 2021-07-06 DIAGNOSIS — M6281 Muscle weakness (generalized): Secondary | ICD-10-CM | POA: Diagnosis not present

## 2021-07-06 DIAGNOSIS — R2689 Other abnormalities of gait and mobility: Secondary | ICD-10-CM | POA: Diagnosis not present

## 2021-07-08 DIAGNOSIS — N3941 Urge incontinence: Secondary | ICD-10-CM | POA: Diagnosis not present

## 2021-07-08 DIAGNOSIS — R338 Other retention of urine: Secondary | ICD-10-CM | POA: Diagnosis not present

## 2021-07-08 DIAGNOSIS — R3915 Urgency of urination: Secondary | ICD-10-CM | POA: Diagnosis not present

## 2021-07-08 DIAGNOSIS — R350 Frequency of micturition: Secondary | ICD-10-CM | POA: Diagnosis not present

## 2021-07-09 DIAGNOSIS — M6281 Muscle weakness (generalized): Secondary | ICD-10-CM | POA: Diagnosis not present

## 2021-07-09 DIAGNOSIS — R2689 Other abnormalities of gait and mobility: Secondary | ICD-10-CM | POA: Diagnosis not present

## 2021-07-12 DIAGNOSIS — R2689 Other abnormalities of gait and mobility: Secondary | ICD-10-CM | POA: Diagnosis not present

## 2021-07-12 DIAGNOSIS — M6281 Muscle weakness (generalized): Secondary | ICD-10-CM | POA: Diagnosis not present

## 2021-07-15 DIAGNOSIS — E1122 Type 2 diabetes mellitus with diabetic chronic kidney disease: Secondary | ICD-10-CM | POA: Diagnosis not present

## 2021-07-15 DIAGNOSIS — L89512 Pressure ulcer of right ankle, stage 2: Secondary | ICD-10-CM | POA: Diagnosis not present

## 2021-07-15 DIAGNOSIS — N184 Chronic kidney disease, stage 4 (severe): Secondary | ICD-10-CM | POA: Diagnosis not present

## 2021-07-16 DIAGNOSIS — M6281 Muscle weakness (generalized): Secondary | ICD-10-CM | POA: Diagnosis not present

## 2021-07-16 DIAGNOSIS — R2689 Other abnormalities of gait and mobility: Secondary | ICD-10-CM | POA: Diagnosis not present

## 2021-07-20 DIAGNOSIS — M6281 Muscle weakness (generalized): Secondary | ICD-10-CM | POA: Diagnosis not present

## 2021-07-20 DIAGNOSIS — R2689 Other abnormalities of gait and mobility: Secondary | ICD-10-CM | POA: Diagnosis not present

## 2021-07-23 DIAGNOSIS — R2689 Other abnormalities of gait and mobility: Secondary | ICD-10-CM | POA: Diagnosis not present

## 2021-07-23 DIAGNOSIS — M6281 Muscle weakness (generalized): Secondary | ICD-10-CM | POA: Diagnosis not present

## 2021-07-27 DIAGNOSIS — L89512 Pressure ulcer of right ankle, stage 2: Secondary | ICD-10-CM | POA: Diagnosis not present

## 2021-07-28 DIAGNOSIS — Z23 Encounter for immunization: Secondary | ICD-10-CM | POA: Diagnosis not present

## 2021-07-29 DIAGNOSIS — R2689 Other abnormalities of gait and mobility: Secondary | ICD-10-CM | POA: Diagnosis not present

## 2021-07-29 DIAGNOSIS — M6281 Muscle weakness (generalized): Secondary | ICD-10-CM | POA: Diagnosis not present

## 2021-08-05 DIAGNOSIS — R338 Other retention of urine: Secondary | ICD-10-CM | POA: Diagnosis not present

## 2021-08-05 DIAGNOSIS — N3941 Urge incontinence: Secondary | ICD-10-CM | POA: Diagnosis not present

## 2021-08-06 DIAGNOSIS — R2689 Other abnormalities of gait and mobility: Secondary | ICD-10-CM | POA: Diagnosis not present

## 2021-08-06 DIAGNOSIS — M6281 Muscle weakness (generalized): Secondary | ICD-10-CM | POA: Diagnosis not present

## 2021-08-09 DIAGNOSIS — R2689 Other abnormalities of gait and mobility: Secondary | ICD-10-CM | POA: Diagnosis not present

## 2021-08-09 DIAGNOSIS — M6281 Muscle weakness (generalized): Secondary | ICD-10-CM | POA: Diagnosis not present

## 2021-08-13 DIAGNOSIS — M6281 Muscle weakness (generalized): Secondary | ICD-10-CM | POA: Diagnosis not present

## 2021-08-13 DIAGNOSIS — R2689 Other abnormalities of gait and mobility: Secondary | ICD-10-CM | POA: Diagnosis not present

## 2021-08-16 DIAGNOSIS — L89512 Pressure ulcer of right ankle, stage 2: Secondary | ICD-10-CM | POA: Diagnosis not present

## 2021-08-16 DIAGNOSIS — N184 Chronic kidney disease, stage 4 (severe): Secondary | ICD-10-CM | POA: Diagnosis not present

## 2021-08-16 DIAGNOSIS — E1122 Type 2 diabetes mellitus with diabetic chronic kidney disease: Secondary | ICD-10-CM | POA: Diagnosis not present

## 2021-08-17 DIAGNOSIS — R2689 Other abnormalities of gait and mobility: Secondary | ICD-10-CM | POA: Diagnosis not present

## 2021-08-17 DIAGNOSIS — M6281 Muscle weakness (generalized): Secondary | ICD-10-CM | POA: Diagnosis not present

## 2021-08-20 DIAGNOSIS — R2689 Other abnormalities of gait and mobility: Secondary | ICD-10-CM | POA: Diagnosis not present

## 2021-08-20 DIAGNOSIS — M6281 Muscle weakness (generalized): Secondary | ICD-10-CM | POA: Diagnosis not present

## 2021-08-24 DIAGNOSIS — M6281 Muscle weakness (generalized): Secondary | ICD-10-CM | POA: Diagnosis not present

## 2021-08-24 DIAGNOSIS — R2689 Other abnormalities of gait and mobility: Secondary | ICD-10-CM | POA: Diagnosis not present

## 2021-08-27 DIAGNOSIS — R2689 Other abnormalities of gait and mobility: Secondary | ICD-10-CM | POA: Diagnosis not present

## 2021-08-27 DIAGNOSIS — M6281 Muscle weakness (generalized): Secondary | ICD-10-CM | POA: Diagnosis not present

## 2021-08-31 DIAGNOSIS — R2689 Other abnormalities of gait and mobility: Secondary | ICD-10-CM | POA: Diagnosis not present

## 2021-08-31 DIAGNOSIS — M6281 Muscle weakness (generalized): Secondary | ICD-10-CM | POA: Diagnosis not present

## 2021-09-02 DIAGNOSIS — R338 Other retention of urine: Secondary | ICD-10-CM | POA: Diagnosis not present

## 2021-09-02 DIAGNOSIS — N3941 Urge incontinence: Secondary | ICD-10-CM | POA: Diagnosis not present

## 2021-09-03 DIAGNOSIS — R2689 Other abnormalities of gait and mobility: Secondary | ICD-10-CM | POA: Diagnosis not present

## 2021-09-03 DIAGNOSIS — M6281 Muscle weakness (generalized): Secondary | ICD-10-CM | POA: Diagnosis not present

## 2021-09-07 DIAGNOSIS — M6281 Muscle weakness (generalized): Secondary | ICD-10-CM | POA: Diagnosis not present

## 2021-09-07 DIAGNOSIS — R2689 Other abnormalities of gait and mobility: Secondary | ICD-10-CM | POA: Diagnosis not present

## 2021-09-14 DIAGNOSIS — R2689 Other abnormalities of gait and mobility: Secondary | ICD-10-CM | POA: Diagnosis not present

## 2021-09-14 DIAGNOSIS — M6281 Muscle weakness (generalized): Secondary | ICD-10-CM | POA: Diagnosis not present

## 2021-09-20 DIAGNOSIS — M6281 Muscle weakness (generalized): Secondary | ICD-10-CM | POA: Diagnosis not present

## 2021-09-20 DIAGNOSIS — R2689 Other abnormalities of gait and mobility: Secondary | ICD-10-CM | POA: Diagnosis not present

## 2021-09-29 DIAGNOSIS — E782 Mixed hyperlipidemia: Secondary | ICD-10-CM | POA: Diagnosis not present

## 2021-09-29 DIAGNOSIS — I10 Essential (primary) hypertension: Secondary | ICD-10-CM | POA: Diagnosis not present

## 2021-09-29 DIAGNOSIS — I2510 Atherosclerotic heart disease of native coronary artery without angina pectoris: Secondary | ICD-10-CM | POA: Diagnosis not present

## 2021-09-30 DIAGNOSIS — N3941 Urge incontinence: Secondary | ICD-10-CM | POA: Diagnosis not present

## 2021-09-30 DIAGNOSIS — R338 Other retention of urine: Secondary | ICD-10-CM | POA: Diagnosis not present

## 2021-10-04 DIAGNOSIS — N3281 Overactive bladder: Secondary | ICD-10-CM | POA: Diagnosis not present

## 2021-10-04 DIAGNOSIS — I495 Sick sinus syndrome: Secondary | ICD-10-CM | POA: Diagnosis not present

## 2021-10-05 DIAGNOSIS — E039 Hypothyroidism, unspecified: Secondary | ICD-10-CM | POA: Diagnosis not present

## 2021-10-05 DIAGNOSIS — E1122 Type 2 diabetes mellitus with diabetic chronic kidney disease: Secondary | ICD-10-CM | POA: Diagnosis not present

## 2021-10-05 DIAGNOSIS — E785 Hyperlipidemia, unspecified: Secondary | ICD-10-CM | POA: Diagnosis not present

## 2021-10-05 DIAGNOSIS — D649 Anemia, unspecified: Secondary | ICD-10-CM | POA: Diagnosis not present

## 2021-10-05 DIAGNOSIS — N184 Chronic kidney disease, stage 4 (severe): Secondary | ICD-10-CM | POA: Diagnosis not present

## 2021-10-05 DIAGNOSIS — E1169 Type 2 diabetes mellitus with other specified complication: Secondary | ICD-10-CM | POA: Diagnosis not present

## 2021-10-05 DIAGNOSIS — Z79899 Other long term (current) drug therapy: Secondary | ICD-10-CM | POA: Diagnosis not present

## 2021-10-12 DIAGNOSIS — E1169 Type 2 diabetes mellitus with other specified complication: Secondary | ICD-10-CM | POA: Diagnosis not present

## 2021-10-12 DIAGNOSIS — E039 Hypothyroidism, unspecified: Secondary | ICD-10-CM | POA: Diagnosis not present

## 2021-10-12 DIAGNOSIS — E1159 Type 2 diabetes mellitus with other circulatory complications: Secondary | ICD-10-CM | POA: Diagnosis not present

## 2021-10-12 DIAGNOSIS — H9193 Unspecified hearing loss, bilateral: Secondary | ICD-10-CM | POA: Diagnosis not present

## 2021-10-12 DIAGNOSIS — N401 Enlarged prostate with lower urinary tract symptoms: Secondary | ICD-10-CM | POA: Diagnosis not present

## 2021-10-12 DIAGNOSIS — E785 Hyperlipidemia, unspecified: Secondary | ICD-10-CM | POA: Diagnosis not present

## 2021-10-12 DIAGNOSIS — R351 Nocturia: Secondary | ICD-10-CM | POA: Diagnosis not present

## 2021-10-12 DIAGNOSIS — Z9359 Other cystostomy status: Secondary | ICD-10-CM | POA: Diagnosis not present

## 2021-10-12 DIAGNOSIS — N1832 Chronic kidney disease, stage 3b: Secondary | ICD-10-CM | POA: Diagnosis not present

## 2021-10-12 DIAGNOSIS — I2510 Atherosclerotic heart disease of native coronary artery without angina pectoris: Secondary | ICD-10-CM | POA: Diagnosis not present

## 2021-10-12 DIAGNOSIS — I5032 Chronic diastolic (congestive) heart failure: Secondary | ICD-10-CM | POA: Diagnosis not present

## 2021-10-12 DIAGNOSIS — I152 Hypertension secondary to endocrine disorders: Secondary | ICD-10-CM | POA: Diagnosis not present

## 2021-10-28 DIAGNOSIS — R350 Frequency of micturition: Secondary | ICD-10-CM | POA: Diagnosis not present

## 2021-10-28 DIAGNOSIS — N3941 Urge incontinence: Secondary | ICD-10-CM | POA: Diagnosis not present

## 2021-11-25 DIAGNOSIS — N3941 Urge incontinence: Secondary | ICD-10-CM | POA: Diagnosis not present

## 2021-12-10 DIAGNOSIS — I739 Peripheral vascular disease, unspecified: Secondary | ICD-10-CM | POA: Diagnosis not present

## 2021-12-12 DIAGNOSIS — L89151 Pressure ulcer of sacral region, stage 1: Secondary | ICD-10-CM | POA: Diagnosis not present

## 2021-12-12 DIAGNOSIS — Z7984 Long term (current) use of oral hypoglycemic drugs: Secondary | ICD-10-CM | POA: Diagnosis not present

## 2021-12-12 DIAGNOSIS — N390 Urinary tract infection, site not specified: Secondary | ICD-10-CM | POA: Diagnosis not present

## 2021-12-12 DIAGNOSIS — Z7902 Long term (current) use of antithrombotics/antiplatelets: Secondary | ICD-10-CM | POA: Diagnosis not present

## 2021-12-12 DIAGNOSIS — Z79899 Other long term (current) drug therapy: Secondary | ICD-10-CM | POA: Diagnosis not present

## 2021-12-12 DIAGNOSIS — T83038A Leakage of other indwelling urethral catheter, initial encounter: Secondary | ICD-10-CM | POA: Diagnosis not present

## 2021-12-12 DIAGNOSIS — Z7982 Long term (current) use of aspirin: Secondary | ICD-10-CM | POA: Diagnosis not present

## 2021-12-12 DIAGNOSIS — Z87440 Personal history of urinary (tract) infections: Secondary | ICD-10-CM | POA: Diagnosis not present

## 2021-12-12 DIAGNOSIS — Z960 Presence of urogenital implants: Secondary | ICD-10-CM | POA: Diagnosis not present

## 2021-12-12 DIAGNOSIS — T83010A Breakdown (mechanical) of cystostomy catheter, initial encounter: Secondary | ICD-10-CM | POA: Diagnosis not present

## 2021-12-14 DIAGNOSIS — R338 Other retention of urine: Secondary | ICD-10-CM | POA: Diagnosis not present

## 2021-12-16 DIAGNOSIS — L89512 Pressure ulcer of right ankle, stage 2: Secondary | ICD-10-CM | POA: Diagnosis not present

## 2021-12-16 DIAGNOSIS — L89313 Pressure ulcer of right buttock, stage 3: Secondary | ICD-10-CM | POA: Diagnosis not present

## 2021-12-16 DIAGNOSIS — E1122 Type 2 diabetes mellitus with diabetic chronic kidney disease: Secondary | ICD-10-CM | POA: Diagnosis not present

## 2021-12-16 DIAGNOSIS — N184 Chronic kidney disease, stage 4 (severe): Secondary | ICD-10-CM | POA: Diagnosis not present

## 2021-12-17 DIAGNOSIS — I152 Hypertension secondary to endocrine disorders: Secondary | ICD-10-CM | POA: Diagnosis not present

## 2021-12-17 DIAGNOSIS — I5032 Chronic diastolic (congestive) heart failure: Secondary | ICD-10-CM | POA: Diagnosis not present

## 2021-12-17 DIAGNOSIS — E785 Hyperlipidemia, unspecified: Secondary | ICD-10-CM | POA: Diagnosis not present

## 2021-12-17 DIAGNOSIS — R059 Cough, unspecified: Secondary | ICD-10-CM | POA: Diagnosis not present

## 2021-12-17 DIAGNOSIS — R918 Other nonspecific abnormal finding of lung field: Secondary | ICD-10-CM | POA: Diagnosis not present

## 2021-12-17 DIAGNOSIS — N1832 Chronic kidney disease, stage 3b: Secondary | ICD-10-CM | POA: Diagnosis not present

## 2021-12-17 DIAGNOSIS — I442 Atrioventricular block, complete: Secondary | ICD-10-CM | POA: Diagnosis not present

## 2021-12-17 DIAGNOSIS — E1159 Type 2 diabetes mellitus with other circulatory complications: Secondary | ICD-10-CM | POA: Diagnosis not present

## 2021-12-17 DIAGNOSIS — E039 Hypothyroidism, unspecified: Secondary | ICD-10-CM | POA: Diagnosis not present

## 2021-12-17 DIAGNOSIS — R0602 Shortness of breath: Secondary | ICD-10-CM | POA: Diagnosis not present

## 2021-12-17 DIAGNOSIS — Z9359 Other cystostomy status: Secondary | ICD-10-CM | POA: Diagnosis not present

## 2021-12-17 DIAGNOSIS — Z20828 Contact with and (suspected) exposure to other viral communicable diseases: Secondary | ICD-10-CM | POA: Diagnosis not present

## 2021-12-17 DIAGNOSIS — E1122 Type 2 diabetes mellitus with diabetic chronic kidney disease: Secondary | ICD-10-CM | POA: Diagnosis not present

## 2021-12-22 DIAGNOSIS — R0602 Shortness of breath: Secondary | ICD-10-CM | POA: Diagnosis not present

## 2021-12-23 DIAGNOSIS — N3941 Urge incontinence: Secondary | ICD-10-CM | POA: Diagnosis not present

## 2021-12-24 DIAGNOSIS — Z9181 History of falling: Secondary | ICD-10-CM | POA: Diagnosis not present

## 2021-12-24 DIAGNOSIS — I2510 Atherosclerotic heart disease of native coronary artery without angina pectoris: Secondary | ICD-10-CM | POA: Diagnosis not present

## 2021-12-24 DIAGNOSIS — L89313 Pressure ulcer of right buttock, stage 3: Secondary | ICD-10-CM | POA: Diagnosis not present

## 2021-12-24 DIAGNOSIS — E785 Hyperlipidemia, unspecified: Secondary | ICD-10-CM | POA: Diagnosis not present

## 2021-12-24 DIAGNOSIS — Z950 Presence of cardiac pacemaker: Secondary | ICD-10-CM | POA: Diagnosis not present

## 2021-12-24 DIAGNOSIS — N184 Chronic kidney disease, stage 4 (severe): Secondary | ICD-10-CM | POA: Diagnosis not present

## 2021-12-24 DIAGNOSIS — E1122 Type 2 diabetes mellitus with diabetic chronic kidney disease: Secondary | ICD-10-CM | POA: Diagnosis not present

## 2021-12-24 DIAGNOSIS — I10 Essential (primary) hypertension: Secondary | ICD-10-CM | POA: Diagnosis not present

## 2021-12-24 DIAGNOSIS — Z951 Presence of aortocoronary bypass graft: Secondary | ICD-10-CM | POA: Diagnosis not present

## 2021-12-24 DIAGNOSIS — Z7984 Long term (current) use of oral hypoglycemic drugs: Secondary | ICD-10-CM | POA: Diagnosis not present

## 2021-12-24 DIAGNOSIS — Z952 Presence of prosthetic heart valve: Secondary | ICD-10-CM | POA: Diagnosis not present

## 2021-12-24 DIAGNOSIS — Z435 Encounter for attention to cystostomy: Secondary | ICD-10-CM | POA: Diagnosis not present

## 2021-12-24 DIAGNOSIS — N400 Enlarged prostate without lower urinary tract symptoms: Secondary | ICD-10-CM | POA: Diagnosis not present

## 2021-12-24 DIAGNOSIS — Z7982 Long term (current) use of aspirin: Secondary | ICD-10-CM | POA: Diagnosis not present

## 2022-01-04 DIAGNOSIS — R338 Other retention of urine: Secondary | ICD-10-CM | POA: Diagnosis not present

## 2022-01-04 DIAGNOSIS — T83198A Other mechanical complication of other urinary devices and implants, initial encounter: Secondary | ICD-10-CM | POA: Diagnosis not present

## 2022-01-06 DIAGNOSIS — N184 Chronic kidney disease, stage 4 (severe): Secondary | ICD-10-CM | POA: Diagnosis not present

## 2022-01-06 DIAGNOSIS — E1122 Type 2 diabetes mellitus with diabetic chronic kidney disease: Secondary | ICD-10-CM | POA: Diagnosis not present

## 2022-01-06 DIAGNOSIS — L89313 Pressure ulcer of right buttock, stage 3: Secondary | ICD-10-CM | POA: Diagnosis not present

## 2022-01-11 DIAGNOSIS — I442 Atrioventricular block, complete: Secondary | ICD-10-CM | POA: Diagnosis not present

## 2022-01-19 DIAGNOSIS — R338 Other retention of urine: Secondary | ICD-10-CM | POA: Diagnosis not present

## 2022-01-20 DIAGNOSIS — E1122 Type 2 diabetes mellitus with diabetic chronic kidney disease: Secondary | ICD-10-CM | POA: Diagnosis not present

## 2022-01-20 DIAGNOSIS — N184 Chronic kidney disease, stage 4 (severe): Secondary | ICD-10-CM | POA: Diagnosis not present

## 2022-01-20 DIAGNOSIS — L89313 Pressure ulcer of right buttock, stage 3: Secondary | ICD-10-CM | POA: Diagnosis not present

## 2022-01-28 DIAGNOSIS — E1122 Type 2 diabetes mellitus with diabetic chronic kidney disease: Secondary | ICD-10-CM | POA: Diagnosis not present

## 2022-01-28 DIAGNOSIS — I10 Essential (primary) hypertension: Secondary | ICD-10-CM | POA: Diagnosis not present

## 2022-01-28 DIAGNOSIS — L89313 Pressure ulcer of right buttock, stage 3: Secondary | ICD-10-CM | POA: Diagnosis not present

## 2022-01-28 DIAGNOSIS — Z435 Encounter for attention to cystostomy: Secondary | ICD-10-CM | POA: Diagnosis not present

## 2022-01-28 DIAGNOSIS — Z950 Presence of cardiac pacemaker: Secondary | ICD-10-CM | POA: Diagnosis not present

## 2022-01-28 DIAGNOSIS — Z952 Presence of prosthetic heart valve: Secondary | ICD-10-CM | POA: Diagnosis not present

## 2022-01-28 DIAGNOSIS — Z9181 History of falling: Secondary | ICD-10-CM | POA: Diagnosis not present

## 2022-01-28 DIAGNOSIS — Z951 Presence of aortocoronary bypass graft: Secondary | ICD-10-CM | POA: Diagnosis not present

## 2022-01-28 DIAGNOSIS — N184 Chronic kidney disease, stage 4 (severe): Secondary | ICD-10-CM | POA: Diagnosis not present

## 2022-01-28 DIAGNOSIS — I2510 Atherosclerotic heart disease of native coronary artery without angina pectoris: Secondary | ICD-10-CM | POA: Diagnosis not present

## 2022-01-28 DIAGNOSIS — E785 Hyperlipidemia, unspecified: Secondary | ICD-10-CM | POA: Diagnosis not present

## 2022-01-28 DIAGNOSIS — Z7982 Long term (current) use of aspirin: Secondary | ICD-10-CM | POA: Diagnosis not present

## 2022-01-28 DIAGNOSIS — Z7984 Long term (current) use of oral hypoglycemic drugs: Secondary | ICD-10-CM | POA: Diagnosis not present

## 2022-01-28 DIAGNOSIS — N400 Enlarged prostate without lower urinary tract symptoms: Secondary | ICD-10-CM | POA: Diagnosis not present

## 2022-02-03 DIAGNOSIS — N3941 Urge incontinence: Secondary | ICD-10-CM | POA: Diagnosis not present

## 2022-02-10 DIAGNOSIS — N1832 Chronic kidney disease, stage 3b: Secondary | ICD-10-CM | POA: Diagnosis not present

## 2022-02-10 DIAGNOSIS — E1151 Type 2 diabetes mellitus with diabetic peripheral angiopathy without gangrene: Secondary | ICD-10-CM | POA: Diagnosis not present

## 2022-02-10 DIAGNOSIS — E1169 Type 2 diabetes mellitus with other specified complication: Secondary | ICD-10-CM | POA: Diagnosis not present

## 2022-02-10 DIAGNOSIS — D539 Nutritional anemia, unspecified: Secondary | ICD-10-CM | POA: Diagnosis not present

## 2022-02-10 DIAGNOSIS — I2510 Atherosclerotic heart disease of native coronary artery without angina pectoris: Secondary | ICD-10-CM | POA: Diagnosis not present

## 2022-02-10 DIAGNOSIS — E1122 Type 2 diabetes mellitus with diabetic chronic kidney disease: Secondary | ICD-10-CM | POA: Diagnosis not present

## 2022-02-10 DIAGNOSIS — N184 Chronic kidney disease, stage 4 (severe): Secondary | ICD-10-CM | POA: Diagnosis not present

## 2022-02-10 DIAGNOSIS — E039 Hypothyroidism, unspecified: Secondary | ICD-10-CM | POA: Diagnosis not present

## 2022-02-10 DIAGNOSIS — I442 Atrioventricular block, complete: Secondary | ICD-10-CM | POA: Diagnosis not present

## 2022-02-10 DIAGNOSIS — N401 Enlarged prostate with lower urinary tract symptoms: Secondary | ICD-10-CM | POA: Diagnosis not present

## 2022-02-10 DIAGNOSIS — I152 Hypertension secondary to endocrine disorders: Secondary | ICD-10-CM | POA: Diagnosis not present

## 2022-02-28 DIAGNOSIS — I442 Atrioventricular block, complete: Secondary | ICD-10-CM | POA: Diagnosis not present

## 2022-03-03 DIAGNOSIS — N3941 Urge incontinence: Secondary | ICD-10-CM | POA: Diagnosis not present

## 2022-03-03 DIAGNOSIS — R338 Other retention of urine: Secondary | ICD-10-CM | POA: Diagnosis not present

## 2022-03-31 DIAGNOSIS — R3915 Urgency of urination: Secondary | ICD-10-CM | POA: Diagnosis not present

## 2022-03-31 DIAGNOSIS — N3941 Urge incontinence: Secondary | ICD-10-CM | POA: Diagnosis not present

## 2022-03-31 DIAGNOSIS — R338 Other retention of urine: Secondary | ICD-10-CM | POA: Diagnosis not present

## 2022-03-31 DIAGNOSIS — R350 Frequency of micturition: Secondary | ICD-10-CM | POA: Diagnosis not present

## 2022-04-15 DIAGNOSIS — I442 Atrioventricular block, complete: Secondary | ICD-10-CM | POA: Diagnosis not present

## 2022-04-15 DIAGNOSIS — R54 Age-related physical debility: Secondary | ICD-10-CM | POA: Diagnosis not present

## 2022-04-15 DIAGNOSIS — Z950 Presence of cardiac pacemaker: Secondary | ICD-10-CM | POA: Diagnosis not present

## 2022-04-15 DIAGNOSIS — E1159 Type 2 diabetes mellitus with other circulatory complications: Secondary | ICD-10-CM | POA: Diagnosis not present

## 2022-04-15 DIAGNOSIS — Z953 Presence of xenogenic heart valve: Secondary | ICD-10-CM | POA: Diagnosis not present

## 2022-04-15 DIAGNOSIS — I152 Hypertension secondary to endocrine disorders: Secondary | ICD-10-CM | POA: Diagnosis not present

## 2022-04-15 DIAGNOSIS — I2510 Atherosclerotic heart disease of native coronary artery without angina pectoris: Secondary | ICD-10-CM | POA: Diagnosis not present

## 2022-04-19 DIAGNOSIS — I442 Atrioventricular block, complete: Secondary | ICD-10-CM | POA: Diagnosis not present

## 2022-04-28 DIAGNOSIS — N3941 Urge incontinence: Secondary | ICD-10-CM | POA: Diagnosis not present

## 2022-04-28 DIAGNOSIS — R338 Other retention of urine: Secondary | ICD-10-CM | POA: Diagnosis not present

## 2022-05-17 DIAGNOSIS — H524 Presbyopia: Secondary | ICD-10-CM | POA: Diagnosis not present

## 2022-05-20 DIAGNOSIS — D539 Nutritional anemia, unspecified: Secondary | ICD-10-CM | POA: Diagnosis not present

## 2022-05-20 DIAGNOSIS — E1122 Type 2 diabetes mellitus with diabetic chronic kidney disease: Secondary | ICD-10-CM | POA: Diagnosis not present

## 2022-05-20 DIAGNOSIS — E039 Hypothyroidism, unspecified: Secondary | ICD-10-CM | POA: Diagnosis not present

## 2022-05-20 DIAGNOSIS — N184 Chronic kidney disease, stage 4 (severe): Secondary | ICD-10-CM | POA: Diagnosis not present

## 2022-05-20 DIAGNOSIS — Z79899 Other long term (current) drug therapy: Secondary | ICD-10-CM | POA: Diagnosis not present

## 2022-05-26 DIAGNOSIS — N3941 Urge incontinence: Secondary | ICD-10-CM | POA: Diagnosis not present

## 2022-05-26 DIAGNOSIS — R338 Other retention of urine: Secondary | ICD-10-CM | POA: Diagnosis not present

## 2022-05-27 DIAGNOSIS — Z0000 Encounter for general adult medical examination without abnormal findings: Secondary | ICD-10-CM | POA: Diagnosis not present

## 2022-05-27 DIAGNOSIS — K7689 Other specified diseases of liver: Secondary | ICD-10-CM | POA: Diagnosis not present

## 2022-05-27 DIAGNOSIS — N184 Chronic kidney disease, stage 4 (severe): Secondary | ICD-10-CM | POA: Diagnosis not present

## 2022-05-27 DIAGNOSIS — D539 Nutritional anemia, unspecified: Secondary | ICD-10-CM | POA: Diagnosis not present

## 2022-05-27 DIAGNOSIS — R109 Unspecified abdominal pain: Secondary | ICD-10-CM | POA: Diagnosis not present

## 2022-05-27 DIAGNOSIS — E1159 Type 2 diabetes mellitus with other circulatory complications: Secondary | ICD-10-CM | POA: Diagnosis not present

## 2022-05-27 DIAGNOSIS — Z9359 Other cystostomy status: Secondary | ICD-10-CM | POA: Diagnosis not present

## 2022-05-27 DIAGNOSIS — K8020 Calculus of gallbladder without cholecystitis without obstruction: Secondary | ICD-10-CM | POA: Diagnosis not present

## 2022-05-27 DIAGNOSIS — K862 Cyst of pancreas: Secondary | ICD-10-CM | POA: Diagnosis not present

## 2022-05-27 DIAGNOSIS — K5790 Diverticulosis of intestine, part unspecified, without perforation or abscess without bleeding: Secondary | ICD-10-CM | POA: Diagnosis not present

## 2022-05-27 DIAGNOSIS — R32 Unspecified urinary incontinence: Secondary | ICD-10-CM | POA: Diagnosis not present

## 2022-05-27 DIAGNOSIS — R399 Unspecified symptoms and signs involving the genitourinary system: Secondary | ICD-10-CM | POA: Diagnosis not present

## 2022-05-27 DIAGNOSIS — I152 Hypertension secondary to endocrine disorders: Secondary | ICD-10-CM | POA: Diagnosis not present

## 2022-05-27 DIAGNOSIS — Z950 Presence of cardiac pacemaker: Secondary | ICD-10-CM | POA: Diagnosis not present

## 2022-05-27 DIAGNOSIS — E039 Hypothyroidism, unspecified: Secondary | ICD-10-CM | POA: Diagnosis not present

## 2022-05-27 DIAGNOSIS — E785 Hyperlipidemia, unspecified: Secondary | ICD-10-CM | POA: Diagnosis not present

## 2022-06-23 DIAGNOSIS — R350 Frequency of micturition: Secondary | ICD-10-CM | POA: Diagnosis not present

## 2022-06-23 DIAGNOSIS — R338 Other retention of urine: Secondary | ICD-10-CM | POA: Diagnosis not present

## 2022-06-23 DIAGNOSIS — N3941 Urge incontinence: Secondary | ICD-10-CM | POA: Diagnosis not present

## 2022-07-19 DIAGNOSIS — Z23 Encounter for immunization: Secondary | ICD-10-CM | POA: Diagnosis not present

## 2022-07-21 DIAGNOSIS — N3020 Other chronic cystitis without hematuria: Secondary | ICD-10-CM | POA: Diagnosis not present

## 2022-07-21 DIAGNOSIS — N3941 Urge incontinence: Secondary | ICD-10-CM | POA: Diagnosis not present

## 2022-07-26 DIAGNOSIS — M6281 Muscle weakness (generalized): Secondary | ICD-10-CM | POA: Diagnosis not present

## 2022-07-26 DIAGNOSIS — R2689 Other abnormalities of gait and mobility: Secondary | ICD-10-CM | POA: Diagnosis not present

## 2022-07-26 DIAGNOSIS — I442 Atrioventricular block, complete: Secondary | ICD-10-CM | POA: Diagnosis not present

## 2022-07-29 DIAGNOSIS — R2689 Other abnormalities of gait and mobility: Secondary | ICD-10-CM | POA: Diagnosis not present

## 2022-07-29 DIAGNOSIS — M6281 Muscle weakness (generalized): Secondary | ICD-10-CM | POA: Diagnosis not present

## 2022-08-03 DIAGNOSIS — R2689 Other abnormalities of gait and mobility: Secondary | ICD-10-CM | POA: Diagnosis not present

## 2022-08-03 DIAGNOSIS — M6281 Muscle weakness (generalized): Secondary | ICD-10-CM | POA: Diagnosis not present

## 2022-08-05 DIAGNOSIS — N390 Urinary tract infection, site not specified: Secondary | ICD-10-CM | POA: Diagnosis not present

## 2022-08-05 DIAGNOSIS — X58XXXA Exposure to other specified factors, initial encounter: Secondary | ICD-10-CM | POA: Diagnosis not present

## 2022-08-05 DIAGNOSIS — T83091A Other mechanical complication of indwelling urethral catheter, initial encounter: Secondary | ICD-10-CM | POA: Diagnosis not present

## 2022-08-05 DIAGNOSIS — Y999 Unspecified external cause status: Secondary | ICD-10-CM | POA: Diagnosis not present

## 2022-08-09 DIAGNOSIS — Z9359 Other cystostomy status: Secondary | ICD-10-CM | POA: Diagnosis not present

## 2022-08-09 DIAGNOSIS — T83038A Leakage of other indwelling urethral catheter, initial encounter: Secondary | ICD-10-CM | POA: Diagnosis not present

## 2022-08-16 DIAGNOSIS — M6281 Muscle weakness (generalized): Secondary | ICD-10-CM | POA: Diagnosis not present

## 2022-08-16 DIAGNOSIS — R2689 Other abnormalities of gait and mobility: Secondary | ICD-10-CM | POA: Diagnosis not present

## 2022-08-18 DIAGNOSIS — N3941 Urge incontinence: Secondary | ICD-10-CM | POA: Diagnosis not present

## 2022-08-23 DIAGNOSIS — M6281 Muscle weakness (generalized): Secondary | ICD-10-CM | POA: Diagnosis not present

## 2022-08-23 DIAGNOSIS — R2689 Other abnormalities of gait and mobility: Secondary | ICD-10-CM | POA: Diagnosis not present

## 2022-08-25 DIAGNOSIS — R2689 Other abnormalities of gait and mobility: Secondary | ICD-10-CM | POA: Diagnosis not present

## 2022-08-25 DIAGNOSIS — M6281 Muscle weakness (generalized): Secondary | ICD-10-CM | POA: Diagnosis not present

## 2022-08-26 DIAGNOSIS — R2689 Other abnormalities of gait and mobility: Secondary | ICD-10-CM | POA: Diagnosis not present

## 2022-08-26 DIAGNOSIS — M6281 Muscle weakness (generalized): Secondary | ICD-10-CM | POA: Diagnosis not present

## 2022-08-30 DIAGNOSIS — M6281 Muscle weakness (generalized): Secondary | ICD-10-CM | POA: Diagnosis not present

## 2022-08-30 DIAGNOSIS — R2689 Other abnormalities of gait and mobility: Secondary | ICD-10-CM | POA: Diagnosis not present

## 2022-09-02 DIAGNOSIS — M6281 Muscle weakness (generalized): Secondary | ICD-10-CM | POA: Diagnosis not present

## 2022-09-02 DIAGNOSIS — R2689 Other abnormalities of gait and mobility: Secondary | ICD-10-CM | POA: Diagnosis not present

## 2022-09-06 DIAGNOSIS — M6281 Muscle weakness (generalized): Secondary | ICD-10-CM | POA: Diagnosis not present

## 2022-09-06 DIAGNOSIS — R2689 Other abnormalities of gait and mobility: Secondary | ICD-10-CM | POA: Diagnosis not present

## 2022-09-13 DIAGNOSIS — M6281 Muscle weakness (generalized): Secondary | ICD-10-CM | POA: Diagnosis not present

## 2022-09-13 DIAGNOSIS — R2689 Other abnormalities of gait and mobility: Secondary | ICD-10-CM | POA: Diagnosis not present

## 2022-09-15 DIAGNOSIS — R338 Other retention of urine: Secondary | ICD-10-CM | POA: Diagnosis not present

## 2022-09-15 DIAGNOSIS — N3941 Urge incontinence: Secondary | ICD-10-CM | POA: Diagnosis not present

## 2022-09-16 DIAGNOSIS — M6281 Muscle weakness (generalized): Secondary | ICD-10-CM | POA: Diagnosis not present

## 2022-09-16 DIAGNOSIS — R2689 Other abnormalities of gait and mobility: Secondary | ICD-10-CM | POA: Diagnosis not present

## 2022-09-21 DIAGNOSIS — D539 Nutritional anemia, unspecified: Secondary | ICD-10-CM | POA: Diagnosis not present

## 2022-09-21 DIAGNOSIS — E039 Hypothyroidism, unspecified: Secondary | ICD-10-CM | POA: Diagnosis not present

## 2022-09-21 DIAGNOSIS — E1159 Type 2 diabetes mellitus with other circulatory complications: Secondary | ICD-10-CM | POA: Diagnosis not present

## 2022-09-21 DIAGNOSIS — E785 Hyperlipidemia, unspecified: Secondary | ICD-10-CM | POA: Diagnosis not present

## 2022-09-21 DIAGNOSIS — E1122 Type 2 diabetes mellitus with diabetic chronic kidney disease: Secondary | ICD-10-CM | POA: Diagnosis not present

## 2022-09-21 DIAGNOSIS — E1169 Type 2 diabetes mellitus with other specified complication: Secondary | ICD-10-CM | POA: Diagnosis not present

## 2022-09-21 DIAGNOSIS — Z79899 Other long term (current) drug therapy: Secondary | ICD-10-CM | POA: Diagnosis not present

## 2022-09-21 DIAGNOSIS — I152 Hypertension secondary to endocrine disorders: Secondary | ICD-10-CM | POA: Diagnosis not present

## 2022-09-21 DIAGNOSIS — N184 Chronic kidney disease, stage 4 (severe): Secondary | ICD-10-CM | POA: Diagnosis not present

## 2022-09-28 DIAGNOSIS — Z950 Presence of cardiac pacemaker: Secondary | ICD-10-CM | POA: Diagnosis not present

## 2022-09-28 DIAGNOSIS — Z79899 Other long term (current) drug therapy: Secondary | ICD-10-CM | POA: Diagnosis not present

## 2022-09-28 DIAGNOSIS — I152 Hypertension secondary to endocrine disorders: Secondary | ICD-10-CM | POA: Diagnosis not present

## 2022-09-28 DIAGNOSIS — N401 Enlarged prostate with lower urinary tract symptoms: Secondary | ICD-10-CM | POA: Diagnosis not present

## 2022-09-28 DIAGNOSIS — I2510 Atherosclerotic heart disease of native coronary artery without angina pectoris: Secondary | ICD-10-CM | POA: Diagnosis not present

## 2022-09-28 DIAGNOSIS — E1159 Type 2 diabetes mellitus with other circulatory complications: Secondary | ICD-10-CM | POA: Diagnosis not present

## 2022-09-28 DIAGNOSIS — Z9359 Other cystostomy status: Secondary | ICD-10-CM | POA: Diagnosis not present

## 2022-09-28 DIAGNOSIS — R531 Weakness: Secondary | ICD-10-CM | POA: Diagnosis not present

## 2022-09-28 DIAGNOSIS — R351 Nocturia: Secondary | ICD-10-CM | POA: Diagnosis not present

## 2022-09-28 DIAGNOSIS — N184 Chronic kidney disease, stage 4 (severe): Secondary | ICD-10-CM | POA: Diagnosis not present

## 2022-09-28 DIAGNOSIS — E1169 Type 2 diabetes mellitus with other specified complication: Secondary | ICD-10-CM | POA: Diagnosis not present

## 2022-09-28 DIAGNOSIS — E039 Hypothyroidism, unspecified: Secondary | ICD-10-CM | POA: Diagnosis not present

## 2022-09-28 DIAGNOSIS — E785 Hyperlipidemia, unspecified: Secondary | ICD-10-CM | POA: Diagnosis not present

## 2022-09-28 DIAGNOSIS — D539 Nutritional anemia, unspecified: Secondary | ICD-10-CM | POA: Diagnosis not present

## 2022-09-28 DIAGNOSIS — E1122 Type 2 diabetes mellitus with diabetic chronic kidney disease: Secondary | ICD-10-CM | POA: Diagnosis not present

## 2022-09-28 DIAGNOSIS — Z953 Presence of xenogenic heart valve: Secondary | ICD-10-CM | POA: Diagnosis not present

## 2023-01-30 ENCOUNTER — Other Ambulatory Visit: Payer: Self-pay | Admitting: Urology

## 2023-03-07 ENCOUNTER — Ambulatory Visit: Admit: 2023-03-07 | Payer: Medicare Other | Admitting: Urology

## 2023-03-07 SURGERY — CYSTOSCOPY, WITH BIOPSY
Anesthesia: Choice

## 2023-08-18 DEATH — deceased
